# Patient Record
Sex: Female | Born: 1960 | Race: White | Hispanic: No | Marital: Married | Smoking: Former smoker
Health system: Southern US, Community
[De-identification: ages and names within clinical notes are randomized; demographics above are authoritative.]

## PROBLEM LIST (undated history)

## (undated) DIAGNOSIS — R911 Solitary pulmonary nodule: Secondary | ICD-10-CM

## (undated) DIAGNOSIS — H269 Unspecified cataract: Secondary | ICD-10-CM

## (undated) DIAGNOSIS — H04123 Dry eye syndrome of bilateral lacrimal glands: Secondary | ICD-10-CM

## (undated) DIAGNOSIS — Z9889 Other specified postprocedural states: Secondary | ICD-10-CM

## (undated) DIAGNOSIS — N3941 Urge incontinence: Secondary | ICD-10-CM

## (undated) DIAGNOSIS — E785 Hyperlipidemia, unspecified: Secondary | ICD-10-CM

## (undated) DIAGNOSIS — R350 Frequency of micturition: Secondary | ICD-10-CM

## (undated) DIAGNOSIS — M8580 Other specified disorders of bone density and structure, unspecified site: Secondary | ICD-10-CM

## (undated) DIAGNOSIS — Z8719 Personal history of other diseases of the digestive system: Secondary | ICD-10-CM

## (undated) DIAGNOSIS — R351 Nocturia: Secondary | ICD-10-CM

## (undated) DIAGNOSIS — R200 Anesthesia of skin: Secondary | ICD-10-CM

## (undated) DIAGNOSIS — K219 Gastro-esophageal reflux disease without esophagitis: Secondary | ICD-10-CM

## (undated) DIAGNOSIS — R0789 Other chest pain: Secondary | ICD-10-CM

## (undated) DIAGNOSIS — N3010 Interstitial cystitis (chronic) without hematuria: Secondary | ICD-10-CM

## (undated) DIAGNOSIS — G8929 Other chronic pain: Secondary | ICD-10-CM

## (undated) DIAGNOSIS — R519 Headache, unspecified: Secondary | ICD-10-CM

## (undated) DIAGNOSIS — R51 Headache: Secondary | ICD-10-CM

## (undated) DIAGNOSIS — M5030 Other cervical disc degeneration, unspecified cervical region: Secondary | ICD-10-CM

## (undated) DIAGNOSIS — A048 Other specified bacterial intestinal infections: Secondary | ICD-10-CM

## (undated) DIAGNOSIS — M1990 Unspecified osteoarthritis, unspecified site: Secondary | ICD-10-CM

## (undated) HISTORY — DX: Personal history of other diseases of the digestive system: Z87.19

## (undated) HISTORY — PX: OTHER SURGICAL HISTORY: SHX169

## (undated) HISTORY — DX: Other chest pain: R07.89

## (undated) HISTORY — DX: Unspecified cataract: H26.9

## (undated) HISTORY — DX: Other specified bacterial intestinal infections: A04.8

## (undated) HISTORY — PX: EXCISION NEUROMA: SHX6350

## (undated) HISTORY — PX: COLONOSCOPY: SHX174

## (undated) HISTORY — DX: Other specified disorders of bone density and structure, unspecified site: M85.80

## (undated) HISTORY — PX: TOE FUSION: SHX1070

---

## 1979-09-11 HISTORY — PX: TONSILLECTOMY: SUR1361

## 1990-09-10 HISTORY — PX: VAGINAL HYSTERECTOMY: SUR661

## 1997-12-31 ENCOUNTER — Encounter: Payer: Self-pay | Admitting: Gastroenterology

## 1998-12-11 ENCOUNTER — Emergency Department (HOSPITAL_COMMUNITY): Admission: EM | Admit: 1998-12-11 | Discharge: 1998-12-11 | Payer: Self-pay | Admitting: *Deleted

## 1998-12-11 ENCOUNTER — Encounter: Payer: Self-pay | Admitting: *Deleted

## 1998-12-22 ENCOUNTER — Ambulatory Visit (HOSPITAL_COMMUNITY): Admission: RE | Admit: 1998-12-22 | Discharge: 1998-12-22 | Payer: Self-pay | Admitting: Obstetrics and Gynecology

## 1999-09-11 HISTORY — PX: ROTATOR CUFF REPAIR: SHX139

## 2000-03-06 ENCOUNTER — Encounter: Payer: Self-pay | Admitting: Orthopedic Surgery

## 2000-03-06 ENCOUNTER — Ambulatory Visit (HOSPITAL_COMMUNITY): Admission: RE | Admit: 2000-03-06 | Discharge: 2000-03-06 | Payer: Self-pay | Admitting: Orthopedic Surgery

## 2002-05-14 ENCOUNTER — Encounter: Payer: Self-pay | Admitting: Neurosurgery

## 2002-05-14 ENCOUNTER — Ambulatory Visit (HOSPITAL_COMMUNITY): Admission: RE | Admit: 2002-05-14 | Discharge: 2002-05-15 | Payer: Self-pay | Admitting: Neurosurgery

## 2002-05-14 HISTORY — PX: OTHER SURGICAL HISTORY: SHX169

## 2002-06-16 ENCOUNTER — Encounter: Admission: RE | Admit: 2002-06-16 | Discharge: 2002-06-16 | Payer: Self-pay | Admitting: Neurosurgery

## 2002-06-16 ENCOUNTER — Encounter: Payer: Self-pay | Admitting: Neurosurgery

## 2002-09-21 ENCOUNTER — Encounter: Payer: Self-pay | Admitting: Neurosurgery

## 2002-09-21 ENCOUNTER — Encounter: Admission: RE | Admit: 2002-09-21 | Discharge: 2002-09-21 | Payer: Self-pay | Admitting: Neurosurgery

## 2003-03-22 ENCOUNTER — Encounter: Payer: Self-pay | Admitting: Neurosurgery

## 2003-03-22 ENCOUNTER — Encounter: Admission: RE | Admit: 2003-03-22 | Discharge: 2003-03-22 | Payer: Self-pay | Admitting: Neurosurgery

## 2004-04-19 ENCOUNTER — Encounter: Admission: RE | Admit: 2004-04-19 | Discharge: 2004-04-19 | Payer: Self-pay | Admitting: Orthopedic Surgery

## 2004-05-04 ENCOUNTER — Encounter: Admission: RE | Admit: 2004-05-04 | Discharge: 2004-05-04 | Payer: Self-pay | Admitting: Orthopedic Surgery

## 2008-02-24 ENCOUNTER — Encounter: Admission: RE | Admit: 2008-02-24 | Discharge: 2008-02-24 | Payer: Self-pay | Admitting: Neurosurgery

## 2008-03-29 ENCOUNTER — Ambulatory Visit (HOSPITAL_COMMUNITY): Admission: RE | Admit: 2008-03-29 | Discharge: 2008-03-30 | Payer: Self-pay | Admitting: Neurosurgery

## 2008-04-13 ENCOUNTER — Encounter: Admission: RE | Admit: 2008-04-13 | Discharge: 2008-04-13 | Payer: Self-pay | Admitting: Neurosurgery

## 2008-05-16 ENCOUNTER — Encounter: Admission: RE | Admit: 2008-05-16 | Discharge: 2008-05-16 | Payer: Self-pay | Admitting: Neurosurgery

## 2008-06-10 HISTORY — PX: OTHER SURGICAL HISTORY: SHX169

## 2009-02-22 ENCOUNTER — Ambulatory Visit: Payer: Self-pay | Admitting: Gastroenterology

## 2009-03-09 ENCOUNTER — Ambulatory Visit: Payer: Self-pay | Admitting: Gastroenterology

## 2009-03-10 ENCOUNTER — Ambulatory Visit (HOSPITAL_BASED_OUTPATIENT_CLINIC_OR_DEPARTMENT_OTHER): Admission: RE | Admit: 2009-03-10 | Discharge: 2009-03-10 | Payer: Self-pay | Admitting: Urology

## 2009-03-10 HISTORY — PX: OTHER SURGICAL HISTORY: SHX169

## 2009-03-22 ENCOUNTER — Encounter: Admission: RE | Admit: 2009-03-22 | Discharge: 2009-03-22 | Payer: Self-pay | Admitting: Neurosurgery

## 2009-06-16 ENCOUNTER — Encounter: Admission: RE | Admit: 2009-06-16 | Discharge: 2009-06-16 | Payer: Self-pay | Admitting: Neurosurgery

## 2009-06-29 ENCOUNTER — Ambulatory Visit (HOSPITAL_COMMUNITY): Admission: RE | Admit: 2009-06-29 | Discharge: 2009-06-30 | Payer: Self-pay | Admitting: Neurosurgery

## 2009-06-29 HISTORY — PX: POSTERIOR FUSION CERVICAL SPINE: SUR628

## 2009-08-25 ENCOUNTER — Encounter: Admission: RE | Admit: 2009-08-25 | Discharge: 2009-08-25 | Payer: Self-pay | Admitting: Neurosurgery

## 2009-08-27 ENCOUNTER — Encounter: Admission: RE | Admit: 2009-08-27 | Discharge: 2009-08-27 | Payer: Self-pay | Admitting: Neurosurgery

## 2009-09-10 DIAGNOSIS — Z8719 Personal history of other diseases of the digestive system: Secondary | ICD-10-CM

## 2009-09-10 HISTORY — DX: Personal history of other diseases of the digestive system: Z87.19

## 2009-09-22 ENCOUNTER — Encounter: Admission: RE | Admit: 2009-09-22 | Discharge: 2009-09-22 | Payer: Self-pay | Admitting: Neurosurgery

## 2009-12-20 ENCOUNTER — Encounter: Admission: RE | Admit: 2009-12-20 | Discharge: 2009-12-20 | Payer: Self-pay | Admitting: Neurosurgery

## 2009-12-23 ENCOUNTER — Encounter: Admission: RE | Admit: 2009-12-23 | Discharge: 2009-12-23 | Payer: Self-pay | Admitting: Neurosurgery

## 2010-01-06 ENCOUNTER — Encounter: Admission: RE | Admit: 2010-01-06 | Discharge: 2010-01-06 | Payer: Self-pay | Admitting: Neurosurgery

## 2010-01-18 ENCOUNTER — Encounter: Admission: RE | Admit: 2010-01-18 | Discharge: 2010-01-18 | Payer: Self-pay | Admitting: Orthopedic Surgery

## 2010-02-08 ENCOUNTER — Encounter: Admission: RE | Admit: 2010-02-08 | Discharge: 2010-02-08 | Payer: Self-pay | Admitting: Neurosurgery

## 2010-04-06 ENCOUNTER — Encounter: Admission: RE | Admit: 2010-04-06 | Discharge: 2010-04-06 | Payer: Self-pay | Admitting: Neurosurgery

## 2010-06-09 ENCOUNTER — Encounter (INDEPENDENT_AMBULATORY_CARE_PROVIDER_SITE_OTHER): Payer: Self-pay | Admitting: *Deleted

## 2010-07-31 ENCOUNTER — Ambulatory Visit: Payer: Self-pay | Admitting: Gastroenterology

## 2010-07-31 DIAGNOSIS — R1310 Dysphagia, unspecified: Secondary | ICD-10-CM | POA: Insufficient documentation

## 2010-07-31 DIAGNOSIS — R1013 Epigastric pain: Secondary | ICD-10-CM

## 2010-08-01 ENCOUNTER — Telehealth: Payer: Self-pay | Admitting: Gastroenterology

## 2010-08-01 ENCOUNTER — Ambulatory Visit: Payer: Self-pay | Admitting: Gastroenterology

## 2010-08-07 ENCOUNTER — Encounter: Payer: Self-pay | Admitting: Gastroenterology

## 2010-10-01 ENCOUNTER — Encounter: Payer: Self-pay | Admitting: Neurosurgery

## 2010-10-05 ENCOUNTER — Ambulatory Visit
Admission: RE | Admit: 2010-10-05 | Discharge: 2010-10-05 | Payer: Self-pay | Source: Home / Self Care | Attending: Gastroenterology | Admitting: Gastroenterology

## 2010-10-10 NOTE — Letter (Signed)
Summary: EGD Instructions  [facility]  [address] [place], [place] [address]   Phone: [phone]  Fax: [phone]       Brenda PRIOLEAU    1961/05/20    MRN: [id number]       Procedure Day /Date:TUESDAY 08/01/2010     Arrival Time: 10:30AM     Procedure Time:11:30AM     Location of Procedure:                    X  [facility] (4th Floor)   PREPARATION FOR ENDOSCOPY/DIL   On11/22/2011 THE DAY OF THE PROCEDURE:  1.   No solid foods, milk or milk products are allowed after midnight the night before your procedure.  2.   Do not drink anything colored red or purple.  Avoid juices with pulp.  No orange juice.  3.  You may drink clear liquids until 9:30AM  which is 2 hours before your procedure.                                                                                                CLEAR LIQUIDS INCLUDE: Water Jello Ice Popsicles Tea (sugar ok, no milk/cream) Powdered fruit flavored drinks Coffee (sugar ok, no milk/cream) Gatorade Juice: apple, white grape, white cranberry  Lemonade Clear bullion, consomm, broth Carbonated beverages (any kind) Strained chicken noodle soup Hard Candy   MEDICATION INSTRUCTIONS  Unless otherwise instructed, you should take regular prescription medications with a small sip of water as early as possible the morning of your procedure.           OTHER INSTRUCTIONS  You will need a responsible adult at least 52 years of age to accompany you and drive you home.   This person must remain in the waiting room during your procedure.  Wear loose fitting clothing that is easily removed.  Leave jewelry and other valuables at home.  However, you may wish to bring a book to read or an iPod/MP3 player to listen to music as you wait for your procedure to start.  Remove all body piercing jewelry and leave at home.  Total time from sign-in until discharge is approximately 2-3 hours.  You should go home  directly after your procedure and rest.  You can resume normal activities the day after your procedure.  The day of your procedure you should not:   Drive   Make legal decisions   Operate machinery   Drink alcohol   Return to work  You will receive specific instructions about eating, activities and medications before you leave.    The above instructions have been reviewed and explained to me by   _______________________    I fully understand and can verbalize these instructions _____________________________ Date _________

## 2010-10-10 NOTE — Progress Notes (Signed)
Summary: Prior Auth.  Phone Note Call from Patient Call back at Home Phone [phone]   Caller: Patient Call For: Dr. Arlyce Dice Reason for Call: Talk to Nurse Summary of Call: Ins. needs prior auth. for her ACIPHEX  Initial call taken by: Karna Christmas,  August 01, 2010 1:52 PM  Follow-up for Phone Call        Insurance will not pay for Aciphex, unless pt has been on Nexium or Omeprazole. Pt states she has tried Nexium in the past but never been on Omeprazole, Will send her in the rx she will try and call us back if it failes. Then we will do preauth for Aciphex Follow-up by: Merri Ray CMA Duncan Dull),  August 01, 2010 2:33 PM    New/Updated Medications: OMEPRAZOLE 40 MG CPDR (OMEPRAZOLE) 1 by mouth once daily Prescriptions: OMEPRAZOLE 40 MG CPDR (OMEPRAZOLE) 1 by mouth once daily  #30 x 2   Entered by:   Merri Ray CMA (AAMA)   Authorized by:   Louis Meckel MD   Signed by:   Merri Ray CMA (AAMA) on 08/01/2010   Method used:   Electronically to        [facility] [address] [phone]* (retail)       [address]       [address]       [place], [place]  [address]       Ph: [phone]       Fax: [phone]   RxID:   [id number]

## 2010-10-10 NOTE — Letter (Signed)
Summary: Results Letter  [facility]  [address] [place], [place] [address]   Phone: [phone]  Fax: [phone]        July 31, 2010 MRN: [id number]    Brenda Gray [address] [place], [place]  [address]    Dear Ms. Yost,  It is my pleasure to have treated you recently as a new patient in my office. I appreciate your confidence and the opportunity to participate in your care.  Since I do have a busy inpatient endoscopy schedule and office schedule, my office hours vary weekly. I am, however, available for emergency calls everyday through my office. If I am not available for an urgent office appointment, another one of our gastroenterologist will be able to assist you.  My well-trained staff are prepared to help you at all times. For emergencies after office hours, a physician from our Gastroenterology section is always available through my 24 hour answering service  Once again I welcome you as a new patient and I look forward to a happy and healthy relationship             Sincerely,  Louis Meckel MD  This letter has been electronically signed by your physician.  Appended Document: Results Letter letter mailed

## 2010-10-10 NOTE — Letter (Signed)
Summary: Results Letter  [facility]  [address] [place], [place] [address]   Phone: [phone]  Fax: [phone]        August 07, 2010 MRN: [id number]    Brenda Gray [address] [place], [place]  [address]    Dear Ms. 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  This is associated with recurrent inflammation of the stomach and duodenum, and recurrent ulcer disease.  My nurse will be calling in a prescription for treatment.            Sincerely,  Louis Meckel MD  This letter has been electronically signed by your physician.  Appended Document: Results Letter letter mailed  Appended Document: Results Letter Called pt to inform Med sent to her pharmacy, Mailed letter   Clinical Lists Changes  Medications: Added new medication of PREVPAC  MISC (AMOXICILL-CLARITHRO-LANSOPRAZ) as directed - Signed Rx of PREVPAC  MISC (AMOXICILL-CLARITHRO-LANSOPRAZ) as directed;  #1 x 0;  Signed;  Entered by: Merri Ray CMA (AAMA);  Authorized by: Louis Meckel MD;  Method used: Electronically to [facility] [address] [phone]*, [address], [place], [place]  [address], Ph: [phone], Fax: [phone]    Prescriptions: PREVPAC  MISC (AMOXICILL-CLARITHRO-LANSOPRAZ) as directed  #1 x 0   Entered by:   Merri Ray CMA (AAMA)   Authorized by:   Louis Meckel MD   Signed by:   Merri Ray CMA (AAMA) on 08/07/2010   Method used:   Electronically to        [facility] [address] [phone]* (retail)       [address]       [address]       [place], [place]  [address]       Ph: [phone]       Fax: [phone]   RxID:   [id number]

## 2010-10-10 NOTE — Letter (Signed)
Summary: New Patient letter  [facility]  [address] [place], [place] [address]   Phone: [phone]  Fax: [phone]       06/09/2010 MRN: [id number]  Brenda Gray [address] [place], [place]  [address]  Dear Brenda Gray,  Welcome to the Gastroenterology Division at [facility]    You are scheduled to see Dr.  Juanda Chance on 07-31-10 at 8:30a.m. on the 3rd floor at [facility], [address]  We ask that you try to arrive at our office 15 minutes prior to your appointment time to allow for check-in.  We would like you to complete the enclosed self-administered evaluation form prior to your visit and bring it with you on the day of your appointment.  We will review it with you.  Also, please bring a complete list of all your medications or, if you prefer, bring the medication bottles and we will list them.  Please bring your insurance card so that we may make a copy of it.  If your insurance requires a referral to see a specialist, please bring your referral form from your primary care physician.  Co-payments are due at the time of your visit and may be paid by cash, check or credit card.     Your office visit will consist of a consult with your physician (includes a physical exam), any laboratory testing he/she may order, scheduling of any necessary diagnostic testing (e.g. x-ray, ultrasound, CT-scan), and scheduling of a procedure (e.g. Endoscopy, Colonoscopy) if required.  Please allow enough time on your schedule to allow for any/all of these possibilities.    If you cannot keep your appointment, please call [phone] to cancel or reschedule prior to your appointment date.  This allows [place] the opportunity to schedule an appointment for another patient in need of care.  If you do not cancel or reschedule by 5 p.m. the business day prior to your appointment date, you will be charged a $50.00 late cancellation/no-show fee.    Thank you for  choosing  Gastroenterology for your medical needs.  We appreciate the opportunity to care for you.  Please visit [place] at our website  to learn more about our practice.                     Sincerely,                                                             The Gastroenterology Division

## 2010-10-10 NOTE — Progress Notes (Signed)
Summary: [facility]   Imported By: Sherian Rein 08/01/2010 07:17:02  _____________________________________________________________________  External Attachment:    Type:   Image     Comment:   External Document

## 2010-10-10 NOTE — Procedures (Signed)
Summary: Upper Endoscopy  Patient: [name] Note: All result statuses are Final unless otherwise noted.  Tests: (1) Upper Endoscopy (EGD)   EGD Upper Endoscopy       DONE     [facility]     [address]     [place], [place]  [address]           ENDOSCOPY PROCEDURE REPORT           PATIENT:  Brenda Gray, Brenda Gray  MR#:  [id number]     BIRTHDATE:  1961-01-08, 49 yrs. old  GENDER:  female           ENDOSCOPIST:  Barbette Hair. Arlyce Dice, MD     Referred by:           PROCEDURE DATE:  08/01/2010     PROCEDURE:  EGD with biopsy, [address], Maloney Dilation of Esophagus     ASA CLASS:  Class I     INDICATIONS:  abdominal pain, dysphagia           MEDICATIONS:   Fentanyl 75 mcg IV, Versed 8 mg IV, glycopyrrolate     (Robinal) 0.2 mg IV, 0.6cc simethancone 0.6 cc PO     TOPICAL ANESTHETIC:  Exactacain Spray           DESCRIPTION OF PROCEDURE:   After the risks benefits and     alternatives of the procedure were thoroughly explained, informed     consent was obtained.  The LB GIF-H180 [id number] endoscope was     introduced through the mouth and advanced to the third portion of     the duodenum, without limitations.  The instrument was slowly     withdrawn as the mucosa was fully examined.     <<PROCEDUREIMAGES>>           Multiple ulcers were found in the antrum. 3 partially healed     superficial prepyloric ulcers with moderate surrounding inflamed     mucosa; bxs taken (see image6 and image7).  Duodenitis was found     (see image4).  irregular Z-line at the gastroesophageal junction     (see image2 and image1). Bxs taken to r/o Barrett's Dilation with     maloney dilator 18mm Minimal resistance; no heme  Otherwise the     examination was normal.    Retroflexed views revealed no     abnormalities.    The scope was then withdrawn from the patient     and the procedure completed.           COMPLICATIONS:  None           ENDOSCOPIC IMPRESSION:     1) Ulcers, multiple in the  antrum     2) Duodenitis     3) Irregular Z-line at the gastroesophageal junction - r/o     Barrett's     4) s/p maloney dilitation for dysphagia     5) Otherwise normal examination     RECOMMENDATIONS:     1) Aciphex 20 mg qd     2) Await biopsy results     3) Call office next 2-3 days to schedule an office appointment     for 1 month           REPEAT EXAM:  No           ______________________________     Barbette Hair. Arlyce Dice, MD           CC:  Jarome Matin, MD  n.     eSIGNED:   Barbette Hair. Amarien Carne at 08/01/2010 12:42 PM           Weir, Rosey Bath, [id number]  Note: An exclamation mark (!) indicates a result that was not dispersed into the flowsheet. Document Creation Date: 08/01/2010 12:42 PM _______________________________________________________________________  (1) Order result status: Final Collection or observation date-time: 08/01/2010 12:35 Requested date-time:  Receipt date-time:  Reported date-time:  Referring Physician:   Ordering Physician: Melvia Heaps [phone]) Specimen Source:  Source: Launa Grill Order Number: [id number] Lab site:

## 2010-10-12 NOTE — Assessment & Plan Note (Signed)
Summary: nausea//gastric problems--ch.   History of Present Illness Visit Type: Initial Visit Primary GI MD: Melvia Heaps MD [facility] Primary Provider: Jarome Matin, MD Chief Complaint: Patient complains that she is having some epigastric pain and nausea. Which has been better lately but she still has it. She is taking Pepcid two a day. The epigastric pain wakes her at night. She complains of her throat closing up but she does have a nodule on her thyroid.  History of Present Illness:   Brenda Gray is a pleasant 50 yo WFreferred request of Dr. Eloise Harman for evaluation of abdominal pain.  For several weeks she has been complaining of upper midepigastric pain that  is slightly lessened with eating.  She has had mild nausea and complains of pyrosis.  She also has dysphagia to solids and liquids.  She is on no gastric irritants including nonsteroidals.  Symptoms have improved though remained despite taking Pepcid twice a day.  She is on no gastric irritants including nonsteroidals.  She status post 2 surgeries on her cervical spine.   GI Review of Systems    Reports abdominal pain, acid reflux, dysphagia with liquids, dysphagia with solids, and  loss of appetite.     Location of  Abdominal pain: epigastric area.    Denies belching, bloating, chest pain, heartburn, nausea, vomiting, vomiting blood, weight loss, and  weight gain.      Reports constipation.     Denies anal fissure, black tarry stools, change in bowel habit, diarrhea, diverticulosis, fecal incontinence, heme positive stool, hemorrhoids, irritable bowel syndrome, jaundice, light color stool, liver problems, rectal bleeding, and  rectal pain. Preventive Screening-Counseling & Management  Alcohol-Tobacco     Smoking Status: quit      Drug Use:  no.      Current Medications (verified): 1)  Pepcid Complete 10-800-165 Mg Chew (Famotidine-Ca Carb-Mag Hydrox) .... Take One By Mouth Two Times A Day 2)  Halcion 0.25 Mg Tabs (Triazolam)  .... Take One By Mouth At Bedtime 3)  Pravastatin .... Take One By Mouth Once Daily 4)  Spinal Injections .... As Needed  Allergies (verified): No Known Drug Allergies  Past History:  Past Medical History: GERD Anemia Arthritis Chronic Headaches Hyperlipidemia Interstitial Cystitis Kidney Stones Urinary Tract Infection Basel Cell  Past Surgical History: Tubal Ligation Oral Surgery anterior cervical diskectomy with fusion posterior cervical diskectomy cages and screws bilateral rotator cuff spinal injections lower  Family History: Family History of Heart Disease: mother No FH of Colon Cancer:  Social History: Occupation: typesetter- Magazine features editor Married one son Patient is a former smoker.  Alcohol Use - no Illicit Drug Use - no Smoking Status:  quit Drug Use:  no  Review of Systems       The patient complains of arthritis/joint pain, back pain, change in vision, headaches-new, sore throat, and thirst - excessive.  The patient denies allergy/sinus, anemia, anxiety-new, blood in urine, breast changes/lumps, confusion, cough, coughing up blood, depression-new, fainting, fatigue, fever, hearing problems, heart murmur, heart rhythm changes, itching, menstrual pain, muscle pains/cramps, night sweats, nosebleeds, pregnancy symptoms, shortness of breath, skin rash, sleeping problems, swelling of feet/legs, swollen lymph glands, thirst - excessive , urination - excessive , urination changes/pain, urine leakage, vision changes, and voice change.         All other systems were reviewed and were negative   Vital Signs:  Patient profile:   50 year old female Height:      65 inches Weight:  129.8 pounds BMI:     21.68 Pulse rate:   76 / minute Pulse rhythm:   regular BP sitting:   114 / 64  (left arm) Cuff size:   regular  Vitals Entered By: Harlow Mares CMA Duncan Dull) (July 31, 2010 8:45 AM)  Physical Exam  Additional Exam:  On physical exam she is a  well-developed well-nourished female  skin: anicteric HEENT: normocephalic; PEERLA; no nasal or pharyngeal abnormalities neck: supple nodes: no cervical lymphadenopathy chest: clear to ausculatation and percussion heart: no murmurs, gallops, or rubs abd: soft, nontender; BS normoactive; no abdominal masses, tenderness, organomegaly rectal: deferred ext: no cynanosis, clubbing, edema skeletal: no deformities neuro: oriented x 3; no focal abnormalities    Impression & Recommendations:  Problem # 1:  ABDOMINAL PAIN, EPIGASTRIC (ICD-789.06)  Symptoms could be due to ulcer or nonulcer dyspepsia.  Recommendations #1 switch from Pepcid to AcipHex 20 mg daily #2 upper endoscopy  Orders: EGD (EGD)  Problem # 2:  DYSPHAGIA UNSPECIFIED (ICD-787.20)  This could be due to a fixed esophageal stricture vs. extrinsic compression related to her prior cervical spine surgeries.  Recommendations #1 upper endoscopy with dilatation as indicated  Risks, alternatives, and complications of the procedure, including bleeding, perforation, and possible need for surgery, were explained to the patient.  Patient's questions were answered.  Orders: EGD (EGD)  Patient Instructions: 1)  Copy sent to : Jarome Matin, MD 2)  Discontinue Pepcid start Aciphex 3)  Conscious Sedation brochure given.  4)  Upper Endoscopy brochure given.  5)  Upper Endoscopy with Dilatation brochure given.  6)  Your EGD is scheduled on 08/01/2010 at 11:30am 7)  The medication list was reviewed and reconciled.  All changed / newly prescribed medications were explained.  A complete medication list was provided to the patient / caregiver. Prescriptions: ACIPHEX 20 MG TBEC (RABEPRAZOLE SODIUM) take one tab daily before breakfast  #30 x 1   Entered and Authorized by:   Louis Meckel MD   Signed by:   Louis Meckel MD on 07/31/2010   Method used:   Electronically to        [facility] [address] [phone]* (retail)        [address]       [address]       [place], [place]  [address]       Ph: [phone]       Fax: [phone]   RxID:   [id number]

## 2010-10-18 NOTE — Assessment & Plan Note (Signed)
Summary: f/u from procedure--ch.   History of Present Illness Visit Type: Follow-up Visit Primary GI MD: Melvia Heaps MD [facility] Primary Provider: Jarome Matin, MD Chief Complaint: Pt still having a few problems , was prescribed Neurontin and Lyrica by another doctor but she hasnt started taking them yet History of Present Illness:   Brenda Gray has returned following  upper endoscopy and dilatation of a distal esophageal stricture.She had multiple superficial ulcers in her gastric antrum and mild duodenitis.  She was treated with a Prevpac for H. pylori.  At this time she is taking only omeprazole.  Except for mild "hunger pains" which is described as a gnawing discomfort she is feeling well.  She has no other specific GI complaints.   GI Review of Systems      Denies abdominal pain, acid reflux, belching, bloating, chest pain, dysphagia with liquids, dysphagia with solids, heartburn, loss of appetite, nausea, vomiting, vomiting blood, weight loss, and  weight gain.        Denies anal fissure, black tarry stools, change in bowel habit, constipation, diarrhea, diverticulosis, fecal incontinence, heme positive stool, hemorrhoids, irritable bowel syndrome, jaundice, light color stool, liver problems, rectal bleeding, and  rectal pain.    Current Medications (verified): 1)  Halcion 0.25 Mg Tabs (Triazolam) .... Take One By Mouth At Bedtime 2)  Pravastatin .... Take One By Mouth Once Daily 3)  Spinal Injections .... As Needed 4)  Omeprazole 40 Mg Cpdr (Omeprazole) .Marland Kitchen.. 1 By Mouth Once Daily 5)  Prevpac  Misc (Amoxicill-Clarithro-Lansopraz) .... As Directed  Allergies (verified): No Known Drug Allergies  Past History:  Past Medical History: Last updated: 07/31/2010 GERD Anemia Arthritis Chronic Headaches Hyperlipidemia Interstitial Cystitis Kidney Stones Urinary Tract Infection Basel Cell  Past Surgical History: Last updated: 07/31/2010 Tubal Ligation Oral  Surgery anterior cervical diskectomy with fusion posterior cervical diskectomy cages and screws bilateral rotator cuff spinal injections lower  Family History: Last updated: 07/31/2010 Family History of Heart Disease: mother No FH of Colon Cancer:  Social History: Last updated: 07/31/2010 Occupation: typesetter- Editor, 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  Alcohol Use - no Illicit Drug Use - no  Review of Systems  The patient denies allergy/sinus, anemia, anxiety-new, arthritis/joint pain, back pain, blood in urine, breast changes/lumps, change in vision, confusion, cough, coughing up blood, depression-new, fainting, fatigue, fever, headaches-new, hearing problems, heart murmur, heart rhythm changes, itching, menstrual pain, muscle pains/cramps, night sweats, nosebleeds, pregnancy symptoms, shortness of breath, skin rash, sleeping problems, sore throat, swelling of feet/legs, swollen lymph glands, thirst - excessive , urination - excessive , urination changes/pain, urine leakage, vision changes, and voice change.    Vital Signs:  Patient profile:   50 year old female Height:      65 inches Weight:      131 pounds BMI:     21.88 BSA:     1.65 Pulse rate:   62 / minute Pulse rhythm:   regular BP sitting:   118 / 72  (left arm)  Vitals Entered By: Merri Ray CMA Duncan Dull) (October 05, 2010 1:54 PM)   Impression & Recommendations:  Problem # 1:  DYSPHAGIA UNSPECIFIED (ICD-787.20) Assessment Improved Repeat Dilatation P.R.N.  Problem # 2:  ABDOMINAL PAIN, EPIGASTRIC (ICD-789.06) Assessment: Improved Plan to discontinue omeprazole.  She will take hyomax estimated for abdominal discomfort.  Patient Instructions: 1)  Copy sent to : Brenda Matin, MD 2)  Your prescription is being sent to your pharmacy today 3)  Call back  as needed  4)  The medication list was reviewed and reconciled.  All changed / newly prescribed medications were explained.  A  complete medication list was provided to the patient / caregiver. Prescriptions: HYOMAX-FT 0.125 MG TBDP (HYOSCYAMINE SULFATE) take 1-2 tabs sublingual q.4 h. p.r.n.  #15 x 1   Entered and Authorized by:   Brenda Meckel MD   Signed by:   Brenda Meckel MD on 10/05/2010   Method used:   Electronically to        [facility] [address] [phone]* (retail)       [address]       [address]       [place], [place]  [address]       Ph: [phone]       Fax: [phone]   RxID:   [id number]

## 2010-12-14 LAB — CBC
HCT: 43.7 % (ref 36.0–46.0)
Hemoglobin: 14.6 g/dL (ref 12.0–15.0)
MCHC: 33.4 g/dL (ref 30.0–36.0)
RBC: 4.93 MIL/uL (ref 3.87–5.11)
RDW: 13.4 % (ref 11.5–15.5)

## 2010-12-14 LAB — BASIC METABOLIC PANEL
CO2: 30 mEq/L (ref 19–32)
Calcium: 10.1 mg/dL (ref 8.4–10.5)
GFR calc Af Amer: >60 mL/min (ref >60)
GFR calc non Af Amer: >60 mL/min (ref >60)
Glucose, Bld: 92 mg/dL (ref 70–99)
Potassium: 4.7 mEq/L (ref 3.5–5.1)
Sodium: 139 mEq/L (ref 135–145)

## 2011-01-23 NOTE — Op Note (Signed)
Brenda Gray, Brenda Gray           ACCOUNT NO.:  [id number]   MEDICAL RECORD NO.:  [id number]          PATIENT TYPE:  AMB   LOCATION:  [facility]                         FACILITY:  [facility]   PHYSICIAN:  Excell Seltzer. Annabell Howells, M.D.    DATE OF BIRTH:  07-20-1961   DATE OF PROCEDURE:  03/10/2009  DATE OF DISCHARGE:                               OPERATIVE REPORT   PROCEDURE:  Cystoscopy, hydrodistention of the bladder, urethral  dilation, installation of Pyridium and Marcaine.   PREOPERATIVE DIAGNOSIS:  Painful bladder with frequency and urgency.   POSTOPERATIVE DIAGNOSIS:  Interstitial cystitis.   SURGEON:  Dr. Bjorn Pippin   ANESTHESIA:  General.   SPECIMEN:  None.   COMPLICATIONS:  None.   INDICATIONS:  [name] is a 50 year old white female with a 2-year  history of urgency and frequency without pain.  She has noted dietary  triggers and it is felt she may have interstitial cystitis.   FINDINGS AT PROCEDURE:  The patient was given Cipro.  She was taken to  the operating room where general anesthetic was induced.  She was placed  in lithotomy position.  Her perineum and genitalia were prepped with  Betadine solution in the usual sterile fashion.  A time-out was  performed.  A cysto was performed with a 22-French scope and 70-degree  lens.  Examination revealed a normal urethra.  The bladder wall had mild  trabeculation and increased vascular density without lesions.  The  ureteral orifices were unremarkable.  After thorough cystoscopy, the  bladder was filled under 80 cm of water pressure to capacity.  She had  some muscular contractions early suggesting a sensitive bladder that  required deepening of the anesthesia.  Eventually, she was filled to a  capacity of 800 mL.  Upon draining the bladder, the terminal efflux was  bloody.  Re-cystoscopy after hydrodistention revealed diffuse severe  glomerulations but no evidence of ulcerations or bladder wall tearing.   At this point, the  urethra was dilated to 30-French with female sounds.  She was little tight at 26.  Once the urethral dilation had been  performed, the bladder was instilled with 30 cc of 0.25% Marcaine with  400 mg crushed Pyridium.  A B and O suppository was placed.  She was  taken down from lithotomy position.  Her anesthetic was reversed.  She was removed to the recovery room in stable condition.  There were no  complications.  She has followup with me on 7/16 and will be given  Cipro, Vicodin, and Pyridium for postoperative management and will be  started on Elavil 10 mg 1-2 tablets at bedtime and Elmiron 100 mg p.o.  t.i.d. for her probable interstitial cystitis.      Excell Seltzer. Annabell Howells, M.D.  Electronically Signed     JJW/MEDQ  D:  03/10/2009  T:  03/10/2009  Job:  [id number]   cc:   Barry Dienes. Eloise Harman, M.D.  Fax: [phone]

## 2011-01-23 NOTE — Op Note (Signed)
NAMELAURANN, MCMORRIS NO.:  [id number]   MEDICAL RECORD NO.:  [id number]          PATIENT TYPE:  OIB   LOCATION:  3599                         FACILITY:  [facility]   PHYSICIAN:  Donalee Citrin, M.D.        DATE OF BIRTH:  09/05/1961   DATE OF PROCEDURE:  03/29/2008  DATE OF DISCHARGE:                               OPERATIVE REPORT   PREOPERATIVE DIAGNOSIS:  Cervical spondylosis with spondylitic  radiculopathy with severe stenosis at C3-4 and C4-5, instability and  spinal deformity at these levels.   PROCEDURE:  1. Anterior cervical diskectomy and fusion at C3-4 and C4-5 using a 5-      mm allograft wedge at C3-4, a 6 mm at C4-5, and a 40-mm Venture      plate with six 09-WJ variable angle screws.  2. Removal of hardware and exploration of fusion at C5-C7 with removal      of Atlantis plate.   SURGEON:  Donalee Citrin, MD   ASSISTANT:  Kathaleen Maser. Pool, MD   ANESTHESIA:  General endotracheal.   HISTORY OF PRESENT ILLNESS:  The patient is a very pleasant 50 year old  female with a progressive worsening neck and predominantly left shoulder  and arm pain that has been getting worse over the last several weeks and  months.  She had undergone previous ACDF at C5-C7 back approximately 5  years ago.  Repeat imaging showed significant kyphosis with kyphotic  deformity overlying the plate at C5 with severe spondylitic compression  of the spinal cord and biforaminal stenosis at C3-4 and C4-5.  The  patient then failed all forms of conservative management but was  recommended re-exploration and fusion at C5-C7, removal of hardware and  ACDF at C3-4 and C4-5.  Risks and benefits of the operation were  explained to the patient.  The patient understood and agreed to proceed  forward.   PROCEDURE IN DETAIL:  The patient was brought to the OR and induced  under general anesthesia, placed in supine, and the neck was flexed in  extension and placed on 5 pounds of halter traction.  The  right side of  her neck was prepped and draped in the usual sterile fashion.  Her old  incision was examined under fluoroscopy and felt to be too low for the  new operation, so an incision was made superior to this.  A  curvilinear  incision was made just off the midline to the anterior border of the  sternocleidomastoid muscle.  The superficial layer of the platysma was  dissected down and divided longitudinally.  The avascular plane between  the sternocleidomastoid and strap muscles was developed down to the  prevertebral fascia.  Prevertebral fascia was then dissected with  Kitners.  The old plate was immediately visualized.  The scar tissue was  dissected off the old plate.  The locking mechanisms were engaged.  The  screws were removed and the plate was removed.  The fusion at C5 and C7  was noted to be solid.  The disk space at C4-5 was overlying the top of  the plate with a large  anterior osteophyte.  This was bitten off with a  Leksell rongeur and annulotomy was made into that disk space.  Then the  vertebral body was burned off to the superior margin of the C3-4 disk  space.  The longus colli was then reflected laterally and the disk space  was incised here.  Self-retaining retractor was placed and was cleaned  out further with 3-mm Kerrison punch removing anterior osteophytes.  A  high-speed drill was used to drill down the disk space to posterior  annulus and osteophyte complexes.  At this point, the operating  microscope was draped and brought into the field.  Under microscopic  illumination, the inner surface of the endplates of both C4 and C5 were  aggressively underbitten and removing the posterior margin of the  annulus.  There was remarked osteophytic compression of the spinal cord  and biforaminal stenosis predominantly at C4 vertebral body.  This was  aggressively underbitten decompressing the central canal.  Both C5  pedicles were palpated.  There was a very large bony  spur on the left  side of the spinal canal causing proximal compression of the left C5  nerve root.  This was all aggressively underbitten until the C5 nerve  was skeletonized and flushed with a pedicle and decompressed.  Then the  endplates were scraped.  Gelfoam was placed.  Attention was taken to C3-  4.  After the right C5 nerve root was also skeletonized and decompressed  at C3-4 in similar fashion, central decompression was begun with  aggressive underbiting of both C3 and C4.  The PLL was removed in a  piecemeal fashion.  Aggressive underbiting of both endplates  decompressed the central canal.  Both proximal C4 pedicles were  identified and both C4 nerve roots were skeletonized first with a  pedicle.  After adequate decompression had been achieved and explored  with an angled nerve hook and aggressive underbiting of both posterior  bone spurs were achieved, this endplate was scraped and a 5-mm graft was  sized and selected and inserted 1-mm deep to the anterior vertebral line  and 6-mm graft was then inserted at C4-5.  Then a Venture plate was  placed, sized up to a 40, old screw holes were used, and five with two  13-mm variable angle screws were placed.  All screws had excellent  purchase.  Locking mechanism was engaged.  Then new screw was replaced  in C3 and C4.  All screws were again engaged and had excellent purchase.  Meticulous hemostasis was maintained.  A drain was placed to the  extensive dissection approximately across the 4 cervical level and the  platysma was reapproximated with interrupted Vicryl and the skin was  closed with running 4-0 subcuticular.  Benzoin and Steri-Strips were  applied.  The patient went to the recovery room in stable condition.  At  the end of the case, needle, sponge, and instrument counts were correct.           ______________________________  Donalee Citrin, M.D.     GC/MEDQ  D:  03/29/2008  T:  03/30/2008  Job:  [id number]

## 2011-01-26 NOTE — Op Note (Signed)
NAME:  Brenda Gray, Brenda Gray                     ACCOUNT NO.:  [id number]   MEDICAL RECORD NO.:  [id number]                   PATIENT TYPE:  OIB   LOCATION:  3008                                 FACILITY:  [facility]   PHYSICIAN:  Donzetta Sprung. Wynetta Emery, M.D.                  DATE OF BIRTH:  05-14-1961   DATE OF PROCEDURE:  05/14/2002  DATE OF DISCHARGE:                                 OPERATIVE REPORT   PREOPERATIVE DIAGNOSIS:  Cervical spondylosis with C6-C7 radiculopathy, left  greater than right.   OPERATION:  Endocervical diskectomy with fusion at C5-6 and C6-7 with  patellar wedge 6 mm at C5-6 and 7 mm at C607, 40 mm Atlantis plate with six  13 mm screws.   SURGEON:  Donzetta Sprung. Wynetta Emery, M.D.   ASSISTANT:  Reinaldo Meeker, M.D.   ANESTHESIA:  General endotracheal.   HISTORY OF PRESENT ILLNESS:  The patient is a 50 year old female who had  long standing neck and left greater than right arm pain that has been  refractive to conservative treatment with physical therapy and anti-  inflammatories and had progressive worsening and weakness of her triceps  about 4/5.  CT scan showed severe cervical spondylosis with disc and spur  compression and bulge at nerve roots at C5-6 and C6-7 and bulge at C7 and C6-  7.  The patient was recommended endocervical diskectomy with fusion.   DESCRIPTION OF PROCEDURE:  The patient was brought to the operating room.  General anesthesia.  Supine with a shoulder roll and neck in flexed, and 5  pounds of Holter traction.  The right side of the neck was prepped and  draped in a sterile fashion.  A curvilinear incision was made just out the  midline to sternocleidomastoid after localization with an x-ray.  Superficially the latissimus fascia longitudinally and the  sternocleidomastoid and strap muscles.  The omohyoid was drilled to fascia  ties and Kitners.  Confirmed localization of the C6-7 disc space.  The  longus colli was then reflected laterally at C6-7 and C5-6  with annulotomy  made with 15 blade scalpel at C6-7 to mark the disc space.  Then the  remainder of the longus was reflected, isolated at C5-6 and C6-7 disc space.  The self retaining retractors were placed.  Annulotomy was made both at the  C5-6 and C6-7.  Pituitary rongeurs were used to remove the anterior  vertebral bodies and anterior osteophytes were bitten with Kerrison punch.  Using high speed drill, the end plates were drilled down.  Disc was drilled  out on the posterior osteophyte and posterior longitudinal ligament.  Operating microscope was draped and brought into the field.  Under  microscopic magnification the osteophtye was removed at C5-6 spur.  Large  osteophyte and spur removed from the left and right C6 nerve root,  decompressing both nerves on both sides with angle.  Again with  decompression both nerve  root and neuroforamen were widely patent.  Gelfoam  was laid in the disc space.  Then at C6-7 the procedure was repeated.  The  ligaments were removed in piece meal fashion.  There was very large  centralized as well as hypertrophy compressing the right C6-7 nerve root as  well as left.  Disc are removed and decompressed.  Both neuroforamen are  widely patent at the end of the diskectomy.  Then 6 and 7 mm patellar wedges  were sized to 2 mm, cut off at depth and inserted.  A 7 mm at C6-7 and 6 mm  at C5-6.  Then the anterior vertebral bodies were prepared to receive the  plate.  A 40 mm Atlantis plate was size selected and drilled, tapped and six  13 mm screws were inserted.  At the end of the case hemostasis was  maintained.  The wound was copiously irrigated with normal saline.  Postoperative x-ray confirmed  good placement of bone graft and screws.  Wound was reapproximated with 3-0  interrupted Vicryl.  The skin was closed with running 4-0 subcuticular.  Benzoin dressings were applied.  The patient went to the recovery room in  stable condition.  At the end of the  case needle, instrument and sponge  counts were correct.                                               Jillyn Hidden P. Wynetta Emery, M.D.    GPC/MEDQ  D:  05/14/2002  T:  05/14/2002  Job:  [id number]

## 2011-04-26 ENCOUNTER — Other Ambulatory Visit: Payer: Self-pay | Admitting: Dermatology

## 2011-06-08 LAB — BASIC METABOLIC PANEL
CO2: 27
Chloride: 103
GFR calc Af Amer: >60
Glucose, Bld: 86
Sodium: 139

## 2011-06-08 LAB — CBC
Hemoglobin: 13.8
MCHC: 34
MCV: 86.3
RBC: 4.71
RDW: 13.4

## 2011-07-17 ENCOUNTER — Encounter (HOSPITAL_BASED_OUTPATIENT_CLINIC_OR_DEPARTMENT_OTHER): Payer: Self-pay | Admitting: *Deleted

## 2011-07-17 NOTE — Progress Notes (Signed)
NPO after MN. Pt to arrive 0815. Needs hg and ekg. May take vicodin if needed am of surg. W/ sip of water.

## 2011-07-18 NOTE — H&P (Signed)
Active Problems Problems  1. Chronic Interstitial Cystitis 595.1 2. Urge Incontinence Of Urine 788.31  History of Present Illness   Brenda Gray returns today for cystoscopy with HOD.  She presented recently with recurrent symptoms over the last 6 months.  She has frequency and urgency.  She also has some UUI.  She has no SUI.  She doesn't require pads except in an exercise class.  She has no dysuria or hematuria.  She has nocturia x 1-2.  She has a good stream and is emptying ok.  She has a history of IC with an HOD in 2010.  She had been on Elmiron and Elavil but she is off of both now.   She is off of Neurontin that she took following a cervical disc surgery.  She has some hydrocodone that she takes rarely for pain related to her prior surgery and she has halcion for sleep.   Past Medical History Problems  1. History of  Bladder Irrigation 2. History of  Esophageal Reflux 530.81 3. History of  Feelings Of Urinary Urgency 788.63 4. History of  Gastric Ulcer 531.90 5. History of  Hypercholesterolemia 272.0 6. History of  Nephrolithiasis V13.01 7. History of  Stress Incontinence 788.39 8. History of  Urinary Frequency 788.41  Surgical History Problems  1. History of  Cervical Vertebral Fusion 2. History of  Cystoscopy With Dilation Of Bladder 3. History of  Hysterectomy V45.77 4. History of  Neck Surgery 5. History of  Rotator Cuff Repair 6. History of  Rotator Cuff Repair  Current Meds 1. Calcium TABS; Therapy: (Recorded:23Oct2012) to 2. Multi-Vitamin Oral Tablet; Therapy: (Recorded:23Oct2012) to 3. Pravastatin Sodium 40 MG Oral Tablet; Therapy: (Recorded:17Jun2010) to 4. Triazolam 0.25 MG Oral Tablet; Therapy: (Recorded:23Oct2012) to 5. Vicodin TABS; Therapy: (Recorded:17Jun2010) to  Allergies No Known Allergies  1. No Known Allergies  Family History Problems  1. Family history of  Family Health Status Number Of Children 1 son 2. Maternal history of  Hypertension V17.49 3.  Maternal history of  Nephrolithiasis 4. Fraternal history of  Nephrolithiasis  Social History Problems  1. Caffeine Use 2. Former Smoker smoked 1 pack per week for 20 yrs and quit 3 yrs ago 3. 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 History of  Alcohol Use  Review of Systems  Gastrointestinal: no abdominal pain, no diarrhea and no constipation.  Constitutional: no recent weight loss.  Cardiovascular: no chest pain.  Respiratory: no shortness of breath.  Musculoskeletal: upper back pain (in the neck and into the left arm).  Neurological: (She has left sciatica as well and has some numbness in the leg and foot that is managed by a steroid injection).    Vitals Vital Signs [Data Includes: Last 1 Day]  23Oct2012 12:38PM  BMI Calculated: 21.68 BSA Calculated: 1.62 Height: 5 ft 4 in Weight: 127 lb  Blood Pressure: 106 / 64 Temperature: 98.3 F Heart Rate: 67  Physical Exam Constitutional: Well nourished and well developed . No acute distress.  Pulmonary: No respiratory distress and normal respiratory rhythm and effort.  Cardiovascular: Heart rate and rhythm are normal . No peripheral edema.    Results/Data Urine [Data Includes: Last 1 Day]  23Oct2012  COLOR: YELLOW  Reference Range YELLOW APPEARANCE: CLEAR  Reference Range CLEAR SPECIFIC GRAVITY: 1.030  Reference Range 1.005-1.030 pH: 5.0  Reference Range 5.0-8.0 GLUCOSE: NEG mg/dL Reference Range NEG BILIRUBIN: NEG  Reference Range NEG KETONE: NEG mg/dL Reference Range NEG BLOOD: SMALL  Abnormal Reference Range NEG PROTEIN: NEG mg/dL Reference Range NEG UROBILINOGEN: 0.2 mg/dL Reference Range 1.6-1.0 NITRITE: NEG  Reference Range NEG LEUKOCYTE ESTERASE: NEG  Reference Range NEG SQUAMOUS EPITHELIAL/HPF: FEW  Reference Range RARE WBC: 0-3 WBC/hpf Reference Range <4 RBC: NONE SEEN RBC/hpf Reference Range <4 BACTERIA: NONE SEEN  Reference Range RARE CRYSTALS: NONE  SEEN  Reference Range NEG CASTS: NONE SEEN  Reference Range NEG  Assessment Assessed  1. Chronic Interstitial Cystitis 595.1 2. Urge Incontinence Of Urine 788.31   Nicloe has recurrent bladder symptoms consistent with recurrent IC.   She got a good response to HOD in 6/10.   Plan Chronic Interstitial Cystitis (595.1)  1. Uribel 118 MG Oral Capsule; take 1 po qid prn burning; Therapy: 23Oct2012 to (Last  Rx:23Oct2012) 2. Uribel 118 MG Oral Capsule; take 1 po qid prn burning; Therapy: 23Oct2012 to (Last  Rx:23Oct2012) 3. Follow-up Schedule Surgery Office  Follow-up  Requested for: 23Oct2012 Health Maintenance (V70.0)  4. UA With REFLEX  Done: 23Oct2012 12:26PM   I discussed medical therapy vs repeat HOD and because of her response to the HOD, I will get her set up to have that again. I reviewed the risks including but not limited to bleeding, infection, bladder injury, thrombotic events and anesthetic risks. She was given Uribel for symptomatic relief in the meantime. I also reinforced the advantages of avoiding dietary irritants but she is already doing that.   Discussion/Summary    CC: Dr. Dossie Arbour.

## 2011-07-19 ENCOUNTER — Other Ambulatory Visit: Payer: Self-pay

## 2011-07-19 ENCOUNTER — Ambulatory Visit (HOSPITAL_BASED_OUTPATIENT_CLINIC_OR_DEPARTMENT_OTHER)
Admission: RE | Admit: 2011-07-19 | Discharge: 2011-07-19 | Disposition: A | Discharge status: Home / Self Care | Payer: BC Managed Care – PPO | Source: Ambulatory Visit | Attending: Urology | Admitting: Urology

## 2011-07-19 ENCOUNTER — Encounter (HOSPITAL_BASED_OUTPATIENT_CLINIC_OR_DEPARTMENT_OTHER): Payer: Self-pay | Admitting: Anesthesiology

## 2011-07-19 ENCOUNTER — Encounter (HOSPITAL_BASED_OUTPATIENT_CLINIC_OR_DEPARTMENT_OTHER)
Admission: RE | Disposition: A | Discharge status: Home / Self Care | Payer: Self-pay | Source: Ambulatory Visit | Attending: Urology

## 2011-07-19 ENCOUNTER — Ambulatory Visit (HOSPITAL_BASED_OUTPATIENT_CLINIC_OR_DEPARTMENT_OTHER): Payer: BC Managed Care – PPO | Admitting: Anesthesiology

## 2011-07-19 DIAGNOSIS — E7800 Pure hypercholesterolemia, unspecified: Secondary | ICD-10-CM | POA: Insufficient documentation

## 2011-07-19 DIAGNOSIS — N3941 Urge incontinence: Secondary | ICD-10-CM | POA: Insufficient documentation

## 2011-07-19 DIAGNOSIS — N3010 Interstitial cystitis (chronic) without hematuria: Secondary | ICD-10-CM | POA: Insufficient documentation

## 2011-07-19 DIAGNOSIS — K219 Gastro-esophageal reflux disease without esophagitis: Secondary | ICD-10-CM | POA: Insufficient documentation

## 2011-07-19 DIAGNOSIS — Z79899 Other long term (current) drug therapy: Secondary | ICD-10-CM | POA: Insufficient documentation

## 2011-07-19 HISTORY — PX: CYSTO WITH HYDRODISTENSION: SHX5453

## 2011-07-19 HISTORY — DX: Unspecified osteoarthritis, unspecified site: M19.90

## 2011-07-19 HISTORY — DX: Solitary pulmonary nodule: R91.1

## 2011-07-19 HISTORY — DX: Interstitial cystitis (chronic) without hematuria: N30.10

## 2011-07-19 HISTORY — DX: Hyperlipidemia, unspecified: E78.5

## 2011-07-19 HISTORY — DX: Anesthesia of skin: R20.0

## 2011-07-19 SURGERY — CYSTOSCOPY, WITH BLADDER HYDRODISTENSION
Anesthesia: General | Site: Bladder | Wound class: Clean Contaminated

## 2011-07-19 MED ORDER — FENTANYL CITRATE 0.05 MG/ML IJ SOLN
INTRAMUSCULAR | Status: DC | PRN
  Administered 2011-07-19 (×2): 25 ug via INTRAVENOUS
  Administered 2011-07-19: 50 ug via INTRAVENOUS

## 2011-07-19 MED ORDER — PHENAZOPYRIDINE HCL 200 MG PO TABS: ORAL | Status: DC | PRN

## 2011-07-19 MED ORDER — FENTANYL CITRATE 0.05 MG/ML IJ SOLN
25.0000 ug | INTRAMUSCULAR | Status: DC | PRN
  Administered 2011-07-19 (×2): 25 ug via INTRAVENOUS

## 2011-07-19 MED ORDER — BUPIVACAINE HCL (PF) 0.25 % IJ SOLN
INTRAMUSCULAR | Status: DC | PRN
  Administered 2011-07-19: 30 mL

## 2011-07-19 MED ORDER — CIPROFLOXACIN HCL 500 MG PO TABS: 500.0000 mg | ORAL_TABLET | Freq: Two times a day (BID) | ORAL | Status: AC

## 2011-07-19 MED ORDER — LIDOCAINE HCL (CARDIAC) 20 MG/ML IV SOLN
INTRAVENOUS | Status: DC | PRN
  Administered 2011-07-19: 80 mg via INTRAVENOUS

## 2011-07-19 MED ORDER — PHENAZOPYRIDINE HCL 200 MG PO TABS: 200.0000 mg | ORAL_TABLET | Freq: Three times a day (TID) | ORAL | Status: DC

## 2011-07-19 MED ORDER — ONDANSETRON HCL 4 MG/2ML IJ SOLN
INTRAMUSCULAR | Status: DC | PRN
  Administered 2011-07-19: 4 mg via INTRAVENOUS

## 2011-07-19 MED ORDER — CIPROFLOXACIN IN D5W 400 MG/200ML IV SOLN
400.0000 mg | INTRAVENOUS | Status: AC
  Administered 2011-07-19: 400 mg via INTRAVENOUS

## 2011-07-19 MED ORDER — MIDAZOLAM HCL 5 MG/5ML IJ SOLN
INTRAMUSCULAR | Status: DC | PRN
  Administered 2011-07-19: 2 mg via INTRAVENOUS

## 2011-07-19 MED ORDER — PROPOFOL 10 MG/ML IV EMUL
INTRAVENOUS | Status: DC | PRN
  Administered 2011-07-19: 200 mg via INTRAVENOUS

## 2011-07-19 MED ORDER — PHENAZOPYRIDINE HCL 200 MG PO TABS
200.0000 mg | ORAL_TABLET | ORAL | Status: DC
  Administered 2011-07-19: 200 mg via ORAL

## 2011-07-19 MED ORDER — PROMETHAZINE HCL 25 MG/ML IJ SOLN: 6.2500 mg | INTRAMUSCULAR | Status: DC | PRN

## 2011-07-19 MED ORDER — LACTATED RINGERS IV SOLN
INTRAVENOUS | Status: DC
  Administered 2011-07-19: 09:00:00 via INTRAVENOUS

## 2011-07-19 MED ORDER — PHENAZOPYRIDINE HCL 200 MG PO TABS: 200.0000 mg | ORAL_TABLET | Freq: Three times a day (TID) | ORAL | Status: AC | PRN

## 2011-07-19 MED ORDER — STERILE WATER FOR IRRIGATION IR SOLN
Status: DC | PRN
  Administered 2011-07-19: 3000 mL via INTRAVESICAL

## 2011-07-19 MED ORDER — DEXAMETHASONE SODIUM PHOSPHATE 4 MG/ML IJ SOLN
INTRAMUSCULAR | Status: DC | PRN
  Administered 2011-07-19: 4 mg via INTRAVENOUS

## 2011-07-19 SURGICAL SUPPLY — 18 items
BAG DRAIN URO-CYSTO SKYTR STRL (DRAIN) ×2 IMPLANT
BAG DRN UROCATH (DRAIN) ×1
CANISTER SUCT LVC 12 LTR MEDI- (MISCELLANEOUS) ×1 IMPLANT
CATH FOLEY 2WAY SLVR  5CC 16FR (CATHETERS)
CATH FOLEY 2WAY SLVR 5CC 16FR (CATHETERS) ×1 IMPLANT
CLOTH BEACON ORANGE TIMEOUT ST (SAFETY) ×2 IMPLANT
DRAPE CAMERA CLOSED 9X96 (DRAPES) ×2 IMPLANT
ELECT REM PT RETURN 9FT ADLT (ELECTROSURGICAL)
ELECTRODE REM PT RTRN 9FT ADLT (ELECTROSURGICAL) ×1 IMPLANT
GLOVE INDICATOR 6.5 STRL GRN (GLOVE) ×1 IMPLANT
GLOVE SURG SS PI 8.0 STRL IVOR (GLOVE) ×2 IMPLANT
GOWN BRE IMP SLV AUR LG STRL (GOWN DISPOSABLE) ×1 IMPLANT
GOWN STRL REIN XL XLG (GOWN DISPOSABLE) ×2 IMPLANT
NDL SAFETY ECLIPSE 18X1.5 (NEEDLE) ×1 IMPLANT
NEEDLE HYPO 18GX1.5 SHARP (NEEDLE) ×2
NS IRRIG 500ML POUR BTL (IV SOLUTION) IMPLANT
PACK CYSTOSCOPY (CUSTOM PROCEDURE TRAY) ×2 IMPLANT
SYR 30ML LL (SYRINGE) ×2 IMPLANT

## 2011-07-19 NOTE — Anesthesia Postprocedure Evaluation (Signed)
  Anesthesia Post-op Note  Patient: Brenda Gray  Procedure(s) Performed:  CYSTOSCOPY/HYDRODISTENSION - CYSTOSCOPY WITH HYDORDISTENTION OF BLADDER AND INSTILLATION OF MARCAINE AND PYRIDIUM  Patient Location: PACU  Anesthesia Type: General  Level of Consciousness: awake and alert   Airway and Oxygen Therapy: Patient Spontanous Breathing  Post-op Pain: mild  Post-op Assessment: Post-op Vital signs reviewed, Patient's Cardiovascular Status Stable, Respiratory Function Stable, Patent Airway and No signs of Nausea or vomiting  Post-op Vital Signs: stable  Complications: No apparent anesthesia complications

## 2011-07-19 NOTE — Anesthesia Postprocedure Evaluation (Signed)
  Anesthesia Post-op Note  Patient: Brenda Gray  Procedure(s) Performed:  CYSTOSCOPY/HYDRODISTENSION - CYSTOSCOPY WITH HYDORDISTENTION OF BLADDER AND INSTILLATION OF MARCAINE AND PYRIDIUM  Patient Location: PACU  Anesthesia Type: General  Level of Consciousness: awake and alert   Airway and Oxygen Therapy: Patient Spontanous Breathing  Post-op Pain: mild  Post-op Assessment: Post-op Vital signs reviewed, Patient's Cardiovascular Status Stable, Respiratory Function Stable, Patent Airway and No signs of Nausea or vomiting  Post-op Vital Signs: stable  Complications: No apparent anesthesia complications  

## 2011-07-19 NOTE — Op Note (Signed)
Brenda Gray, Brenda Gray NO.:  [id number]  MEDICAL RECORD NO.:  [id number]  LOCATION:                               FACILITY:  [facility]  PHYSICIAN:  Excell Seltzer. Annabell Howells, M.D.    DATE OF BIRTH:  01-27-1961  DATE OF PROCEDURE:  07/19/2011 DATE OF DISCHARGE:                              OPERATIVE REPORT   PROCEDURE:  Cystoscopy, hydrodistention of bladder, instillation of Pyridium and Marcaine.  PREOPERATIVE DIAGNOSIS:  Interstitial cystitis.  POSTOPERATIVE DIAGNOSIS:  Interstitial cystitis.  SURGEON:  Excell Seltzer. Annabell Howells, M.D.  ANESTHESIA:  General.  DRAINS:  None.  COMPLICATIONS:  None.  INDICATIONS:  Brenda Gray is a 50 year old white female with a history of interstitial cystitis.  She has had recurrent symptoms and has elected to undergo a repeat hydrodistention of bladder.  FINDINGS AND PROCEDURE:  She was taken to the operating room where general anesthetic was induced.  She was placed in lithotomy position. She was given 400 mg of Cipro IV.  Cystoscopy was performed using a 22-French scope and 12-degree lens. Examination revealed a normal urethra.  The bladder wall had mild trabeculation without mucosal lesions.  Ureteral orifices were unremarkable.  No tumors or stones were seen.  Her bladder was then dilated under 80 cm of water pressure capacity. This was held for 2 minutes and the bladder was drained.  Her capacity under anesthesia was 800 mL and the terminal efflux was bloody.  Repeat cystoscopy following hydrodistention demonstrated diffuse glomerulations, particularly in the trigone base of the bladder and posterior wall.  At this point, the bladder was drained.  The cystoscope was removed.  A 14-French red rubber catheter was inserted and the bladder was instilled with 30 mL of 0.25% Marcaine with 400 mg of crushed Pyridium.  She was taken down from lithotomy position.  Moved to recovery room in stable condition.  There were no  complications.     Excell Seltzer. Annabell Howells, M.D.     JJW/MEDQ  D:  07/19/2011  T:  07/19/2011  Job:  [id number]  cc:   Barry Dienes. Eloise Harman, M.D. Fax: [phone]

## 2011-07-19 NOTE — Interval H&P Note (Signed)
History and Physical Interval Note:   07/19/2011   8:46 AM   Brenda Gray  has presented today for surgery, with the diagnosis of IC  The various methods of treatment have been discussed with the patient and family. After consideration of risks, benefits and other options for treatment, the patient has consented to  Procedure(s): CYSTOSCOPY/HYDRODISTENSION as a surgical intervention .  The patients' history has been reviewed, patient examined, no change in status, stable for surgery.  I have reviewed the patients' chart and labs.  Questions were answered to the patient's satisfaction.     Anner Crete  MD

## 2011-07-19 NOTE — Anesthesia Preprocedure Evaluation (Addendum)
Anesthesia Evaluation  Patient identified by MRN, date of birth, ID band Patient awake    Reviewed: Allergy & Precautions, H&P , NPO status , Patient's Chart, lab work & pertinent test results  Airway Mallampati: II TM Distance: >3 FB Neck ROM: Limited    Dental No notable dental hx. (+) Teeth Intact   Pulmonary neg pulmonary ROS,  clear to auscultation  Pulmonary exam normal       Cardiovascular neg cardio ROS Regular Normal    Neuro/Psych Negative Neurological ROS  Negative Psych ROS   GI/Hepatic negative GI ROS, Neg liver ROS,   Endo/Other  Negative Endocrine ROS  Renal/GU negative Renal ROS  Genitourinary negative   Musculoskeletal negative musculoskeletal ROS (+)   Abdominal   Peds negative pediatric ROS (+)  Hematology negative hematology ROS (+)   Anesthesia Other Findings   Reproductive/Obstetrics negative OB ROS                          Anesthesia Physical Anesthesia Plan  ASA: II  Anesthesia Plan: General   Post-op Pain Management:    Induction: Intravenous  Airway Management Planned: LMA  Additional Equipment:   Intra-op Plan:   Post-operative Plan: Extubation in OR  Informed Consent: I have reviewed the patients History and Physical, chart, labs and discussed the procedure including the risks, benefits and alternatives for the proposed anesthesia with the patient or authorized representative who has indicated his/her understanding and acceptance.   Dental advisory given  Plan Discussed with: CRNA  Anesthesia Plan Comments:         Anesthesia Quick Evaluation

## 2011-07-19 NOTE — Anesthesia Procedure Notes (Addendum)
Procedure Name: LMA Insertion Date/Time: 07/19/2011 9:23 AM Performed by: Renella Cunas D Pre-anesthesia Checklist: Patient identified, Emergency Drugs available, Suction available and Patient being monitored Patient Re-evaluated:Patient Re-evaluated prior to inductionOxygen Delivery Method: Circle System Utilized Preoxygenation: Pre-oxygenation with 100% oxygen Intubation Type: IV induction Ventilation: Mask ventilation without difficulty LMA: LMA inserted LMA Size: 4.0 Number of attempts: 1 Airway Equipment and Method: bite block Placement Confirmation: positive ETCO2 and breath sounds checked- equal and bilateral Dental Injury: Teeth and Oropharynx as per pre-operative assessment

## 2011-07-19 NOTE — Transfer of Care (Signed)
Immediate Anesthesia Transfer of Care Note  Patient: Brenda Gray  Procedure(s) Performed:  CYSTOSCOPY/HYDRODISTENSION - CYSTOSCOPY WITH HYDORDISTENTION OF BLADDER AND INSTILLATION OF MARCAINE AND PYRIDIUM  Patient Location: PACU  Anesthesia Type: General  Level of Consciousness: awake, oriented, sedated and patient cooperative  Airway & Oxygen Therapy: Patient Spontanous Breathing and Patient connected to face mask oxygen  Post-op Assessment: Report given to PACU RN and Post -op Vital signs reviewed and stable  Post vital signs: Reviewed and stable  Complications: No apparent anesthesia complications

## 2011-07-19 NOTE — Brief Op Note (Signed)
07/19/2011  9:42 AM  PATIENT:  Brenda Gray  50 y.o. female  PRE-OPERATIVE DIAGNOSIS:  IC  POST-OPERATIVE DIAGNOSIS:  Interstitial cystitis  PROCEDURE:  Procedure(s): CYSTOSCOPY/HYDRODISTENSION  SURGEON:  Surgeon(s): Anner Crete  PHYSICIAN ASSISTANT:   ASSISTANTS: none   ANESTHESIA:   general  EBL:  Total I/O In: 100 [I.V.:100] Out: -   BLOOD ADMINISTERED:none  DRAINS: none   LOCAL MEDICATIONS USED:  MARCAINE 30CC 0.25% with 400mg  pyridium  SPECIMEN:  No Specimen  DISPOSITION OF SPECIMEN:  N/A  COUNTS:  YES  TOURNIQUET:  * No tourniquets in log *  DICTATION: .Other Dictation: Dictation Number [id number]  PLAN OF CARE: Discharge to home after PACU  PATIENT DISPOSITION:  PACU - hemodynamically stable.   Delay start of Pharmacological VTE agent (>24hrs) due to surgical blood loss or risk of bleeding:  {YES/NO/NOT APPLICABLE:20182

## 2011-07-24 ENCOUNTER — Encounter (HOSPITAL_BASED_OUTPATIENT_CLINIC_OR_DEPARTMENT_OTHER): Payer: Self-pay | Admitting: Urology

## 2012-02-15 DIAGNOSIS — R350 Frequency of micturition: Secondary | ICD-10-CM | POA: Diagnosis not present

## 2012-02-15 DIAGNOSIS — G4700 Insomnia, unspecified: Secondary | ICD-10-CM | POA: Diagnosis not present

## 2012-02-15 DIAGNOSIS — E785 Hyperlipidemia, unspecified: Secondary | ICD-10-CM | POA: Diagnosis not present

## 2012-02-15 DIAGNOSIS — Z0000 Encounter for general adult medical examination without abnormal findings: Secondary | ICD-10-CM | POA: Diagnosis not present

## 2012-03-10 DIAGNOSIS — N3941 Urge incontinence: Secondary | ICD-10-CM | POA: Diagnosis not present

## 2012-03-10 DIAGNOSIS — N3946 Mixed incontinence: Secondary | ICD-10-CM | POA: Diagnosis not present

## 2012-03-26 DIAGNOSIS — N3010 Interstitial cystitis (chronic) without hematuria: Secondary | ICD-10-CM | POA: Diagnosis not present

## 2012-03-26 DIAGNOSIS — N3946 Mixed incontinence: Secondary | ICD-10-CM | POA: Diagnosis not present

## 2012-03-26 DIAGNOSIS — N318 Other neuromuscular dysfunction of bladder: Secondary | ICD-10-CM | POA: Diagnosis not present

## 2012-04-22 DIAGNOSIS — G894 Chronic pain syndrome: Secondary | ICD-10-CM | POA: Diagnosis not present

## 2012-04-22 DIAGNOSIS — M47817 Spondylosis without myelopathy or radiculopathy, lumbosacral region: Secondary | ICD-10-CM | POA: Diagnosis not present

## 2012-04-22 DIAGNOSIS — IMO0002 Reserved for concepts with insufficient information to code with codable children: Secondary | ICD-10-CM | POA: Diagnosis not present

## 2012-04-28 DIAGNOSIS — N3010 Interstitial cystitis (chronic) without hematuria: Secondary | ICD-10-CM | POA: Diagnosis not present

## 2012-04-28 DIAGNOSIS — N318 Other neuromuscular dysfunction of bladder: Secondary | ICD-10-CM | POA: Diagnosis not present

## 2012-04-28 DIAGNOSIS — N3946 Mixed incontinence: Secondary | ICD-10-CM | POA: Diagnosis not present

## 2012-06-02 DIAGNOSIS — N3946 Mixed incontinence: Secondary | ICD-10-CM | POA: Diagnosis not present

## 2012-06-05 ENCOUNTER — Other Ambulatory Visit: Payer: Self-pay | Admitting: Urology

## 2012-06-12 ENCOUNTER — Encounter (HOSPITAL_BASED_OUTPATIENT_CLINIC_OR_DEPARTMENT_OTHER): Payer: Self-pay | Admitting: *Deleted

## 2012-06-12 NOTE — Progress Notes (Signed)
NPO AFTER MN. ARRIVES AT 0845. NEEDS HG. 

## 2012-06-16 NOTE — H&P (Signed)
History of Present Illness   I was consulted by Dr. Annabell Howells regarding Brenda Gray's urinary frequency and incontinence. She leaked with coughing, sneezing, bending and lifting. During one hour of exercise she may urinate small amounts 4 or 5 times. She actually denied urge incontinence and enuresis. She wears 2-3 pads a day which are damp to moderately wet.  She said she voids 4-5 times in an hour. She said she does not think she can sit through a 2-hour movie. She says she thinks she "leaks all the time". She gets up 0-1 time at night. She reports a good flow and she does feel empty but moments later she feels like she needs to go again. Importantly, she voids frequently because of urgency and not due to pressure or pain.   She passed a stone 20 years ago. She has had a hydrodistension that was positive for interstitial cystitis. She does not get urinary tract infections.  She has had 3 neck operations, the last being 3 years ago. She has some left arm numbness. She gets injections for her lower back. She has had a hysterectomy. Her bowel function is normal. She denies vaginal dryness and dyspareunia.   By history she failed Myrbetriq, the Gel, VESIcare and I believe Toviaz and other medications.   She does not report pelvic pain.   One rescue treatment did not help her symptoms. She has not seen our physical therapy team.    Past Medical History Problems  1. History of  Bladder Irrigation 2. History of  Esophageal Reflux 530.81 3. History of  Feelings Of Urinary Urgency 788.63 4. History of  Gastric Ulcer 531.90 5. History of  Hypercholesterolemia 272.0 6. History of  Nephrolithiasis V13.01 7. History of  Stress Incontinence 788.39 8. History of  Urinary Frequency 788.41  Surgical History Problems  1. History of  Bladder Irrigation 2. History of  Cervical Vertebral Fusion 3. History of  Cystoscopy With Dilation Of Bladder 4. History of  Cystoscopy With Dilation Of Bladder 5.  History of  Hysterectomy V45.77 6. History of  Neck Surgery 7. History of  Rotator Cuff Repair 8. History of  Rotator Cuff Repair  Current Meds 1. Calcium TABS; Therapy: (Recorded:23Oct2012) to 2. Hydrocodone-Acetaminophen 5-325 MG Oral Tablet; Therapy: 27Jun2012 to 3. Multi-Vitamin Oral Tablet; Therapy: (Recorded:23Oct2012) to 4. Myrbetriq 25 MG Oral Tablet Extended Release 24 Hour; Take 1 tablet daily; Therapy: 17Jul2013  to (Evaluate:14Aug2013); Last Rx:17Jul2013 5. Restasis 0.05 % Ophthalmic Emulsion; Therapy: 13May2013 to 6. Triazolam 0.25 MG Oral Tablet; Therapy: (Recorded:23Oct2012) to  Allergies Medication  1. No Known Drug Allergies  Family History Problems  1. Family history of  Family Health Status Number Of Children 1 son 2. Maternal history of  Hypertension V17.49 3. Fraternal history of  Nephrolithiasis 4. 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  Genitourinary: incontinence.    Vitals Vital Signs [Data Includes: Last 1 Day]  23Sep2013 08:27AM  Blood Pressure: 146 / 79 Temperature: 98.3 F Heart Rate: 68  Physical Exam Constitutional: Well nourished and well developed . No acute distress.  ENT:. The ears and nose are normal in appearance.  Neck: The appearance of the neck is normal and no neck mass is present.  Pulmonary: No respiratory distress and normal respiratory rhythm and effort.  Cardiovascular: Heart rate and rhythm are normal . No peripheral edema.  Abdomen: The  abdomen is soft and nontender. No masses are palpated. No CVA tenderness. No hernias are palpable. No hepatosplenomegaly noted.  Lymphatics: The femoral and inguinal nodes are not enlarged or tender.  Skin: Normal skin turgor, no visible rash and no visible skin lesions.  Neuro/Psych:. Mood and affect are appropriate.   . Genitourinary:  On pelvic examination Ms. Laham's bladder was somewhat tender.  She may have had a little bit of tenderness in her levator muscles and there may have been a little bit of tenseness within them.     Results/Data    Today she underwent a number of tests which I personally reviewed. Urinalysis: Negative.  Bladder scan: Bladder scan residual was 7 mL.  She had urodynamics and had a maximum capacity of 47 mL reaching pressures of 48 cm of water associated with severe leakage. Her Valsalva leak-point pressure was 102 cm of water at 25 mL with mild leakage. During voluntary voiding she voided 32 mL with a maximum flow of 6 mL per second. Her maximum voiding pressure was 39 cm of water and she emptied efficiently. EMG increased during the voiding phase. Bladder was hypersensitive limited by fullness, pain and leakage. Her instability certainly was very impressive. When she was hydrodistended in November 2012, her capacity was 800 mL and she had diffuse glomerulations.   Bladder scan: Bladder scan residual was 7 mL. Urine [Data Includes: Last 1 Day]   23Sep2013  COLOR YELLOW   APPEARANCE CLEAR   SPECIFIC GRAVITY 1.025   pH 5.5   GLUCOSE NEG mg/dL  BILIRUBIN NEG   KETONE NEG mg/dL  BLOOD NEG   PROTEIN NEG mg/dL  UROBILINOGEN 0.2 mg/dL  NITRITE NEG   LEUKOCYTE ESTERASE NEG    Assessment Assessed  1. Urge And Stress Incontinence 788.33  Plan  Urge And Stress Incontinence (788.33)  1. Complex Uroflowmetry  Done: 23Sep2013 2. PVR U/S  Done: 23Sep2013  Discussion/Summary   Ms. Callow had significant urinary frequency affecting her quality of  life. She also is troubled by her stress incontinence and she may have a little bit of urge incontinence. Certainly I do not recommend a sling at this point. I agree that her urgency and frequency is somewhat out of the ordinary and her urodynamics are possibly in keeping with a neurogenic bladder. She had a positive hydrodistention and is supporting a diagnosis of painless interstitial cystitis which is perhaps the underlying cause. She has minimal nocturia and has very good capacity under anesthesia. I agree with Dr. Annabell Howells that treatment should first be directed towards her overactive bladder.   Ms. Flemings has refractory urinary frequency. I discussed her PTNS.  Pros, cons, success and failure rates of PTNS were discussed. Treatment protocol was reviewed. Risks were described but not limited to the risk of persistent, de novo, or worsening incontinence. Risks of pain, bruising, bleeding, infection, and neuropathy were discussed.   I discussed Botox.   Pros, cons, success and failure rates of BOTOX were discussed. We talked about off-label usage, durability, and retreatment rates. Risks were described but not limited to the risk of persistent, de novo, or worsening incontinence. We talked about the risk of retention requiring catheterization. We talked about the  risk of flow symptoms and high residual urine volumes. Risks of pain, bleeding, infection, and neuropathy were discussed. Rare risks of nerve paralysis and death were discussed. The patient understands that she might not reach her treatment goal and that she might be worse following surgery.  I discussed Interstim.  Pros, cons, success and failure rates of Interstim were discussed. We talked about the test stimulation (office/operating room) and the second stage procedure. Risks were described but not limited to the risk of persistent, de novo, or worsening incontinence. Risks of pain, bleeding, infection, and neuropathy were discussed.  Risk of malfunction, migration, and breakage were discussed. Trouble-shooting, battery life, and the need for explanation and reoperation were discussed. MRI issues were discussed. The patient understands that she might not reach her treatment goal and that she might be worse following surgery.   ____ minimize my discussion regarding PTNS because Ms. Martus is covered by [employer]. I talked about doing more instillations but she found it personally very irritating.  She may need future MRI's and even a neurosurgical implant for her back which makes her hesitate on Interstim. She would like to proceed with Botox and was clear on potential for slow flow, high residuals and retention requiring clean-intermittent-catheterization.  I am going to send a copy of my note to Dr. Bjorn Pippin to keep him updated on her treatment course.  After a thorough review of the management options for the patient's condition the patient  elected to proceed with surgical therapy as noted above. We have discussed the potential benefits and risks of the procedure, side effects of the proposed treatment, the likelihood of the patient achieving the goals of the procedure, and any potential problems that might occur during the procedure or recuperation. Informed consent has been obtained.

## 2012-06-17 ENCOUNTER — Ambulatory Visit (HOSPITAL_BASED_OUTPATIENT_CLINIC_OR_DEPARTMENT_OTHER)
Admission: RE | Admit: 2012-06-17 | Discharge: 2012-06-17 | Disposition: A | Discharge status: Home / Self Care | Payer: BC Managed Care – PPO | Source: Ambulatory Visit | Attending: Urology | Admitting: Urology

## 2012-06-17 ENCOUNTER — Ambulatory Visit (HOSPITAL_BASED_OUTPATIENT_CLINIC_OR_DEPARTMENT_OTHER): Payer: BC Managed Care – PPO | Admitting: Anesthesiology

## 2012-06-17 ENCOUNTER — Encounter (HOSPITAL_BASED_OUTPATIENT_CLINIC_OR_DEPARTMENT_OTHER): Payer: Self-pay | Admitting: Anesthesiology

## 2012-06-17 ENCOUNTER — Encounter (HOSPITAL_BASED_OUTPATIENT_CLINIC_OR_DEPARTMENT_OTHER)
Admission: RE | Disposition: A | Discharge status: Home / Self Care | Payer: Self-pay | Source: Ambulatory Visit | Attending: Urology

## 2012-06-17 ENCOUNTER — Encounter (HOSPITAL_BASED_OUTPATIENT_CLINIC_OR_DEPARTMENT_OTHER): Payer: Self-pay

## 2012-06-17 DIAGNOSIS — E7800 Pure hypercholesterolemia, unspecified: Secondary | ICD-10-CM | POA: Diagnosis not present

## 2012-06-17 DIAGNOSIS — N39498 Other specified urinary incontinence: Secondary | ICD-10-CM | POA: Diagnosis not present

## 2012-06-17 DIAGNOSIS — R3915 Urgency of urination: Secondary | ICD-10-CM | POA: Insufficient documentation

## 2012-06-17 DIAGNOSIS — N3941 Urge incontinence: Secondary | ICD-10-CM | POA: Diagnosis not present

## 2012-06-17 DIAGNOSIS — Z79899 Other long term (current) drug therapy: Secondary | ICD-10-CM | POA: Diagnosis not present

## 2012-06-17 DIAGNOSIS — K219 Gastro-esophageal reflux disease without esophagitis: Secondary | ICD-10-CM | POA: Diagnosis not present

## 2012-06-17 DIAGNOSIS — R350 Frequency of micturition: Secondary | ICD-10-CM | POA: Diagnosis not present

## 2012-06-17 HISTORY — DX: Headache, unspecified: R51.9

## 2012-06-17 HISTORY — DX: Personal history of other diseases of the digestive system: Z87.19

## 2012-06-17 HISTORY — DX: Other chronic pain: G89.29

## 2012-06-17 HISTORY — DX: Urge incontinence: N39.41

## 2012-06-17 HISTORY — DX: Other specified postprocedural states: Z98.890

## 2012-06-17 HISTORY — DX: Nocturia: R35.1

## 2012-06-17 HISTORY — DX: Gastro-esophageal reflux disease without esophagitis: K21.9

## 2012-06-17 HISTORY — DX: Headache: R51

## 2012-06-17 HISTORY — PX: CYSTOSCOPY WITH INJECTION: SHX1424

## 2012-06-17 HISTORY — DX: Other cervical disc degeneration, unspecified cervical region: M50.30

## 2012-06-17 HISTORY — DX: Dry eye syndrome of bilateral lacrimal glands: H04.123

## 2012-06-17 HISTORY — DX: Frequency of micturition: R35.0

## 2012-06-17 SURGERY — CYSTOSCOPY, WITH INJECTION OF BLADDER NECK OR BLADDER WALL
Anesthesia: General | Site: Bladder | Wound class: Clean Contaminated

## 2012-06-17 MED ORDER — CIPROFLOXACIN IN D5W 400 MG/200ML IV SOLN: 400.0000 mg | Freq: Two times a day (BID) | INTRAVENOUS | Status: DC

## 2012-06-17 MED ORDER — FENTANYL CITRATE 0.05 MG/ML IJ SOLN
INTRAMUSCULAR | Status: DC | PRN
  Administered 2012-06-17 (×2): 50 ug via INTRAVENOUS

## 2012-06-17 MED ORDER — PROPOFOL 10 MG/ML IV BOLUS
INTRAVENOUS | Status: DC | PRN
  Administered 2012-06-17: 160 mg via INTRAVENOUS

## 2012-06-17 MED ORDER — LACTATED RINGERS IV SOLN: INTRAVENOUS | Status: DC

## 2012-06-17 MED ORDER — ONDANSETRON HCL 4 MG/2ML IJ SOLN
INTRAMUSCULAR | Status: DC | PRN
  Administered 2012-06-17: 4 mg via INTRAVENOUS

## 2012-06-17 MED ORDER — DEXAMETHASONE SODIUM PHOSPHATE 4 MG/ML IJ SOLN
INTRAMUSCULAR | Status: DC | PRN
  Administered 2012-06-17: 10 mg via INTRAVENOUS

## 2012-06-17 MED ORDER — LIDOCAINE HCL (CARDIAC) 20 MG/ML IV SOLN
INTRAVENOUS | Status: DC | PRN
  Administered 2012-06-17: 50 mg via INTRAVENOUS

## 2012-06-17 MED ORDER — SODIUM CHLORIDE 0.9 % IJ SOLN
INTRAMUSCULAR | Status: DC | PRN
  Administered 2012-06-17: 30 mL via INTRAVENOUS

## 2012-06-17 MED ORDER — MIDAZOLAM HCL 5 MG/5ML IJ SOLN
INTRAMUSCULAR | Status: DC | PRN
  Administered 2012-06-17: 2 mg via INTRAVENOUS

## 2012-06-17 MED ORDER — PROMETHAZINE HCL 25 MG/ML IJ SOLN: 6.2500 mg | INTRAMUSCULAR | Status: DC | PRN

## 2012-06-17 MED ORDER — STERILE WATER FOR IRRIGATION IR SOLN
Status: DC | PRN
  Administered 2012-06-17: 3000 mL via INTRAVESICAL

## 2012-06-17 MED ORDER — ONABOTULINUMTOXINA 100 UNITS IJ SOLR
INTRAMUSCULAR | Status: DC | PRN
  Administered 2012-06-17: 100 [IU] via INTRAMUSCULAR

## 2012-06-17 MED ORDER — FENTANYL CITRATE 0.05 MG/ML IJ SOLN: 25.0000 ug | INTRAMUSCULAR | Status: DC | PRN

## 2012-06-17 MED ORDER — HYDROCODONE-ACETAMINOPHEN 5-325 MG PO TABS
1.0000 | ORAL_TABLET | Freq: Four times a day (QID) | ORAL | Status: AC | PRN
  Administered 2012-06-17: 1 via ORAL

## 2012-06-17 MED ORDER — HYDROCODONE-ACETAMINOPHEN 5-500 MG PO TABS: 1.0000 | ORAL_TABLET | Freq: Four times a day (QID) | ORAL | Status: DC | PRN

## 2012-06-17 MED ORDER — KETOROLAC TROMETHAMINE 30 MG/ML IJ SOLN
INTRAMUSCULAR | Status: DC | PRN
  Administered 2012-06-17: 30 mg via INTRAVENOUS

## 2012-06-17 MED ORDER — LACTATED RINGERS IV SOLN
INTRAVENOUS | Status: DC
  Administered 2012-06-17: 10:00:00 via INTRAVENOUS
  Administered 2012-06-17: 100 mL/h via INTRAVENOUS

## 2012-06-17 MED ORDER — CIPROFLOXACIN HCL 250 MG PO TABS: 250.0000 mg | ORAL_TABLET | Freq: Two times a day (BID) | ORAL | Status: DC

## 2012-06-17 MED ORDER — CIPROFLOXACIN IN D5W 400 MG/200ML IV SOLN
400.0000 mg | INTRAVENOUS | Status: AC
  Administered 2012-06-17: 400 mg via INTRAVENOUS

## 2012-06-17 MED ORDER — MEPERIDINE HCL 25 MG/ML IJ SOLN: 6.2500 mg | INTRAMUSCULAR | Status: DC | PRN

## 2012-06-17 SURGICAL SUPPLY — 19 items
BAG DRAIN URO-CYSTO SKYTR STRL (DRAIN) ×2 IMPLANT
BAG DRN UROCATH (DRAIN) ×1
CANISTER SUCT LVC 12 LTR MEDI- (MISCELLANEOUS) ×1 IMPLANT
CATH ROBINSON RED A/P 12FR (CATHETERS) IMPLANT
CLOTH BEACON ORANGE TIMEOUT ST (SAFETY) ×2 IMPLANT
DRAPE CAMERA CLOSED 9X96 (DRAPES) ×2 IMPLANT
ELECT REM PT RETURN 9FT ADLT (ELECTROSURGICAL)
ELECTRODE REM PT RTRN 9FT ADLT (ELECTROSURGICAL) IMPLANT
GLOVE BIO SURGEON STRL SZ7.5 (GLOVE) ×2 IMPLANT
GLOVE INDICATOR 6.5 STRL GRN (GLOVE) ×1 IMPLANT
GOWN PREVENTION PLUS LG XLONG (DISPOSABLE) ×2 IMPLANT
GOWN STRL REIN XL XLG (GOWN DISPOSABLE) ×2 IMPLANT
NDL SAFETY ECLIPSE 18X1.5 (NEEDLE) ×1 IMPLANT
NEEDLE HYPO 18GX1.5 SHARP (NEEDLE) ×2
PACK CYSTOSCOPY (CUSTOM PROCEDURE TRAY) ×2 IMPLANT
SYR 20CC LL (SYRINGE) IMPLANT
SYR 30ML LL (SYRINGE) ×4 IMPLANT
SYR BULB IRRIGATION 50ML (SYRINGE) IMPLANT
WATER STERILE IRR 3000ML UROMA (IV SOLUTION) ×2 IMPLANT

## 2012-06-17 NOTE — Progress Notes (Signed)
Dr Sherron Monday into see pt after voiding as ordered. No new orders noted.

## 2012-06-17 NOTE — Op Note (Signed)
Preoperative diagnosis: Refractory urinary frequency and urgency Postoperative diagnosis: Refractory urinary frequency and urgency Surgery: Cystoscopy injection of botulinum toxin Surgeon: Dr. Lorin Picket Khristina Janota  The patient has the above diagnoses and consented the above procedure. She was given preoperative antibiotics. Leg position was good  ACMI scope was utilized. Bladder mucosa and trigone were normal. There was little to no trabeculation and there is no foreign body or evidence of cystitis  I injected 100 units of Botox instilled in 10 cc and normal saline with 20 injections 0.5 cc per injection. I used my usual template at 5 and 1:61 and cephalad to the inter-ureteric ridge. I spared the trigone. I was very pleased the injections. Bladder was emptied at the end and there was no bleeding

## 2012-06-17 NOTE — Anesthesia Preprocedure Evaluation (Signed)
Anesthesia Evaluation  Patient identified by MRN, date of birth, ID band Patient awake    Reviewed: Allergy & Precautions, H&P , NPO status , Patient's Chart, lab work & pertinent test results  Airway Mallampati: II TM Distance: >3 FB Neck ROM: Limited    Dental No notable dental hx. (+) Teeth Intact   Pulmonary neg pulmonary ROS,  breath sounds clear to auscultation  Pulmonary exam normal       Cardiovascular negative cardio ROS  Rhythm:Regular Rate:Normal     Neuro/Psych negative neurological ROS  negative psych ROS   GI/Hepatic negative GI ROS, Neg liver ROS,   Endo/Other  negative endocrine ROS  Renal/GU negative Renal ROS  negative genitourinary   Musculoskeletal negative musculoskeletal ROS (+)   Abdominal   Peds negative pediatric ROS (+)  Hematology negative hematology ROS (+)   Anesthesia Other Findings   Reproductive/Obstetrics negative OB ROS                           Anesthesia Physical  Anesthesia Plan  ASA: II  Anesthesia Plan: General   Post-op Pain Management:    Induction: Intravenous  Airway Management Planned: LMA  Additional Equipment:   Intra-op Plan:   Post-operative Plan: Extubation in OR  Informed Consent: I have reviewed the patients History and Physical, chart, labs and discussed the procedure including the risks, benefits and alternatives for the proposed anesthesia with the patient or authorized representative who has indicated his/her understanding and acceptance.   Dental advisory given  Plan Discussed with: CRNA  Anesthesia Plan Comments:         Anesthesia Quick Evaluation

## 2012-06-17 NOTE — Anesthesia Postprocedure Evaluation (Signed)
  Anesthesia Post-op Note  Patient: Brenda Gray  Procedure(s) Performed: Procedure(s) (LRB): CYSTOSCOPY WITH INJECTION (N/A)  Patient Location: PACU  Anesthesia Type: General  Level of Consciousness: awake and alert   Airway and Oxygen Therapy: Patient Spontanous Breathing  Post-op Pain: mild  Post-op Assessment: Post-op Vital signs reviewed, Patient's Cardiovascular Status Stable, Respiratory Function Stable, Patent Airway and No signs of Nausea or vomiting  Post-op Vital Signs: stable  Complications: No apparent anesthesia complications

## 2012-06-17 NOTE — Anesthesia Procedure Notes (Signed)
Procedure Name: LMA Insertion Date/Time: 06/17/2012 10:30 AM Performed by: Maris Berger T Pre-anesthesia Checklist: Patient identified, Emergency Drugs available, Suction available and Patient being monitored Patient Re-evaluated:Patient Re-evaluated prior to inductionOxygen Delivery Method: Circle System Utilized Preoxygenation: Pre-oxygenation with 100% oxygen Intubation Type: IV induction Ventilation: Mask ventilation without difficulty LMA: LMA inserted LMA Size: 4.0 Number of attempts: 1 Placement Confirmation: positive ETCO2 Dental Injury: Teeth and Oropharynx as per pre-operative assessment  Comments: Gauze roll between teeth

## 2012-06-17 NOTE — H&P (View-Only) (Signed)
NPO AFTER MN. ARRIVES AT 0845. NEEDS HG. 

## 2012-06-17 NOTE — Transfer of Care (Signed)
Immediate Anesthesia Transfer of Care Note  Patient: Brenda Gray  Procedure(s) Performed: Procedure(s) (LRB) with comments: CYSTOSCOPY WITH INJECTION (N/A) - BOTOX  Patient Location: PACU  Anesthesia Type: General  Level of Consciousness: awake, alert  and oriented  Airway & Oxygen Therapy: Patient Spontanous Breathing and Patient connected to nasal cannula oxygen  Post-op Assessment: Post -op Vital signs reviewed and stable  Post vital signs: Reviewed and stable  Complications: No apparent anesthesia complications

## 2012-06-17 NOTE — Interval H&P Note (Signed)
History and Physical Interval Note:  06/17/2012 10:22 AM  Brenda Gray  has presented today for surgery, with the diagnosis of URGE INCONTINENCE  The various methods of treatment have been discussed with the patient and family. After consideration of risks, benefits and other options for treatment, the patient has consented to  Procedure(s) (LRB) with comments: CYSTOSCOPY WITH INJECTION (N/A) - BOTOX as a surgical intervention .  The patient's history has been reviewed, patient examined, no change in status, stable for surgery.  I have reviewed the patient's chart and labs.  Questions were answered to the patient's satisfaction.     Keldric Poyer A

## 2012-06-18 ENCOUNTER — Encounter (HOSPITAL_BASED_OUTPATIENT_CLINIC_OR_DEPARTMENT_OTHER): Payer: Self-pay | Admitting: Urology

## 2012-06-25 DIAGNOSIS — N3946 Mixed incontinence: Secondary | ICD-10-CM | POA: Diagnosis not present

## 2012-06-25 DIAGNOSIS — N3010 Interstitial cystitis (chronic) without hematuria: Secondary | ICD-10-CM | POA: Diagnosis not present

## 2012-06-25 DIAGNOSIS — R350 Frequency of micturition: Secondary | ICD-10-CM | POA: Diagnosis not present

## 2012-07-03 DIAGNOSIS — M545 Low back pain: Secondary | ICD-10-CM | POA: Diagnosis not present

## 2012-07-04 DIAGNOSIS — M5137 Other intervertebral disc degeneration, lumbosacral region: Secondary | ICD-10-CM | POA: Diagnosis not present

## 2012-07-10 DIAGNOSIS — M545 Low back pain: Secondary | ICD-10-CM | POA: Diagnosis not present

## 2012-07-15 DIAGNOSIS — M47817 Spondylosis without myelopathy or radiculopathy, lumbosacral region: Secondary | ICD-10-CM | POA: Diagnosis not present

## 2012-07-15 DIAGNOSIS — IMO0002 Reserved for concepts with insufficient information to code with codable children: Secondary | ICD-10-CM | POA: Diagnosis not present

## 2012-07-15 DIAGNOSIS — G894 Chronic pain syndrome: Secondary | ICD-10-CM | POA: Diagnosis not present

## 2012-08-19 DIAGNOSIS — Z411 Encounter for cosmetic surgery: Secondary | ICD-10-CM | POA: Diagnosis not present

## 2012-08-19 DIAGNOSIS — L719 Rosacea, unspecified: Secondary | ICD-10-CM | POA: Diagnosis not present

## 2012-08-19 DIAGNOSIS — D485 Neoplasm of uncertain behavior of skin: Secondary | ICD-10-CM | POA: Diagnosis not present

## 2012-09-17 ENCOUNTER — Other Ambulatory Visit: Payer: Self-pay | Admitting: Dermatology

## 2012-09-17 DIAGNOSIS — D485 Neoplasm of uncertain behavior of skin: Secondary | ICD-10-CM | POA: Diagnosis not present

## 2012-11-03 DIAGNOSIS — R350 Frequency of micturition: Secondary | ICD-10-CM | POA: Diagnosis not present

## 2012-11-03 DIAGNOSIS — N3946 Mixed incontinence: Secondary | ICD-10-CM | POA: Diagnosis not present

## 2012-11-20 DIAGNOSIS — N3010 Interstitial cystitis (chronic) without hematuria: Secondary | ICD-10-CM | POA: Diagnosis not present

## 2012-11-20 DIAGNOSIS — N3946 Mixed incontinence: Secondary | ICD-10-CM | POA: Diagnosis not present

## 2012-11-20 DIAGNOSIS — R350 Frequency of micturition: Secondary | ICD-10-CM | POA: Diagnosis not present

## 2012-12-03 ENCOUNTER — Encounter (HOSPITAL_BASED_OUTPATIENT_CLINIC_OR_DEPARTMENT_OTHER): Payer: Self-pay | Admitting: *Deleted

## 2012-12-03 NOTE — Progress Notes (Signed)
To [facility] at 0615- Hg on arrival,Npo after MN-

## 2012-12-05 NOTE — H&P (Signed)
History of Present Illness   [name] has complicated voiding dysfunction. She was diagnosed with interstitial cystitis with a positive hydrodistension and a capacity of 800 mL a long while ago. She failed medications. When I first saw her, she had voided 4-5 times in an hour and she also had stress incontinence. She has potential MRI issues and [employer] and chose Botox. She was using 1 or 2 liners a day status post Botox and was delighted. It was performed in October but within approximately a month her symptoms were back.   She limiting her exercise because when she is standing or walking she leaks. She is really leaking a lot without awareness. She also leaks when she coughs or sneezes but she does not if she is sitting. She is not feeling a lot of urgency.   There is no other modifying factors or associated signs or symptoms. There is no other aggravating or relieving factors. The symptoms are moderate to severe in severity and persisting. This has been going on for many months.   Urinalysis: I reviewed, negative.   Review of Systems: No change in bowel or neurologic systems.   [name] had mild grade 2 hypermobility but no stress incontinence. Her left levator was a bit tender. She had marked tenderness of her bladder on light touch anteriorly.   There is no question [name]'s problem is getting worse. It is really affecting her quality of life. It is moderate to severe in severity. There is no other modifying factors or associated signs or symptoms. There is no other aggravating or relieving factors.    Past Medical History Problems  1. History of  Bladder Irrigation 2. History of  Esophageal Reflux 530.81 3. History of  Feelings Of Urinary Urgency 788.63 4. History of  Gastric Ulcer 531.90 5. History of  Hypercholesterolemia 272.0 6. History of  Nephrolithiasis V13.01 7. History of  Stress Incontinence 788.39 8. History of  Urinary Frequency 788.41  Surgical  History Problems  1. History of  Bladder Irrigation 2. History of  Cervical Vertebral Fusion 3. History of  Cystoscopy With Dilation Of Bladder 4. History of  Cystoscopy With Dilation Of Bladder 5. History of  Hysterectomy V45.77 6. History of  Neck Surgery 7. History of  Rotator Cuff Repair 8. History of  Rotator Cuff Repair 9. History of  Urologic Surgery  Current Meds 1. Calcium TABS; Therapy: (Recorded:23Oct2012) to 2. Diazepam 5 MG Oral Tablet; Therapy: 27Jan2014 to 3. Hydrocodone-Acetaminophen 5-325 MG Oral Tablet; Therapy: 27Jun2012 to 4. Multi-Vitamin Oral Tablet; Therapy: (Recorded:23Oct2012) to 5. Restasis 0.05 % Ophthalmic Emulsion; Therapy: 13May2013 to 6. Triazolam 0.25 MG Oral Tablet; Therapy: (Recorded:23Oct2012) to 7. Uribel 118 MG Oral Capsule; Therapy: (Recorded:13Mar2014) to  Allergies Medication  1. No Known Drug Allergies  Family History Problems  1. Family history of  Family Health Status Number Of Children 1 son 2. Maternal history of  Hypertension V17.49 3. Maternal history of  Nephrolithiasis 4. Fraternal history of  Nephrolithiasis  Social History Problems  1. Caffeine Use 2. Former Smoker smoked 1 pack per week for 20 yrs and quit 3 yrs ago 3. 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 History of  Alcohol Use  Vitals Vital Signs [Data Includes: Last 1 Day]  13Mar2014 02:23PM  Blood Pressure: 106 / 69 Temperature: 98.4 F Heart Rate: 65  Results/Data  Urine [Data Includes: Last 1 Day]   13Mar2014  COLOR YELLOW   APPEARANCE CLEAR  SPECIFIC GRAVITY 1.030   pH 6.0   GLUCOSE NEG mg/dL  BILIRUBIN NEG   KETONE NEG mg/dL  BLOOD NEG   PROTEIN NEG mg/dL  UROBILINOGEN 0.2 mg/dL  NITRITE NEG   LEUKOCYTE ESTERASE NEG    Assessment Assessed  1. Chronic Interstitial Cystitis 595.1 2. Urge And Stress Incontinence 788.33 3. Urinary Frequency 788.41  Plan   Discussion/Summary   Ms.  Brenda Gray has a complicated voiding problem. I am very reluctant to recommend a sling in a patient who has such a small functional bladder capacity and who has interstitial cystitis. She certainly may not do well postoperatively. Her "total incontinence" can be related to her small spastic bladder and not her outlet. Her last culture was normal. Her urinalysis today looked normal.   [name] is going to have another Botox treatment with 200 units this time. Hopefully it lasts a lot longer. Once again, if she becomes nearly continent, it supports why I should not be doing a sling in her. I stressed that I was very concerned about her having a sling with her small sensitive bladder. I will measure her capacity under anesthesia again. Her back issues have been deferred and Interstim is another option moving forward. She is going away in April and ___   [name] understands that she does have theoretically induced history of ___ with sequelae with her voiding dysfunction issues and higher ____  After a thorough review of the management options for the patient's condition the patient  elected to proceed with surgical therapy as noted above. We have discussed the potential benefits and risks of the procedure, side effects of the proposed treatment, the likelihood of the patient achieving the goals of the procedure, and any potential problems that might occur during the procedure or recuperation. Informed consent has been obtained.

## 2012-12-08 ENCOUNTER — Encounter (HOSPITAL_BASED_OUTPATIENT_CLINIC_OR_DEPARTMENT_OTHER): Payer: Self-pay | Admitting: Anesthesiology

## 2012-12-08 ENCOUNTER — Encounter (HOSPITAL_BASED_OUTPATIENT_CLINIC_OR_DEPARTMENT_OTHER): Payer: Self-pay | Admitting: *Deleted

## 2012-12-08 ENCOUNTER — Encounter (HOSPITAL_BASED_OUTPATIENT_CLINIC_OR_DEPARTMENT_OTHER)
Admission: RE | Disposition: A | Discharge status: Home / Self Care | Payer: Self-pay | Source: Ambulatory Visit | Attending: Urology

## 2012-12-08 ENCOUNTER — Ambulatory Visit (HOSPITAL_BASED_OUTPATIENT_CLINIC_OR_DEPARTMENT_OTHER): Payer: BC Managed Care – PPO | Admitting: Anesthesiology

## 2012-12-08 ENCOUNTER — Ambulatory Visit (HOSPITAL_BASED_OUTPATIENT_CLINIC_OR_DEPARTMENT_OTHER)
Admission: RE | Admit: 2012-12-08 | Discharge: 2012-12-08 | Disposition: A | Discharge status: Home / Self Care | Payer: BC Managed Care – PPO | Source: Ambulatory Visit | Attending: Urology | Admitting: Urology

## 2012-12-08 DIAGNOSIS — N3010 Interstitial cystitis (chronic) without hematuria: Secondary | ICD-10-CM | POA: Diagnosis not present

## 2012-12-08 DIAGNOSIS — R350 Frequency of micturition: Secondary | ICD-10-CM | POA: Diagnosis not present

## 2012-12-08 DIAGNOSIS — K219 Gastro-esophageal reflux disease without esophagitis: Secondary | ICD-10-CM | POA: Insufficient documentation

## 2012-12-08 DIAGNOSIS — N3946 Mixed incontinence: Secondary | ICD-10-CM | POA: Diagnosis not present

## 2012-12-08 DIAGNOSIS — R51 Headache: Secondary | ICD-10-CM | POA: Insufficient documentation

## 2012-12-08 DIAGNOSIS — E7800 Pure hypercholesterolemia, unspecified: Secondary | ICD-10-CM | POA: Insufficient documentation

## 2012-12-08 DIAGNOSIS — Z79899 Other long term (current) drug therapy: Secondary | ICD-10-CM | POA: Insufficient documentation

## 2012-12-08 DIAGNOSIS — N3941 Urge incontinence: Secondary | ICD-10-CM | POA: Diagnosis not present

## 2012-12-08 DIAGNOSIS — Z8711 Personal history of peptic ulcer disease: Secondary | ICD-10-CM | POA: Insufficient documentation

## 2012-12-08 DIAGNOSIS — Z981 Arthrodesis status: Secondary | ICD-10-CM | POA: Insufficient documentation

## 2012-12-08 HISTORY — PX: CYSTOSCOPY WITH INJECTION: SHX1424

## 2012-12-08 LAB — POCT HEMOGLOBIN-HEMACUE: Hemoglobin: 14.2 g/dL (ref 12.0–15.0)

## 2012-12-08 SURGERY — CYSTOSCOPY, WITH INJECTION OF BLADDER NECK OR BLADDER WALL
Anesthesia: General | Site: Bladder | Wound class: Clean Contaminated

## 2012-12-08 MED ORDER — PROMETHAZINE HCL 25 MG/ML IJ SOLN
6.2500 mg | INTRAMUSCULAR | Status: DC | PRN
  Filled 2012-12-08: qty 1

## 2012-12-08 MED ORDER — STERILE WATER FOR IRRIGATION IR SOLN
Status: DC | PRN
  Administered 2012-12-08: 3000 mL

## 2012-12-08 MED ORDER — HYDROCODONE-ACETAMINOPHEN 5-325 MG PO TABS: 1.0000 | ORAL_TABLET | Freq: Four times a day (QID) | ORAL | Status: DC | PRN

## 2012-12-08 MED ORDER — MIDAZOLAM HCL 5 MG/5ML IJ SOLN
INTRAMUSCULAR | Status: DC | PRN
  Administered 2012-12-08: 2 mg via INTRAVENOUS

## 2012-12-08 MED ORDER — LACTATED RINGERS IV SOLN
INTRAVENOUS | Status: DC
  Administered 2012-12-08 (×2): via INTRAVENOUS
  Filled 2012-12-08: qty 1000

## 2012-12-08 MED ORDER — HYDROMORPHONE HCL PF 1 MG/ML IJ SOLN
0.2500 mg | INTRAMUSCULAR | Status: DC | PRN
  Filled 2012-12-08: qty 1

## 2012-12-08 MED ORDER — OXYCODONE HCL 5 MG/5ML PO SOLN
5.0000 mg | Freq: Once | ORAL | Status: DC | PRN
  Filled 2012-12-08: qty 5

## 2012-12-08 MED ORDER — FENTANYL CITRATE 0.05 MG/ML IJ SOLN
INTRAMUSCULAR | Status: DC | PRN
  Administered 2012-12-08: 50 ug via INTRAVENOUS

## 2012-12-08 MED ORDER — CIPROFLOXACIN IN D5W 400 MG/200ML IV SOLN
INTRAVENOUS | Status: DC | PRN
  Administered 2012-12-08: 400 mg via INTRAVENOUS

## 2012-12-08 MED ORDER — DEXAMETHASONE SODIUM PHOSPHATE 4 MG/ML IJ SOLN
INTRAMUSCULAR | Status: DC | PRN
  Administered 2012-12-08: 8 mg via INTRAVENOUS

## 2012-12-08 MED ORDER — PROPOFOL 10 MG/ML IV BOLUS
INTRAVENOUS | Status: DC | PRN
  Administered 2012-12-08: 170 mg via INTRAVENOUS

## 2012-12-08 MED ORDER — OXYCODONE HCL 5 MG PO TABS
5.0000 mg | ORAL_TABLET | Freq: Once | ORAL | Status: DC | PRN
  Filled 2012-12-08: qty 1

## 2012-12-08 MED ORDER — LIDOCAINE HCL (CARDIAC) 20 MG/ML IV SOLN
INTRAVENOUS | Status: DC | PRN
  Administered 2012-12-08: 50 mg via INTRAVENOUS

## 2012-12-08 MED ORDER — CIPROFLOXACIN IN D5W 400 MG/200ML IV SOLN
400.0000 mg | Freq: Two times a day (BID) | INTRAVENOUS | Status: DC
  Filled 2012-12-08: qty 200

## 2012-12-08 MED ORDER — ONDANSETRON HCL 4 MG/2ML IJ SOLN
INTRAMUSCULAR | Status: DC | PRN
  Administered 2012-12-08: 4 mg via INTRAVENOUS

## 2012-12-08 MED ORDER — CIPROFLOXACIN HCL 250 MG PO TABS: 250.0000 mg | ORAL_TABLET | Freq: Two times a day (BID) | ORAL | Status: DC

## 2012-12-08 MED ORDER — EPHEDRINE SULFATE 50 MG/ML IJ SOLN
INTRAMUSCULAR | Status: DC | PRN
  Administered 2012-12-08: 5 mg via INTRAVENOUS
  Administered 2012-12-08: 10 mg via INTRAVENOUS

## 2012-12-08 MED ORDER — MEPERIDINE HCL 25 MG/ML IJ SOLN
6.2500 mg | INTRAMUSCULAR | Status: DC | PRN
  Filled 2012-12-08: qty 1

## 2012-12-08 MED ORDER — ONABOTULINUMTOXINA 100 UNITS IJ SOLR
INTRAMUSCULAR | Status: DC | PRN
  Administered 2012-12-08: 200 [IU] via INTRAMUSCULAR

## 2012-12-08 MED ORDER — ACETAMINOPHEN 10 MG/ML IV SOLN
1000.0000 mg | Freq: Once | INTRAVENOUS | Status: DC | PRN
  Filled 2012-12-08: qty 100

## 2012-12-08 SURGICAL SUPPLY — 20 items
BAG DRAIN URO-CYSTO SKYTR STRL (DRAIN) ×2 IMPLANT
BAG DRN UROCATH (DRAIN) ×1
CANISTER SUCT LVC 12 LTR MEDI- (MISCELLANEOUS) IMPLANT
CATH ROBINSON RED A/P 12FR (CATHETERS) IMPLANT
CLOTH BEACON ORANGE TIMEOUT ST (SAFETY) ×2 IMPLANT
DRAPE CAMERA CLOSED 9X96 (DRAPES) ×2 IMPLANT
ELECT REM PT RETURN 9FT ADLT (ELECTROSURGICAL)
ELECTRODE REM PT RTRN 9FT ADLT (ELECTROSURGICAL) IMPLANT
GLOVE BIO SURGEON STRL SZ 6.5 (GLOVE) ×1 IMPLANT
GLOVE BIO SURGEON STRL SZ7 (GLOVE) ×1 IMPLANT
GLOVE BIO SURGEON STRL SZ7.5 (GLOVE) ×2 IMPLANT
GOWN PREVENTION PLUS LG XLONG (DISPOSABLE) ×2 IMPLANT
GOWN STRL REIN XL XLG (GOWN DISPOSABLE) ×2 IMPLANT
NDL SAFETY ECLIPSE 18X1.5 (NEEDLE) ×1 IMPLANT
NEEDLE HYPO 18GX1.5 SHARP (NEEDLE) ×2
PACK CYSTOSCOPY (CUSTOM PROCEDURE TRAY) ×2 IMPLANT
SYR 20CC LL (SYRINGE) IMPLANT
SYR 30ML LL (SYRINGE) ×5 IMPLANT
SYR BULB IRRIGATION 50ML (SYRINGE) ×1 IMPLANT
WATER STERILE IRR 3000ML UROMA (IV SOLUTION) ×2 IMPLANT

## 2012-12-08 NOTE — Progress Notes (Signed)
Dr Sherron Monday    In to visit with the patient and her mother

## 2012-12-08 NOTE — Interval H&P Note (Signed)
History and Physical Interval Note:  12/08/2012 7:28 AM  Brenda Gray  has presented today for surgery, with the diagnosis of URGE INCONTINENCE  The various methods of treatment have been discussed with the patient and family. After consideration of risks, benefits and other options for treatment, the patient has consented to  Procedure(s): CYSTOSCOPY WITH BOTOX INJECTION (N/A) as a surgical intervention .  The patient's history has been reviewed, patient examined, no change in status, stable for surgery.  I have reviewed the patient's chart and labs.  Questions were answered to the patient's satisfaction.     [name]

## 2012-12-08 NOTE — Anesthesia Preprocedure Evaluation (Signed)
Anesthesia Evaluation  Patient identified by MRN, date of birth, ID band Patient awake    Reviewed: Allergy & Precautions, H&P , NPO status , Patient's Chart, lab work & pertinent test results  Airway Mallampati: II TM Distance: >3 FB Neck ROM: Limited    Dental no notable dental hx. (+) Teeth Intact and Dental Advisory Given   Pulmonary neg pulmonary ROS,  breath sounds clear to auscultation  Pulmonary exam normal       Cardiovascular negative cardio ROS  Rhythm:Regular Rate:Normal     Neuro/Psych  Headaches (s/p cervical fusion x3. persistent H/A and left arm numbness and pain.), negative psych ROS   GI/Hepatic negative GI ROS, Neg liver ROS,   Endo/Other  negative endocrine ROS  Renal/GU negative Renal ROS     Musculoskeletal negative musculoskeletal ROS (+)   Abdominal   Peds  Hematology negative hematology ROS (+)   Anesthesia Other Findings   Reproductive/Obstetrics negative OB ROS                           Anesthesia Physical  Anesthesia Plan  ASA: II  Anesthesia Plan: General   Post-op Pain Management:    Induction: Intravenous  Airway Management Planned: LMA  Additional Equipment:   Intra-op Plan:   Post-operative Plan: Extubation in OR  Informed Consent: I have reviewed the patients History and Physical, chart, labs and discussed the procedure including the risks, benefits and alternatives for the proposed anesthesia with the patient or authorized representative who has indicated his/her understanding and acceptance.   Dental advisory given  Plan Discussed with: CRNA  Anesthesia Plan Comments:         Anesthesia Quick Evaluation

## 2012-12-08 NOTE — Op Note (Signed)
Preoperative diagnosis: Refractory urgency incontinence and urinary frequency Postoperative diagnosis: Refractory urgency incontinence urinary frequency Surgery: Cystoscopy and hydrodistention and injection of botulinum toxin Surgeon: Dr. Lorin Picket Elky Funches  The patient has the above diagnoses and consented above procedure. Ciprofloxacin was given. 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 My goal was also measure her capacity under anesthesia  She was hydrodistended with the ACMI scope under cystoscopic guidance to 600 mL. She start leaking around the scope. Her compassionate passes been 800 mL measure by Dr. Annabell Howells  With the bladder partially full I injected 200 units of Botox Nickson 20 cc and normal saline with my usual template at 5 and 4:09 and cephalad to the interureteric ridge in the midline. Appropriate depth injections were performed. There is minimal bleeding. Is no bladder injury. Trigone was spared  Bladder was emptied and patient taken to recover room.  On inspection the bladder there was no stitch or foreign body or carcinoma trigone ureteral orifices were normal

## 2012-12-08 NOTE — Anesthesia Postprocedure Evaluation (Signed)
Anesthesia Post Note  Patient: Brenda Gray  Procedure(s) Performed: Procedure(s) (LRB): CYSTOSCOPY WITH BOTOX INJECTION (N/A)  Anesthesia type: General  Patient location: PACU  Post pain: Pain level controlled  Post assessment: Post-op Vital signs reviewed  Last Vitals: BP 108/64  Pulse 73  Temp(Src) 36.4 C (Oral)  Resp 16  Ht 5\' 5"  (1.651 m)  Wt 138 lb (62.596 kg)  BMI 22.96 kg/m2  SpO2 100%  Post vital signs: Reviewed  Level of consciousness: sedated  Complications: No apparent anesthesia complications

## 2012-12-08 NOTE — Anesthesia Procedure Notes (Signed)
Procedure Name: LMA Insertion Date/Time: 12/08/2012 7:39 AM Performed by: Renella Cunas D Pre-anesthesia Checklist: Patient identified, Emergency Drugs available, Suction available and Patient being monitored Patient Re-evaluated:Patient Re-evaluated prior to inductionOxygen Delivery Method: Circle System Utilized Preoxygenation: Pre-oxygenation with 100% oxygen Intubation Type: IV induction Ventilation: Mask ventilation without difficulty LMA: LMA inserted LMA Size: 4.0 Number of attempts: 1 Airway Equipment and Method: bite block Placement Confirmation: positive ETCO2 Tube secured with: Tape Dental Injury: Teeth and Oropharynx as per pre-operative assessment

## 2012-12-08 NOTE — Transfer of Care (Signed)
Immediate Anesthesia Transfer of Care Note  Patient: Brenda Gray  Procedure(s) Performed: Procedure(s) (LRB): CYSTOSCOPY WITH BOTOX INJECTION (N/A)  Patient Location: PACU  Anesthesia Type: General  Level of Consciousness: awake, oriented, sedated and patient cooperative  Airway & Oxygen Therapy: Patient Spontanous Breathing and Patient connected to face mask oxygen  Post-op Assessment: Report given to PACU RN and Post -op Vital signs reviewed and stable  Post vital signs: Reviewed and stable  Complications: No apparent anesthesia complications

## 2012-12-08 NOTE — Progress Notes (Signed)
Assessment column on OR Incision/wound noted , urethral meatus clean & dry without dsg @ 0810 & 0830.

## 2012-12-09 ENCOUNTER — Encounter (HOSPITAL_BASED_OUTPATIENT_CLINIC_OR_DEPARTMENT_OTHER): Payer: Self-pay | Admitting: Urology

## 2012-12-17 DIAGNOSIS — IMO0002 Reserved for concepts with insufficient information to code with codable children: Secondary | ICD-10-CM | POA: Diagnosis not present

## 2012-12-17 DIAGNOSIS — M47817 Spondylosis without myelopathy or radiculopathy, lumbosacral region: Secondary | ICD-10-CM | POA: Diagnosis not present

## 2012-12-17 DIAGNOSIS — G894 Chronic pain syndrome: Secondary | ICD-10-CM | POA: Diagnosis not present

## 2012-12-25 ENCOUNTER — Ambulatory Visit (INDEPENDENT_AMBULATORY_CARE_PROVIDER_SITE_OTHER): Payer: BC Managed Care – PPO | Admitting: General Surgery

## 2012-12-25 VITALS — BP 124/70 | HR 108 | Temp 99.0°F | Resp 18 | Ht 65.0 in | Wt 135.8 lb

## 2012-12-25 DIAGNOSIS — K612 Anorectal abscess: Secondary | ICD-10-CM | POA: Diagnosis not present

## 2012-12-25 DIAGNOSIS — L0291 Cutaneous abscess, unspecified: Secondary | ICD-10-CM | POA: Diagnosis not present

## 2012-12-25 DIAGNOSIS — L0390 Cellulitis, unspecified: Secondary | ICD-10-CM | POA: Diagnosis not present

## 2012-12-25 MED ORDER — AMOXICILLIN-POT CLAVULANATE 875-125 MG PO TABS: 1.0000 | ORAL_TABLET | Freq: Two times a day (BID) | ORAL | Status: AC

## 2012-12-25 NOTE — Patient Instructions (Signed)
Remove bandage and packing in 24 hours.  Rinse wound with warm water twice a day and apply a dry pad to the area.  Call for heavy bleeding.

## 2012-12-25 NOTE — Progress Notes (Signed)
Patient ID: Brenda Gray, female   DOB: 1961-03-24, 52 y.o.   MRN: [id number]  No chief complaint on file.   HPI Brenda Gray is a 52 y.o. female.   HPI  She is referred by Dr. Eloise Harman for evaluation of a buttock abscess.  She first noticed some discomfort in the area about 2 weeks ago. Over the past 3 days the swelling has increased as well as the pain. She's been treated with warm compresses but this has not helped her symptoms.  Past Medical History  Diagnosis Date  . Cystitis, interstitial     frequency/urgency/nocturia  . Hyperlipidemia   . Numbness s/p cerival fusion    right side pain and numbness   . Pulmonary nodule noted in 2010    right upper lobe -- stable (monitored by pcp)  . Chronic headache S/P CERVICAL FUSION'S  . Mild acid reflux OCCASIONAL--  WATCHES DIET  . Status post dilation of esophageal narrowing 2011  . Urge incontinence of urine   . DDD (degenerative disc disease), cervical   . Dry eyes   . Frequency of urination   . Nocturia     Past Surgical History  Procedure Laterality Date  . Laparoscopy ovarian cystectomy  2000 (APPROX)  . Left rotator cuff repair  OCT 2009  . Cysto/ hod  03-10-2009    [facility]  . Cysto with hydrodistension  07/19/2011    Procedure: CYSTOSCOPY/HYDRODISTENSION;  Surgeon: Anner Crete;  Location: [facility];  Service: Urology;  Laterality: N/A;  CYSTOSCOPY WITH HYDORDISTENTION OF BLADDER AND INSTILLATION OF MARCAINE AND PYRIDIUM  . Cervical diskectomy and fusion  05-14-2002    C5 - 7  . Posterior fusion cervical spine  06-29-2009    C4 - 5  . Rotator cuff repair  2001    RIGHT SHOULDER  . Vaginal hysterectomy  1992  . Tonsillectomy  1981  . Cystoscopy with injection  06/17/2012    Procedure: CYSTOSCOPY WITH INJECTION;  Surgeon: Martina Sinner, MD;  Location: [facility];  Service: Urology;  Laterality: N/A;  BOTOX  . Cystoscopy with injection N/A 12/08/2012    Procedure: CYSTOSCOPY  WITH BOTOX INJECTION;  Surgeon: Martina Sinner, MD;  Location: [facility];  Service: Urology;  Laterality: N/A;    No family history on file.  Social History History  Substance Use Topics  . Smoking status: Former Smoker    Types: Cigarettes    Quit date: 07/16/2006  . Smokeless tobacco: Never Used  . Alcohol Use: Yes     Comment: rare    No Known Allergies  Current Outpatient Prescriptions  Medication Sig Dispense Refill  . calcium citrate-vitamin D 200-200 MG-UNIT TABS Take 2 tablets by mouth daily.        . ciprofloxacin (CIPRO) 250 MG tablet Take 1 tablet (250 mg total) by mouth 2 (two) times daily.  6 tablet  0  . ciprofloxacin (CIPRO) 250 MG tablet Take 1 tablet (250 mg total) by mouth 2 (two) times daily.  6 tablet  0  . cycloSPORINE (RESTASIS) 0.05 % ophthalmic emulsion Place 1 drop into both eyes 2 (two) times daily.      Marland Kitchen HYDROcodone-acetaminophen (NORCO) 5-325 MG per tablet Take 1 tablet by mouth every 6 (six) hours as needed.        Marland Kitchen HYDROcodone-acetaminophen (NORCO) 5-325 MG per tablet Take 1 tablet by mouth every 6 (six) hours as needed for pain.  30 tablet  0  .  HYDROcodone-acetaminophen (VICODIN) 5-500 MG per tablet Take 1-2 tablets by mouth every 6 (six) hours as needed for pain.  40 tablet  0  . Multiple Vitamin (MULTIVITAMIN) tablet Take 1 tablet by mouth daily.        . pravastatin (PRAVACHOL) 20 MG tablet Take 20 mg by mouth every evening.        . triazolam (HALCION) 0.25 MG tablet Take 0.25 mg by mouth every evening.        Marland Kitchen amoxicillin-clavulanate (AUGMENTIN) 875-125 MG per tablet Take 1 tablet by mouth every 12 (twelve) hours.  14 tablet  0   No current facility-administered medications for this visit.    Review of Systems Review of Systems  Constitutional: Negative for fever and chills.  Gastrointestinal: Negative for diarrhea and constipation.    Blood pressure 124/70, pulse 108, temperature 99 F (37.2 C), temperature source  Temporal, resp. rate 18, height 5\' 5"  (1.651 m), weight 135 lb 12.8 oz (61.598 kg).  Physical Exam Physical Exam  Constitutional: No distress.  She appears uncomfortable.  Genitourinary:  There is right perianal swelling with fluctuance, induration, and tenderness.    Data Reviewed Notes in EPIC.  Assessment    Right anorectal abscess.    Plan    Incision and drainage in the office. Augmentin for 7 days. Wound care instructions given.  Procedure: The right perianal area was sterilely prepped. It was anesthetized with Xylocaine with epinephrine. A full thickness elliptical incision was made in the fluctuant area. A moderate amount of purulent material was evacuated. Loculations of the abscess cavity were broken up bluntly. The abscess cavity was then packed tightly with quarter-inch iodoform gauze. A bulky dressing was applied. She tolerated the procedure well.        Eytan Carrigan J 12/25/2012, 2:34 PM

## 2012-12-29 ENCOUNTER — Telehealth (INDEPENDENT_AMBULATORY_CARE_PROVIDER_SITE_OTHER): Payer: Self-pay | Admitting: *Deleted

## 2012-12-29 DIAGNOSIS — N3946 Mixed incontinence: Secondary | ICD-10-CM | POA: Diagnosis not present

## 2012-12-29 DIAGNOSIS — N3010 Interstitial cystitis (chronic) without hematuria: Secondary | ICD-10-CM | POA: Diagnosis not present

## 2012-12-29 NOTE — Telephone Encounter (Signed)
Spoke to patient to let your her a new prescription for the Augmentin 875-125mg  BID #8 was called into [facility].  Patient states understanding at this time.  Patient had stated that she saw the dosage difference on her paper with the Augmentin ordered by Rosenbower MD for 875-125mg  and Ezzard Standing MD called in Augmentin 500mg  so she had been taking 1.5 pills BID.  Instructed patient to no longer continue that regimen and state on the new prescription for the remainder of the time.

## 2013-01-12 ENCOUNTER — Ambulatory Visit (INDEPENDENT_AMBULATORY_CARE_PROVIDER_SITE_OTHER): Payer: BC Managed Care – PPO | Admitting: General Surgery

## 2013-01-12 ENCOUNTER — Encounter (INDEPENDENT_AMBULATORY_CARE_PROVIDER_SITE_OTHER): Payer: Self-pay | Admitting: General Surgery

## 2013-01-12 VITALS — BP 102/78 | HR 70 | Temp 97.5°F | Resp 16 | Ht 65.0 in | Wt 135.0 lb

## 2013-01-12 DIAGNOSIS — Z9889 Other specified postprocedural states: Secondary | ICD-10-CM

## 2013-01-12 NOTE — Progress Notes (Signed)
She is here for her first visit following drainage of a right-sided anorectal abscess.  She is feeling much better. She has not had any drainage from the area and a couple days.  On exam, the right perianal wound is completely healed. No induration or erythema.  Assessment: Anorectal abscess is resolved following incision and drainage.  Plan: Return when necessary.

## 2013-01-12 NOTE — Patient Instructions (Signed)
Call if you have any problems with the area.

## 2013-01-15 ENCOUNTER — Telehealth (INDEPENDENT_AMBULATORY_CARE_PROVIDER_SITE_OTHER): Payer: Self-pay

## 2013-01-15 ENCOUNTER — Encounter (INDEPENDENT_AMBULATORY_CARE_PROVIDER_SITE_OTHER): Payer: Self-pay | Admitting: Surgery

## 2013-01-15 ENCOUNTER — Ambulatory Visit (INDEPENDENT_AMBULATORY_CARE_PROVIDER_SITE_OTHER): Payer: BC Managed Care – PPO | Admitting: Surgery

## 2013-01-15 VITALS — BP 126/76 | HR 84 | Temp 97.7°F | Resp 16 | Ht 65.0 in | Wt 135.4 lb

## 2013-01-15 DIAGNOSIS — K6289 Other specified diseases of anus and rectum: Secondary | ICD-10-CM

## 2013-01-15 NOTE — Progress Notes (Signed)
Subjective:     Patient ID: Brenda Gray, female   DOB: Sep 25, 1960, 52 y.o.   MRN: [id number]  HPI She is here for another visit. She actually had incision and drainage of a perirectal abscess by Dr. Abbey Chatters He just Saw her 3 days ago. She thinks she is developing a small cyst or abscess of the left perianal area. This area may have been present prior to her infection. She has only mild discomfort at the area  Review of Systems     Objective:   Physical Exam On exam, the previous I&D site is well-healed. There is a very small glandular cyst possibly 5 mm in size just to the left of the anterior midline. I used 18-gauge needle and unroofed the skin and no purulence    Assessment:     Perianal cyst     Plan:     Given her previous infection, I will place her on Augmentin. She will come back and see [place] in this area worsens

## 2013-01-15 NOTE — Telephone Encounter (Signed)
Pt returning call but unsure who tried to reach her. I advised her there is not msg in system but she has an appt today with Dr Magnus Ivan. Pt states she will keep appt.

## 2013-01-28 DIAGNOSIS — M5137 Other intervertebral disc degeneration, lumbosacral region: Secondary | ICD-10-CM | POA: Diagnosis not present

## 2013-01-28 DIAGNOSIS — M5030 Other cervical disc degeneration, unspecified cervical region: Secondary | ICD-10-CM | POA: Diagnosis not present

## 2013-01-28 DIAGNOSIS — M25519 Pain in unspecified shoulder: Secondary | ICD-10-CM | POA: Diagnosis not present

## 2013-01-29 DIAGNOSIS — N3010 Interstitial cystitis (chronic) without hematuria: Secondary | ICD-10-CM | POA: Diagnosis not present

## 2013-01-29 DIAGNOSIS — N3946 Mixed incontinence: Secondary | ICD-10-CM | POA: Diagnosis not present

## 2013-02-10 DIAGNOSIS — Z01419 Encounter for gynecological examination (general) (routine) without abnormal findings: Secondary | ICD-10-CM | POA: Diagnosis not present

## 2013-02-10 DIAGNOSIS — Z0000 Encounter for general adult medical examination without abnormal findings: Secondary | ICD-10-CM | POA: Diagnosis not present

## 2013-02-16 DIAGNOSIS — Z0000 Encounter for general adult medical examination without abnormal findings: Secondary | ICD-10-CM | POA: Diagnosis not present

## 2013-02-16 DIAGNOSIS — G4700 Insomnia, unspecified: Secondary | ICD-10-CM | POA: Diagnosis not present

## 2013-02-16 DIAGNOSIS — E785 Hyperlipidemia, unspecified: Secondary | ICD-10-CM | POA: Diagnosis not present

## 2013-02-16 DIAGNOSIS — M5137 Other intervertebral disc degeneration, lumbosacral region: Secondary | ICD-10-CM | POA: Diagnosis not present

## 2013-02-16 DIAGNOSIS — M25519 Pain in unspecified shoulder: Secondary | ICD-10-CM | POA: Diagnosis not present

## 2013-02-25 DIAGNOSIS — M5137 Other intervertebral disc degeneration, lumbosacral region: Secondary | ICD-10-CM | POA: Diagnosis not present

## 2013-02-25 DIAGNOSIS — M1710 Unilateral primary osteoarthritis, unspecified knee: Secondary | ICD-10-CM | POA: Diagnosis not present

## 2013-02-25 DIAGNOSIS — L0889 Other specified local infections of the skin and subcutaneous tissue: Secondary | ICD-10-CM | POA: Diagnosis not present

## 2013-02-25 DIAGNOSIS — L988 Other specified disorders of the skin and subcutaneous tissue: Secondary | ICD-10-CM | POA: Diagnosis not present

## 2013-02-25 DIAGNOSIS — M19049 Primary osteoarthritis, unspecified hand: Secondary | ICD-10-CM | POA: Diagnosis not present

## 2013-02-25 DIAGNOSIS — Z411 Encounter for cosmetic surgery: Secondary | ICD-10-CM | POA: Diagnosis not present

## 2013-02-25 DIAGNOSIS — M5030 Other cervical disc degeneration, unspecified cervical region: Secondary | ICD-10-CM | POA: Diagnosis not present

## 2013-04-06 DIAGNOSIS — R350 Frequency of micturition: Secondary | ICD-10-CM | POA: Diagnosis not present

## 2013-04-06 DIAGNOSIS — N3941 Urge incontinence: Secondary | ICD-10-CM | POA: Diagnosis not present

## 2013-04-14 ENCOUNTER — Other Ambulatory Visit: Payer: Self-pay | Admitting: Urology

## 2013-04-24 ENCOUNTER — Encounter (HOSPITAL_BASED_OUTPATIENT_CLINIC_OR_DEPARTMENT_OTHER): Payer: Self-pay | Admitting: *Deleted

## 2013-04-24 NOTE — Progress Notes (Signed)
NPO AFTER MN. ARRIVES AT 0600. PT COMING TO GET LAB WORK DONE (CBC, PT/INR, BMET) PRIOR TO DOS.  NEEDS TO HAVE T & S DOS ON ARRIVAL.

## 2013-04-27 DIAGNOSIS — Z79899 Other long term (current) drug therapy: Secondary | ICD-10-CM | POA: Diagnosis not present

## 2013-04-27 DIAGNOSIS — N3010 Interstitial cystitis (chronic) without hematuria: Secondary | ICD-10-CM | POA: Diagnosis not present

## 2013-04-27 DIAGNOSIS — E7800 Pure hypercholesterolemia, unspecified: Secondary | ICD-10-CM | POA: Diagnosis not present

## 2013-04-27 DIAGNOSIS — N3946 Mixed incontinence: Secondary | ICD-10-CM | POA: Diagnosis not present

## 2013-04-27 DIAGNOSIS — K219 Gastro-esophageal reflux disease without esophagitis: Secondary | ICD-10-CM | POA: Diagnosis not present

## 2013-04-27 LAB — APTT: aPTT: 34 seconds (ref 24–37)

## 2013-04-27 LAB — PROTIME-INR
INR: 0.87 (ref 0.00–1.49)
Prothrombin Time: 11.7 seconds (ref 11.6–15.2)

## 2013-04-27 LAB — BASIC METABOLIC PANEL
CO2: 30 mEq/L (ref 19–32)
Calcium: 9.3 mg/dL (ref 8.4–10.5)
Creatinine, Ser: 0.81 mg/dL (ref 0.50–1.10)
GFR calc Af Amer: >90 mL/min (ref >90)

## 2013-04-27 LAB — CBC
MCHC: 33.2 g/dL (ref 30.0–36.0)
RDW: 13.2 % (ref 11.5–15.5)

## 2013-04-30 ENCOUNTER — Ambulatory Visit: Admit: 2013-04-30 | Payer: Self-pay | Admitting: Urology

## 2013-04-30 SURGERY — CREATION, PUBOVAGINAL SLING
Anesthesia: General

## 2013-04-30 NOTE — H&P (Signed)
History of Present Illness   Unfortunately [name] has not responded to bilateral PNE. She can feel appropriate sensation in the vagina and rectal area and if she turns it up a lot she would feel it down her leg. I was very happy with the placement.   Her maximum capacity is only 47 mL. She has high pressure instability. She failed Botox. She had a positive hydrodistension for interstitial cystitis. We have always been concerned about doing a sling and this has been documented in great detail. She has [employer] and did not have PTNS.   She emptied efficiently when she thought the second Botox had made her stress incontinence worse and she may have been unmasking. Urodynamics have been performed in July 2013.   Today I was able to remove her dressing and both leads in their entirety. Skin looked excellent.   Her frequency is stable.    Past Medical History Problems  1. History of  Bladder Irrigation 2. History of  Esophageal Reflux 530.81 3. History of  Feelings Of Urinary Urgency 788.63 4. History of  Gastric Ulcer 531.90 5. History of  Hypercholesterolemia 272.0 6. History of  Nephrolithiasis V13.01 7. History of  Stress Incontinence 788.39 8. History of  Urinary Frequency 788.41  Surgical History Problems  1. History of  Bladder Irrigation 2. History of  Bladder Irrigation 3. History of  Cervical Vertebral Fusion 4. History of  Cystoscopy With Dilation Of Bladder 5. History of  Cystoscopy With Dilation Of Bladder 6. History of  Hysterectomy V45.77 7. History of  Neck Surgery 8. History of  Rotator Cuff Repair 9. History of  Rotator Cuff Repair 10. History of  Urologic Surgery  Current Meds 1. Calcium TABS; Therapy: (Recorded:23Oct2012) to 2. Cephalexin 250 MG Oral Capsule; TAKE 1 CAPSULE 3 times daily; Therapy: 22May2014 to  (Evaluate:25May2014)  Requested for: 22May2014; Last Rx:22May2014 3. Diazepam 5 MG Oral Tablet; Therapy: 27Jan2014 to 4.  Hydrocodone-Acetaminophen 5-325 MG Oral Tablet; Therapy: 27Jun2012 to 5. Multi-Vitamin Oral Tablet; Therapy: (Recorded:23Oct2012) to 6. Restasis 0.05 % Ophthalmic Emulsion; Therapy: 13May2013 to 7. Triazolam 0.25 MG Oral Tablet; Therapy: (Recorded:23Oct2012) to 8. Uribel 118 MG Oral Capsule; Therapy: (Recorded:13Mar2014) to  Allergies Medication  1. No Known Drug Allergies  Family History Problems  1. Family history of  Family Health Status Number Of Children 1 son 2. Maternal history of  Hypertension V17.49 3. Maternal history of  Nephrolithiasis 4. Fraternal history of  Nephrolithiasis  Social History Problems  1. Caffeine Use 2. Former Smoker smoked 1 pack per week for 20 yrs and quit 3 yrs ago 3. 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 History of  Alcohol Use  Results/Data  Urine [Data Includes: Last 1 Day]   04Aug2014  COLOR YELLOW   APPEARANCE CLEAR   SPECIFIC GRAVITY 1.020   pH 6.5   GLUCOSE NEG mg/dL  BILIRUBIN NEG   KETONE NEG mg/dL  BLOOD NEG   PROTEIN NEG mg/dL  UROBILINOGEN 0.2 mg/dL  NITRITE NEG   LEUKOCYTE ESTERASE NEG    Assessment Assessed  1. Urge And Stress Incontinence 788.33 2. Urinary Frequency 788.41  Plan   Discussion/Summary   I drew [name] a picture. We talked about, again, watchful waiting and no treatment versus sling. She understands her pathophysiology quite well.  We talked about a sling in detail. Pros, cons, general surgical and anesthetic risks, and other options including behavioral therapy and watchful waiting were discussed. She understands  that slings are generally successful in 90% of cases for stress incontinence, 50% for urge incontinence, and that in a small percentage of cases the incontinence can worsen. The risk of persistent, de novo, or worsening incontinence/dysfunction was discussed. Risks were described but not limited to the discussion of injury to  neighboring structures including the bowel (with possible life-threatening sepsis and colostomy), bladder, urethra, vagina (all resulting in further surgery), and ureter (resulting in re-implantation). We also talked about the risk of retention requiring urethrolysis, extrusion requiring revision, and erosion resulting in further surgery. Bleeding risks and transfusion rates and the risk of infection were discussed. The risk of pelvic and abdominal pain syndromes, dyspareunia, and neuropathies were discussed. The need for CIC was described as well as the usual postoperative course. The patient understands that she might not reach her treatment goal and that she might be worse following surgery. Mesh TV issues were discussed.  Worsening mixed stress and/or urge incontinence was also highlighted. Sling issue on TV.   I am not surprised that [name] would like to proceed with a sling.   There is no question she is still having stress incontinence with exercise, etc, and has limited her quality of life. IC issues have been discussed in the past.  After a thorough review of the management options for the patient's condition the patient  elected to proceed with surgical therapy as noted above. We have discussed the potential benefits and risks of the procedure, side effects of the proposed treatment, the likelihood of the patient achieving the goals of the procedure, and any potential problems that might occur during the procedure or recuperation. Informed consent has been obtained.

## 2013-05-01 ENCOUNTER — Encounter (HOSPITAL_BASED_OUTPATIENT_CLINIC_OR_DEPARTMENT_OTHER): Payer: Self-pay | Admitting: Anesthesiology

## 2013-05-01 ENCOUNTER — Ambulatory Visit (HOSPITAL_BASED_OUTPATIENT_CLINIC_OR_DEPARTMENT_OTHER): Payer: BC Managed Care – PPO | Admitting: Anesthesiology

## 2013-05-01 ENCOUNTER — Encounter (HOSPITAL_BASED_OUTPATIENT_CLINIC_OR_DEPARTMENT_OTHER): Payer: Self-pay | Admitting: *Deleted

## 2013-05-01 ENCOUNTER — Encounter (HOSPITAL_BASED_OUTPATIENT_CLINIC_OR_DEPARTMENT_OTHER)
Admission: RE | Disposition: A | Discharge status: Home / Self Care | Payer: Self-pay | Source: Ambulatory Visit | Attending: Urology

## 2013-05-01 ENCOUNTER — Ambulatory Visit (HOSPITAL_BASED_OUTPATIENT_CLINIC_OR_DEPARTMENT_OTHER)
Admission: RE | Admit: 2013-05-01 | Discharge: 2013-05-01 | Disposition: A | Discharge status: Home / Self Care | Payer: BC Managed Care – PPO | Source: Ambulatory Visit | Attending: Urology | Admitting: Urology

## 2013-05-01 DIAGNOSIS — K219 Gastro-esophageal reflux disease without esophagitis: Secondary | ICD-10-CM | POA: Insufficient documentation

## 2013-05-01 DIAGNOSIS — Z79899 Other long term (current) drug therapy: Secondary | ICD-10-CM | POA: Insufficient documentation

## 2013-05-01 DIAGNOSIS — N3010 Interstitial cystitis (chronic) without hematuria: Secondary | ICD-10-CM | POA: Insufficient documentation

## 2013-05-01 DIAGNOSIS — N3946 Mixed incontinence: Secondary | ICD-10-CM | POA: Insufficient documentation

## 2013-05-01 DIAGNOSIS — E7800 Pure hypercholesterolemia, unspecified: Secondary | ICD-10-CM | POA: Diagnosis not present

## 2013-05-01 DIAGNOSIS — N393 Stress incontinence (female) (male): Secondary | ICD-10-CM | POA: Diagnosis not present

## 2013-05-01 HISTORY — PX: PUBOVAGINAL SLING: SHX1035

## 2013-05-01 HISTORY — PX: CYSTOSCOPY: SHX5120

## 2013-05-01 LAB — ABO/RH: ABO/RH(D): O POS

## 2013-05-01 LAB — TYPE AND SCREEN: ABO/RH(D): O POS

## 2013-05-01 SURGERY — CREATION, PUBOVAGINAL SLING
Anesthesia: General | Site: Vagina | Wound class: Clean Contaminated

## 2013-05-01 MED ORDER — SODIUM CHLORIDE 0.9 % IR SOLN
Status: DC | PRN
  Administered 2013-05-01: 08:00:00

## 2013-05-01 MED ORDER — LIDOCAINE-EPINEPHRINE (PF) 1 %-1:200000 IJ SOLN
INTRAMUSCULAR | Status: DC | PRN
  Administered 2013-05-01: 10 mL

## 2013-05-01 MED ORDER — PROPOFOL 10 MG/ML IV BOLUS
INTRAVENOUS | Status: DC | PRN
  Administered 2013-05-01: 200 mg via INTRAVENOUS

## 2013-05-01 MED ORDER — FENTANYL CITRATE 0.05 MG/ML IJ SOLN
INTRAMUSCULAR | Status: DC | PRN
  Administered 2013-05-01: 50 ug via INTRAVENOUS

## 2013-05-01 MED ORDER — SUCCINYLCHOLINE CHLORIDE 20 MG/ML IJ SOLN
INTRAMUSCULAR | Status: DC | PRN
  Administered 2013-05-01: 100 mg via INTRAVENOUS

## 2013-05-01 MED ORDER — FENTANYL CITRATE 0.05 MG/ML IJ SOLN
25.0000 ug | INTRAMUSCULAR | Status: DC | PRN
  Administered 2013-05-01: 25 ug via INTRAVENOUS
  Administered 2013-05-01: 50 ug via INTRAVENOUS
  Administered 2013-05-01 (×2): 25 ug via INTRAVENOUS
  Filled 2013-05-01: qty 1

## 2013-05-01 MED ORDER — DEXAMETHASONE SODIUM PHOSPHATE 4 MG/ML IJ SOLN
INTRAMUSCULAR | Status: DC | PRN
  Administered 2013-05-01: 8 mg via INTRAVENOUS

## 2013-05-01 MED ORDER — CEFAZOLIN SODIUM 1-5 GM-% IV SOLN
1.0000 g | INTRAVENOUS | Status: AC
  Administered 2013-05-01: 2 g via INTRAVENOUS
  Filled 2013-05-01: qty 50

## 2013-05-01 MED ORDER — MIDAZOLAM HCL 5 MG/5ML IJ SOLN
INTRAMUSCULAR | Status: DC | PRN
  Administered 2013-05-01: 2 mg via INTRAVENOUS

## 2013-05-01 MED ORDER — HYDROCODONE-ACETAMINOPHEN 5-325 MG PO TABS: 1.0000 | ORAL_TABLET | Freq: Four times a day (QID) | ORAL | Status: DC | PRN

## 2013-05-01 MED ORDER — HYDROCODONE-ACETAMINOPHEN 5-325 MG PO TABS
1.0000 | ORAL_TABLET | Freq: Four times a day (QID) | ORAL | Status: DC | PRN
  Administered 2013-05-01: 1 via ORAL
  Filled 2013-05-01: qty 1

## 2013-05-01 MED ORDER — ONDANSETRON HCL 4 MG/2ML IJ SOLN
INTRAMUSCULAR | Status: DC | PRN
  Administered 2013-05-01: 4 mg via INTRAVENOUS

## 2013-05-01 MED ORDER — LACTATED RINGERS IV SOLN
INTRAVENOUS | Status: DC
  Administered 2013-05-01 (×2): via INTRAVENOUS
  Filled 2013-05-01: qty 1000

## 2013-05-01 MED ORDER — CIPROFLOXACIN HCL 250 MG PO TABS: 250.0000 mg | ORAL_TABLET | Freq: Two times a day (BID) | ORAL | Status: DC

## 2013-05-01 MED ORDER — LIDOCAINE HCL (CARDIAC) 20 MG/ML IV SOLN
INTRAVENOUS | Status: DC | PRN
  Administered 2013-05-01: 50 mg via INTRAVENOUS

## 2013-05-01 MED ORDER — INDIGOTINDISULFONATE SODIUM 8 MG/ML IJ SOLN
INTRAMUSCULAR | Status: DC | PRN
  Administered 2013-05-01: 5 mL via INTRAVENOUS

## 2013-05-01 MED ORDER — PROMETHAZINE HCL 25 MG/ML IJ SOLN
6.2500 mg | INTRAMUSCULAR | Status: DC | PRN
  Filled 2013-05-01: qty 1

## 2013-05-01 MED ORDER — GENTAMICIN SULFATE 40 MG/ML IJ SOLN
300.0000 mg | INTRAMUSCULAR | Status: AC
  Administered 2013-05-01: 300 mg via INTRAVENOUS
  Filled 2013-05-01: qty 7.5

## 2013-05-01 MED ORDER — STERILE WATER FOR IRRIGATION IR SOLN
Status: DC | PRN
  Administered 2013-05-01: 3000 mL

## 2013-05-01 MED ORDER — KETOROLAC TROMETHAMINE 30 MG/ML IJ SOLN
15.0000 mg | Freq: Once | INTRAMUSCULAR | Status: DC | PRN
  Filled 2013-05-01: qty 1

## 2013-05-01 MED ORDER — NEOSTIGMINE METHYLSULFATE 1 MG/ML IJ SOLN
INTRAMUSCULAR | Status: DC | PRN
  Administered 2013-05-01: 3 mg via INTRAVENOUS

## 2013-05-01 MED ORDER — GLYCOPYRROLATE 0.2 MG/ML IJ SOLN
INTRAMUSCULAR | Status: DC | PRN
  Administered 2013-05-01: 0.4 mg via INTRAVENOUS

## 2013-05-01 MED ORDER — GENTAMICIN IN SALINE 1.6-0.9 MG/ML-% IV SOLN
80.0000 mg | INTRAVENOUS | Status: DC
  Filled 2013-05-01: qty 50

## 2013-05-01 MED ORDER — ROCURONIUM BROMIDE 100 MG/10ML IV SOLN
INTRAVENOUS | Status: DC | PRN
  Administered 2013-05-01: 2.5 mg via INTRAVENOUS

## 2013-05-01 SURGICAL SUPPLY — 72 items
ADAPTER CATH URET PLST 4-6FR (CATHETERS) IMPLANT
ADH SKN CLS APL DERMABOND .7 (GAUZE/BANDAGES/DRESSINGS) ×2
ADPR CATH URET STRL DISP 4-6FR (CATHETERS)
BAG DRAIN URO-CYSTO SKYTR STRL (DRAIN) ×3 IMPLANT
BAG DRN UROCATH (DRAIN) ×2
BAG URINE DRAINAGE (UROLOGICAL SUPPLIES) IMPLANT
BALLN NEPHROSTOMY (BALLOONS)
BALLOON NEPHROSTOMY (BALLOONS) IMPLANT
BLADE HEX COATED 2.75 (ELECTRODE) ×3 IMPLANT
BLADE SURG 10 STRL SS (BLADE) ×3 IMPLANT
BLADE SURG 15 STRL LF DISP TIS (BLADE) ×2 IMPLANT
BLADE SURG 15 STRL SS (BLADE) ×3
BLADE SURG ROTATE 9660 (MISCELLANEOUS) ×3 IMPLANT
CANISTER SUCT LVC 12 LTR MEDI- (MISCELLANEOUS) IMPLANT
CANISTER SUCTION 1200CC (MISCELLANEOUS) ×3 IMPLANT
CATH FOLEY 2WAY SLVR  5CC 16FR (CATHETERS) ×1
CATH FOLEY 2WAY SLVR 5CC 16FR (CATHETERS) ×2 IMPLANT
CATH ROBINSON RED A/P 14FR (CATHETERS) ×3 IMPLANT
CATH URET 5FR 28IN CONE TIP (BALLOONS)
CATH URET 5FR 70CM CONE TIP (BALLOONS) IMPLANT
CLOTH BEACON ORANGE TIMEOUT ST (SAFETY) ×3 IMPLANT
COVER MAYO STAND STRL (DRAPES) ×6 IMPLANT
COVER TABLE BACK 60X90 (DRAPES) ×3 IMPLANT
DERMABOND ADVANCED (GAUZE/BANDAGES/DRESSINGS) ×1
DERMABOND ADVANCED .7 DNX12 (GAUZE/BANDAGES/DRESSINGS) ×2 IMPLANT
DRAIN PENROSE 18X1/4 LTX STRL (WOUND CARE) IMPLANT
DRAPE CAMERA CLOSED 9X96 (DRAPES) ×3 IMPLANT
DRAPE UNDERBUTTOCKS STRL (DRAPE) ×3 IMPLANT
ELECT REM PT RETURN 9FT ADLT (ELECTROSURGICAL) ×3
ELECTRODE REM PT RTRN 9FT ADLT (ELECTROSURGICAL) ×2 IMPLANT
GAUZE SPONGE 4X4 16PLY XRAY LF (GAUZE/BANDAGES/DRESSINGS) IMPLANT
GLOVE BIO SURGEON STRL SZ 6 (GLOVE) ×1 IMPLANT
GLOVE BIO SURGEON STRL SZ 6.5 (GLOVE) ×1 IMPLANT
GLOVE BIO SURGEON STRL SZ7.5 (GLOVE) ×6 IMPLANT
GLOVE BIOGEL PI IND STRL 7.5 (GLOVE) IMPLANT
GLOVE BIOGEL PI INDICATOR 7.5 (GLOVE) ×1
GOWN PREVENTION PLUS LG XLONG (DISPOSABLE) ×3 IMPLANT
GOWN STRL NON-REIN LRG LVL3 (GOWN DISPOSABLE) ×6 IMPLANT
GOWN STRL REIN XL XLG (GOWN DISPOSABLE) ×3 IMPLANT
GUIDEWIRE STR DUAL SENSOR (WIRE) ×3 IMPLANT
HOLDER FOLEY CATH W/STRAP (MISCELLANEOUS) ×3 IMPLANT
HOOK RETRACTION 12 ELAST STAY (MISCELLANEOUS) IMPLANT
NDL SAFETY ECLIPSE 18X1.5 (NEEDLE) IMPLANT
NEEDLE HYPO 18GX1.5 SHARP (NEEDLE)
NEEDLE HYPO 22GX1.5 SAFETY (NEEDLE) ×3 IMPLANT
NS IRRIG 500ML POUR BTL (IV SOLUTION) IMPLANT
PACK BASIN DAY SURGERY FS (CUSTOM PROCEDURE TRAY) ×3 IMPLANT
PACK CYSTOSCOPY (CUSTOM PROCEDURE TRAY) ×3 IMPLANT
PACKING VAGINAL (PACKING) ×3 IMPLANT
PENCIL BUTTON HOLSTER BLD 10FT (ELECTRODE) ×3 IMPLANT
PLUG CATH AND CAP STER (CATHETERS) ×3 IMPLANT
RETRACTOR STERILE 25.8CMX11.3 (INSTRUMENTS) IMPLANT
SET IRRIG Y TYPE TUR BLADDER L (SET/KITS/TRAYS/PACK) ×3 IMPLANT
SHEET LAVH (DRAPES) ×3 IMPLANT
SLING SYSTEM SPARC (Sling) ×1 IMPLANT
SURGILUBE 2OZ TUBE FLIPTOP (MISCELLANEOUS) ×3 IMPLANT
SUT SILK 2 0 30  PSL (SUTURE)
SUT SILK 2 0 30 PSL (SUTURE) IMPLANT
SUT VIC AB 2-0 CT1 27 (SUTURE) ×6
SUT VIC AB 2-0 CT1 TAPERPNT 27 (SUTURE) ×4 IMPLANT
SUT VICRYL 4-0 PS2 18IN ABS (SUTURE) ×3 IMPLANT
SYR 20CC LL (SYRINGE) IMPLANT
SYR BULB IRRIGATION 50ML (SYRINGE) ×3 IMPLANT
SYR CONTROL 10ML LL (SYRINGE) ×3 IMPLANT
SYRINGE 10CC LL (SYRINGE) ×3 IMPLANT
SYRINGE IRR TOOMEY STRL 70CC (SYRINGE) IMPLANT
TOWEL OR 17X24 6PK STRL BLUE (TOWEL DISPOSABLE) ×3 IMPLANT
TRAY DSU PREP LF (CUSTOM PROCEDURE TRAY) ×3 IMPLANT
TUBE CONNECTING 12X1/4 (SUCTIONS) ×6 IMPLANT
WATER STERILE IRR 3000ML UROMA (IV SOLUTION) ×3 IMPLANT
WATER STERILE IRR 500ML POUR (IV SOLUTION) ×1 IMPLANT
YANKAUER SUCT BULB TIP NO VENT (SUCTIONS) ×3 IMPLANT

## 2013-05-01 NOTE — Progress Notes (Signed)
Pt unable to void.  C/o pain 10/10.   Pt states she was unable to cath self d/t discomfort.  #16 fr. Foley inserted w/o difficulty.  550 cc blue urine obtained.  Pt expressed immediate relief.  Dr. Sherron Monday paged and informed.  Cath in until told to d/c.  Dr. Sherron Monday aware.

## 2013-05-01 NOTE — Transfer of Care (Signed)
Immediate Anesthesia Transfer of Care Note  Patient: Brenda Gray  Procedure(s) Performed: Procedure(s) (LRB):  [place] SLING (N/A) CYSTOSCOPY (N/A)  Patient Location: PACU  Anesthesia Type: General  Level of Consciousness: awake, oriented, sedated and patient cooperative  Airway & Oxygen Therapy: Patient Spontanous Breathing and Patient connected to face mask oxygen  Post-op Assessment: Report given to PACU RN and Post -op Vital signs reviewed and stable  Post vital signs: Reviewed and stable  Complications: No apparent anesthesia complications

## 2013-05-01 NOTE — Interval H&P Note (Signed)
History and Physical Interval Note:  05/01/2013 7:08 AM  Brenda Gray  has presented today for surgery, with the diagnosis of STRESS INCONTINENCE   The various methods of treatment have been discussed with the patient and family. After consideration of risks, benefits and other options for treatment, the patient has consented to  Procedure(s): SLING (N/A) CYSTOSCOPY (N/A) as a surgical intervention .  The patient's history has been reviewed, patient examined, no change in status, stable for surgery.  I have reviewed the patient's chart and labs.  Questions were answered to the patient's satisfaction.     Leoncio Hansen A

## 2013-05-01 NOTE — Anesthesia Preprocedure Evaluation (Signed)

## 2013-05-01 NOTE — Anesthesia Procedure Notes (Signed)
Procedure Name: Intubation Date/Time: 05/01/2013 7:29 AM Performed by: Renella Cunas D Pre-anesthesia Checklist: Patient identified, Emergency Drugs available, Suction available and Patient being monitored Patient Re-evaluated:Patient Re-evaluated prior to inductionOxygen Delivery Method: Circle System Utilized Preoxygenation: Pre-oxygenation with 100% oxygen Intubation Type: IV induction Ventilation: Mask ventilation without difficulty Laryngoscope Size: Mac and 3 Grade View: Grade II Tube type: Oral Tube size: 7.0 mm Number of attempts: 1 Airway Equipment and Method: stylet and oral airway Placement Confirmation: ETT inserted through vocal cords under direct vision,  positive ETCO2 and breath sounds checked- equal and bilateral Secured at: 22 cm Tube secured with: Tape Dental Injury: Teeth and Oropharynx as per pre-operative assessment  Comments: Small mouth

## 2013-05-01 NOTE — Anesthesia Postprocedure Evaluation (Signed)
  Anesthesia Post-op Note  Patient: Brenda Gray  Procedure(s) Performed: Procedure(s) (LRB):  [place] SLING (N/A) CYSTOSCOPY (N/A)  Patient Location: PACU  Anesthesia Type: General  Level of Consciousness: awake and alert   Airway and Oxygen Therapy: Patient Spontanous Breathing  Post-op Pain: mild  Post-op Assessment: Post-op Vital signs reviewed, Patient's Cardiovascular Status Stable, Respiratory Function Stable, Patent Airway and No signs of Nausea or vomiting  Last Vitals:  Filed Vitals:   05/01/13 0900  BP: 94/53  Pulse: 63  Temp:   Resp: 12    Post-op Vital Signs: stable   Complications: No apparent anesthesia complications

## 2013-05-01 NOTE — Op Note (Signed)
Preoperative diagnosis: Stress urinary incontinence and interstitial cystitis Postoperative diagnosis: Stress urinary incontinence and interstitial cystitis Surgery: Sling cystourethropexy [facility]) and cystoscopy Surgeon: Dr. Lorin Picket Anjani Feuerborn  The patient has the above diagnoses and consented above procedure. Preoperative laboratory tests were normal. Extra care was taken with leg positioning to minimize the risk of compartment syndrome and neuropathy and deep vein thrombosis. Preoperative antibiotics were given  Patient was prepped and draped in usual fashion. 21 [place] scope was utilized. I made to 1 cm incisions 1 fingerbreadth above the scissors pubis 1.5 cm lateral to the midline. I made a 2 cm suburethral sling in mid urethra. She had a narrow vagina but exposure was good. Incisions appropriate depth after instilling 3 cc a lidocaine epinephrine mixture. I bluntly and sharply dissected to the urethrovesical angle bilaterally  With the bladder emptied I passed the needle down on top of along the back of the scissors pubis of the pulpal my index finger bilaterally. Box technique was utilized.  I cystoscoped the patient. There is no injury to bladder or urethra. There was excellent blue jets bilaterally. There is no movement of bladder with movement of trocar. She start to have some glomerulations because of her interstitial cystitis. Very diffuse and impressive. They were oozing but did not need to urinate the bladder. There were no ulcers. I did not fully hydrodistended bladder but I didn't measure a gentle bladder capacity and was 500 mL or greater  With the bladder emptied I attached the sling and brought it up to the retropubic space. I tensioned it over the fat part of a moderate size Kelly clamp. I cut below the blue dots irrigated the sheaths and remove the sheaths. I was very happy with the position and tension of the sling with appropriate hypermobility with no springback. All incisions were  irrigated  Vaginal incision was closed with running 2-0 Vicryl followed by 2 interrupted sutures. Sling was cut below skin. Dermabond and one 4-0 Vicryl was utilized. She started to have modest oozing as I was closing likely from endopelvic fascia. For this reason I used the double pack. Hemodynamically she was fine. Urine was blue with good output. Patient was taken to recovery  Hopefully the patient will reacher treatment goal with an Ellik procedure recognizing she is a low functional capacity and interstitial cystitis

## 2013-05-04 ENCOUNTER — Encounter (HOSPITAL_BASED_OUTPATIENT_CLINIC_OR_DEPARTMENT_OTHER): Payer: Self-pay | Admitting: Urology

## 2013-06-09 ENCOUNTER — Encounter (INDEPENDENT_AMBULATORY_CARE_PROVIDER_SITE_OTHER): Payer: Self-pay

## 2013-08-18 ENCOUNTER — Other Ambulatory Visit: Payer: Self-pay | Admitting: Dermatology

## 2013-08-18 DIAGNOSIS — L570 Actinic keratosis: Secondary | ICD-10-CM | POA: Diagnosis not present

## 2013-08-18 DIAGNOSIS — C4441 Basal cell carcinoma of skin of scalp and neck: Secondary | ICD-10-CM | POA: Diagnosis not present

## 2013-09-30 ENCOUNTER — Other Ambulatory Visit: Payer: Self-pay | Admitting: Dermatology

## 2013-09-30 DIAGNOSIS — C44319 Basal cell carcinoma of skin of other parts of face: Secondary | ICD-10-CM | POA: Diagnosis not present

## 2013-09-30 DIAGNOSIS — L570 Actinic keratosis: Secondary | ICD-10-CM | POA: Diagnosis not present

## 2013-12-24 DIAGNOSIS — M19049 Primary osteoarthritis, unspecified hand: Secondary | ICD-10-CM | POA: Diagnosis not present

## 2013-12-24 DIAGNOSIS — M1710 Unilateral primary osteoarthritis, unspecified knee: Secondary | ICD-10-CM | POA: Diagnosis not present

## 2013-12-24 DIAGNOSIS — M5030 Other cervical disc degeneration, unspecified cervical region: Secondary | ICD-10-CM | POA: Diagnosis not present

## 2013-12-24 DIAGNOSIS — M5137 Other intervertebral disc degeneration, lumbosacral region: Secondary | ICD-10-CM | POA: Diagnosis not present

## 2014-02-08 DIAGNOSIS — IMO0002 Reserved for concepts with insufficient information to code with codable children: Secondary | ICD-10-CM | POA: Diagnosis not present

## 2014-02-16 DIAGNOSIS — M47812 Spondylosis without myelopathy or radiculopathy, cervical region: Secondary | ICD-10-CM | POA: Diagnosis not present

## 2014-02-16 DIAGNOSIS — M5412 Radiculopathy, cervical region: Secondary | ICD-10-CM | POA: Diagnosis not present

## 2014-02-17 DIAGNOSIS — M1710 Unilateral primary osteoarthritis, unspecified knee: Secondary | ICD-10-CM | POA: Diagnosis not present

## 2014-02-24 DIAGNOSIS — M1710 Unilateral primary osteoarthritis, unspecified knee: Secondary | ICD-10-CM | POA: Diagnosis not present

## 2014-03-03 DIAGNOSIS — M1710 Unilateral primary osteoarthritis, unspecified knee: Secondary | ICD-10-CM | POA: Diagnosis not present

## 2014-03-04 DIAGNOSIS — F329 Major depressive disorder, single episode, unspecified: Secondary | ICD-10-CM | POA: Diagnosis not present

## 2014-03-04 DIAGNOSIS — M5030 Other cervical disc degeneration, unspecified cervical region: Secondary | ICD-10-CM | POA: Diagnosis not present

## 2014-03-04 DIAGNOSIS — R059 Cough, unspecified: Secondary | ICD-10-CM | POA: Diagnosis not present

## 2014-03-04 DIAGNOSIS — R32 Unspecified urinary incontinence: Secondary | ICD-10-CM | POA: Diagnosis not present

## 2014-03-04 DIAGNOSIS — Z0000 Encounter for general adult medical examination without abnormal findings: Secondary | ICD-10-CM | POA: Diagnosis not present

## 2014-03-04 DIAGNOSIS — R05 Cough: Secondary | ICD-10-CM | POA: Diagnosis not present

## 2014-03-04 DIAGNOSIS — E785 Hyperlipidemia, unspecified: Secondary | ICD-10-CM | POA: Diagnosis not present

## 2014-03-04 DIAGNOSIS — M5137 Other intervertebral disc degeneration, lumbosacral region: Secondary | ICD-10-CM | POA: Diagnosis not present

## 2014-03-04 DIAGNOSIS — F3289 Other specified depressive episodes: Secondary | ICD-10-CM | POA: Diagnosis not present

## 2014-03-09 DIAGNOSIS — IMO0001 Reserved for inherently not codable concepts without codable children: Secondary | ICD-10-CM | POA: Diagnosis not present

## 2014-03-09 DIAGNOSIS — M542 Cervicalgia: Secondary | ICD-10-CM | POA: Diagnosis not present

## 2014-03-31 ENCOUNTER — Other Ambulatory Visit: Payer: Self-pay | Admitting: Physical Medicine and Rehabilitation

## 2014-03-31 DIAGNOSIS — M542 Cervicalgia: Secondary | ICD-10-CM

## 2014-04-08 ENCOUNTER — Ambulatory Visit
Admission: RE | Admit: 2014-04-08 | Discharge: 2014-04-08 | Disposition: A | Discharge status: Home / Self Care | Payer: BC Managed Care – PPO | Source: Ambulatory Visit | Attending: Physical Medicine and Rehabilitation | Admitting: Physical Medicine and Rehabilitation

## 2014-04-08 DIAGNOSIS — M47812 Spondylosis without myelopathy or radiculopathy, cervical region: Secondary | ICD-10-CM | POA: Diagnosis not present

## 2014-04-08 DIAGNOSIS — M4802 Spinal stenosis, cervical region: Secondary | ICD-10-CM | POA: Diagnosis not present

## 2014-04-08 DIAGNOSIS — M542 Cervicalgia: Secondary | ICD-10-CM

## 2014-04-08 MED ORDER — GADOBENATE DIMEGLUMINE 529 MG/ML IV SOLN
12.0000 mL | Freq: Once | INTRAVENOUS | Status: AC | PRN
  Administered 2014-04-08: 12 mL via INTRAVENOUS

## 2014-04-13 DIAGNOSIS — M961 Postlaminectomy syndrome, not elsewhere classified: Secondary | ICD-10-CM | POA: Diagnosis not present

## 2014-04-13 DIAGNOSIS — M542 Cervicalgia: Secondary | ICD-10-CM | POA: Diagnosis not present

## 2014-04-13 DIAGNOSIS — M47812 Spondylosis without myelopathy or radiculopathy, cervical region: Secondary | ICD-10-CM | POA: Diagnosis not present

## 2014-04-13 DIAGNOSIS — M5412 Radiculopathy, cervical region: Secondary | ICD-10-CM | POA: Diagnosis not present

## 2014-04-19 DIAGNOSIS — M47812 Spondylosis without myelopathy or radiculopathy, cervical region: Secondary | ICD-10-CM | POA: Diagnosis not present

## 2014-04-19 DIAGNOSIS — M542 Cervicalgia: Secondary | ICD-10-CM | POA: Diagnosis not present

## 2014-04-19 DIAGNOSIS — M961 Postlaminectomy syndrome, not elsewhere classified: Secondary | ICD-10-CM | POA: Diagnosis not present

## 2014-05-26 DIAGNOSIS — M1710 Unilateral primary osteoarthritis, unspecified knee: Secondary | ICD-10-CM | POA: Diagnosis not present

## 2014-05-26 DIAGNOSIS — M546 Pain in thoracic spine: Secondary | ICD-10-CM | POA: Diagnosis not present

## 2014-05-26 DIAGNOSIS — M19049 Primary osteoarthritis, unspecified hand: Secondary | ICD-10-CM | POA: Diagnosis not present

## 2014-05-26 DIAGNOSIS — M5137 Other intervertebral disc degeneration, lumbosacral region: Secondary | ICD-10-CM | POA: Diagnosis not present

## 2014-06-22 DIAGNOSIS — Z23 Encounter for immunization: Secondary | ICD-10-CM | POA: Diagnosis not present

## 2014-06-23 ENCOUNTER — Encounter: Payer: Self-pay | Admitting: Gastroenterology

## 2014-06-24 ENCOUNTER — Other Ambulatory Visit: Payer: Self-pay | Admitting: Dermatology

## 2014-06-24 DIAGNOSIS — Z85828 Personal history of other malignant neoplasm of skin: Secondary | ICD-10-CM | POA: Diagnosis not present

## 2014-06-24 DIAGNOSIS — L720 Epidermal cyst: Secondary | ICD-10-CM | POA: Diagnosis not present

## 2014-06-24 DIAGNOSIS — H02826 Cysts of left eye, unspecified eyelid: Secondary | ICD-10-CM | POA: Diagnosis not present

## 2014-06-24 DIAGNOSIS — D485 Neoplasm of uncertain behavior of skin: Secondary | ICD-10-CM | POA: Diagnosis not present

## 2014-08-03 DIAGNOSIS — C44319 Basal cell carcinoma of skin of other parts of face: Secondary | ICD-10-CM | POA: Diagnosis not present

## 2014-08-23 DIAGNOSIS — Z1231 Encounter for screening mammogram for malignant neoplasm of breast: Secondary | ICD-10-CM | POA: Diagnosis not present

## 2014-08-26 DIAGNOSIS — L918 Other hypertrophic disorders of the skin: Secondary | ICD-10-CM | POA: Diagnosis not present

## 2014-08-26 DIAGNOSIS — Z85828 Personal history of other malignant neoplasm of skin: Secondary | ICD-10-CM | POA: Diagnosis not present

## 2014-08-26 DIAGNOSIS — D239 Other benign neoplasm of skin, unspecified: Secondary | ICD-10-CM | POA: Diagnosis not present

## 2014-08-26 DIAGNOSIS — L720 Epidermal cyst: Secondary | ICD-10-CM | POA: Diagnosis not present

## 2014-10-04 DIAGNOSIS — M47812 Spondylosis without myelopathy or radiculopathy, cervical region: Secondary | ICD-10-CM | POA: Diagnosis not present

## 2014-10-04 DIAGNOSIS — M5416 Radiculopathy, lumbar region: Secondary | ICD-10-CM | POA: Diagnosis not present

## 2014-10-04 DIAGNOSIS — M542 Cervicalgia: Secondary | ICD-10-CM | POA: Diagnosis not present

## 2014-10-19 DIAGNOSIS — M542 Cervicalgia: Secondary | ICD-10-CM | POA: Diagnosis not present

## 2014-10-19 DIAGNOSIS — M47812 Spondylosis without myelopathy or radiculopathy, cervical region: Secondary | ICD-10-CM | POA: Diagnosis not present

## 2014-10-27 ENCOUNTER — Other Ambulatory Visit: Payer: Self-pay | Admitting: Dermatology

## 2014-10-27 DIAGNOSIS — D485 Neoplasm of uncertain behavior of skin: Secondary | ICD-10-CM | POA: Diagnosis not present

## 2015-01-26 DIAGNOSIS — M25512 Pain in left shoulder: Secondary | ICD-10-CM | POA: Diagnosis not present

## 2015-01-31 DIAGNOSIS — S39012D Strain of muscle, fascia and tendon of lower back, subsequent encounter: Secondary | ICD-10-CM | POA: Diagnosis not present

## 2015-01-31 DIAGNOSIS — M545 Low back pain: Secondary | ICD-10-CM | POA: Diagnosis not present

## 2015-03-10 DIAGNOSIS — Z1389 Encounter for screening for other disorder: Secondary | ICD-10-CM | POA: Diagnosis not present

## 2015-03-10 DIAGNOSIS — E785 Hyperlipidemia, unspecified: Secondary | ICD-10-CM | POA: Diagnosis not present

## 2015-03-10 DIAGNOSIS — K279 Peptic ulcer, site unspecified, unspecified as acute or chronic, without hemorrhage or perforation: Secondary | ICD-10-CM | POA: Diagnosis not present

## 2015-03-10 DIAGNOSIS — R635 Abnormal weight gain: Secondary | ICD-10-CM | POA: Diagnosis not present

## 2015-03-10 DIAGNOSIS — M5030 Other cervical disc degeneration, unspecified cervical region: Secondary | ICD-10-CM | POA: Diagnosis not present

## 2015-03-10 DIAGNOSIS — Z6822 Body mass index (BMI) 22.0-22.9, adult: Secondary | ICD-10-CM | POA: Diagnosis not present

## 2015-03-10 DIAGNOSIS — G4700 Insomnia, unspecified: Secondary | ICD-10-CM | POA: Diagnosis not present

## 2015-03-10 DIAGNOSIS — Z0000 Encounter for general adult medical examination without abnormal findings: Secondary | ICD-10-CM | POA: Diagnosis not present

## 2015-04-27 DIAGNOSIS — Z6823 Body mass index (BMI) 23.0-23.9, adult: Secondary | ICD-10-CM | POA: Diagnosis not present

## 2015-04-27 DIAGNOSIS — Z01419 Encounter for gynecological examination (general) (routine) without abnormal findings: Secondary | ICD-10-CM | POA: Diagnosis not present

## 2015-04-27 DIAGNOSIS — Z1389 Encounter for screening for other disorder: Secondary | ICD-10-CM | POA: Diagnosis not present

## 2015-07-05 DIAGNOSIS — M5416 Radiculopathy, lumbar region: Secondary | ICD-10-CM | POA: Diagnosis not present

## 2015-08-25 DIAGNOSIS — Z1231 Encounter for screening mammogram for malignant neoplasm of breast: Secondary | ICD-10-CM | POA: Diagnosis not present

## 2015-08-31 DIAGNOSIS — D2271 Melanocytic nevi of right lower limb, including hip: Secondary | ICD-10-CM | POA: Diagnosis not present

## 2015-08-31 DIAGNOSIS — L821 Other seborrheic keratosis: Secondary | ICD-10-CM | POA: Diagnosis not present

## 2015-08-31 DIAGNOSIS — D225 Melanocytic nevi of trunk: Secondary | ICD-10-CM | POA: Diagnosis not present

## 2015-08-31 DIAGNOSIS — L570 Actinic keratosis: Secondary | ICD-10-CM | POA: Diagnosis not present

## 2015-08-31 DIAGNOSIS — D1801 Hemangioma of skin and subcutaneous tissue: Secondary | ICD-10-CM | POA: Diagnosis not present

## 2015-08-31 DIAGNOSIS — D2272 Melanocytic nevi of left lower limb, including hip: Secondary | ICD-10-CM | POA: Diagnosis not present

## 2015-08-31 DIAGNOSIS — D2230 Melanocytic nevi of unspecified part of face: Secondary | ICD-10-CM | POA: Diagnosis not present

## 2015-08-31 DIAGNOSIS — L814 Other melanin hyperpigmentation: Secondary | ICD-10-CM | POA: Diagnosis not present

## 2015-08-31 DIAGNOSIS — D485 Neoplasm of uncertain behavior of skin: Secondary | ICD-10-CM | POA: Diagnosis not present

## 2015-08-31 DIAGNOSIS — L918 Other hypertrophic disorders of the skin: Secondary | ICD-10-CM | POA: Diagnosis not present

## 2015-08-31 DIAGNOSIS — L578 Other skin changes due to chronic exposure to nonionizing radiation: Secondary | ICD-10-CM | POA: Diagnosis not present

## 2015-08-31 DIAGNOSIS — Z85828 Personal history of other malignant neoplasm of skin: Secondary | ICD-10-CM | POA: Diagnosis not present

## 2015-12-12 DIAGNOSIS — M1712 Unilateral primary osteoarthritis, left knee: Secondary | ICD-10-CM | POA: Diagnosis not present

## 2015-12-12 DIAGNOSIS — M19241 Secondary osteoarthritis, right hand: Secondary | ICD-10-CM | POA: Diagnosis not present

## 2015-12-12 DIAGNOSIS — M25561 Pain in right knee: Secondary | ICD-10-CM | POA: Diagnosis not present

## 2015-12-12 DIAGNOSIS — M5137 Other intervertebral disc degeneration, lumbosacral region: Secondary | ICD-10-CM | POA: Diagnosis not present

## 2016-02-10 DIAGNOSIS — M1712 Unilateral primary osteoarthritis, left knee: Secondary | ICD-10-CM | POA: Diagnosis not present

## 2016-02-17 DIAGNOSIS — M1711 Unilateral primary osteoarthritis, right knee: Secondary | ICD-10-CM | POA: Diagnosis not present

## 2016-02-24 DIAGNOSIS — M1711 Unilateral primary osteoarthritis, right knee: Secondary | ICD-10-CM | POA: Diagnosis not present

## 2016-03-20 DIAGNOSIS — Z87891 Personal history of nicotine dependence: Secondary | ICD-10-CM | POA: Diagnosis not present

## 2016-03-20 DIAGNOSIS — E784 Other hyperlipidemia: Secondary | ICD-10-CM | POA: Diagnosis not present

## 2016-03-20 DIAGNOSIS — C44310 Basal cell carcinoma of skin of unspecified parts of face: Secondary | ICD-10-CM | POA: Diagnosis not present

## 2016-03-20 DIAGNOSIS — N200 Calculus of kidney: Secondary | ICD-10-CM | POA: Diagnosis not present

## 2016-03-20 DIAGNOSIS — Z6822 Body mass index (BMI) 22.0-22.9, adult: Secondary | ICD-10-CM | POA: Diagnosis not present

## 2016-03-20 DIAGNOSIS — Z1389 Encounter for screening for other disorder: Secondary | ICD-10-CM | POA: Diagnosis not present

## 2016-03-20 DIAGNOSIS — M5030 Other cervical disc degeneration, unspecified cervical region: Secondary | ICD-10-CM | POA: Diagnosis not present

## 2016-03-20 DIAGNOSIS — Z0000 Encounter for general adult medical examination without abnormal findings: Secondary | ICD-10-CM | POA: Diagnosis not present

## 2016-04-10 DIAGNOSIS — M5416 Radiculopathy, lumbar region: Secondary | ICD-10-CM | POA: Diagnosis not present

## 2016-04-18 DIAGNOSIS — M791 Myalgia: Secondary | ICD-10-CM | POA: Diagnosis not present

## 2016-04-18 DIAGNOSIS — M542 Cervicalgia: Secondary | ICD-10-CM | POA: Diagnosis not present

## 2016-06-07 DIAGNOSIS — Z1389 Encounter for screening for other disorder: Secondary | ICD-10-CM | POA: Diagnosis not present

## 2016-06-07 DIAGNOSIS — Z130 Encounter for screening for diseases of the blood and blood-forming organs and certain disorders involving the immune mechanism: Secondary | ICD-10-CM | POA: Diagnosis not present

## 2016-06-07 DIAGNOSIS — Z01419 Encounter for gynecological examination (general) (routine) without abnormal findings: Secondary | ICD-10-CM | POA: Diagnosis not present

## 2016-06-07 DIAGNOSIS — Z6821 Body mass index (BMI) 21.0-21.9, adult: Secondary | ICD-10-CM | POA: Diagnosis not present

## 2016-06-11 DIAGNOSIS — Z23 Encounter for immunization: Secondary | ICD-10-CM | POA: Diagnosis not present

## 2016-06-11 DIAGNOSIS — L820 Inflamed seborrheic keratosis: Secondary | ICD-10-CM | POA: Diagnosis not present

## 2016-06-11 DIAGNOSIS — L309 Dermatitis, unspecified: Secondary | ICD-10-CM | POA: Diagnosis not present

## 2016-06-11 DIAGNOSIS — D485 Neoplasm of uncertain behavior of skin: Secondary | ICD-10-CM | POA: Diagnosis not present

## 2016-08-24 DIAGNOSIS — Z23 Encounter for immunization: Secondary | ICD-10-CM | POA: Diagnosis not present

## 2016-08-24 DIAGNOSIS — D2230 Melanocytic nevi of unspecified part of face: Secondary | ICD-10-CM | POA: Diagnosis not present

## 2016-08-24 DIAGNOSIS — D225 Melanocytic nevi of trunk: Secondary | ICD-10-CM | POA: Diagnosis not present

## 2016-08-24 DIAGNOSIS — Z85828 Personal history of other malignant neoplasm of skin: Secondary | ICD-10-CM | POA: Diagnosis not present

## 2016-08-24 DIAGNOSIS — L723 Sebaceous cyst: Secondary | ICD-10-CM | POA: Diagnosis not present

## 2016-08-24 DIAGNOSIS — D2271 Melanocytic nevi of right lower limb, including hip: Secondary | ICD-10-CM | POA: Diagnosis not present

## 2016-08-24 DIAGNOSIS — D2272 Melanocytic nevi of left lower limb, including hip: Secondary | ICD-10-CM | POA: Diagnosis not present

## 2016-08-24 DIAGNOSIS — L570 Actinic keratosis: Secondary | ICD-10-CM | POA: Diagnosis not present

## 2016-08-24 DIAGNOSIS — L700 Acne vulgaris: Secondary | ICD-10-CM | POA: Diagnosis not present

## 2016-10-03 ENCOUNTER — Telehealth: Payer: Self-pay | Admitting: *Deleted

## 2016-10-03 NOTE — Telephone Encounter (Signed)
Patient needs an appointment to discuss her bone density scan results. Attempted to contact the patient and left message for patient to call the office.

## 2016-10-04 NOTE — Telephone Encounter (Signed)
Patient states she is not going to have insurance after 10/10/16 and states she will call back to make an appointment after she is sure how her insurance situation will be. Unable to make an appointment to discuss the options for her bone density results.

## 2016-10-15 ENCOUNTER — Ambulatory Visit (INDEPENDENT_AMBULATORY_CARE_PROVIDER_SITE_OTHER): Payer: BLUE CROSS/BLUE SHIELD | Admitting: Orthopedic Surgery

## 2016-10-15 ENCOUNTER — Ambulatory Visit (INDEPENDENT_AMBULATORY_CARE_PROVIDER_SITE_OTHER): Payer: BLUE CROSS/BLUE SHIELD

## 2016-10-15 ENCOUNTER — Encounter (INDEPENDENT_AMBULATORY_CARE_PROVIDER_SITE_OTHER): Payer: Self-pay | Admitting: Orthopedic Surgery

## 2016-10-15 VITALS — Ht 65.0 in | Wt 131.0 lb

## 2016-10-15 DIAGNOSIS — G5761 Lesion of plantar nerve, right lower limb: Secondary | ICD-10-CM

## 2016-10-15 DIAGNOSIS — M79671 Pain in right foot: Secondary | ICD-10-CM

## 2016-10-15 MED ORDER — METHYLPREDNISOLONE ACETATE 40 MG/ML IJ SUSP
40.0000 mg | INTRAMUSCULAR | Status: AC | PRN
  Administered 2016-10-15: 40 mg via INTRA_ARTICULAR

## 2016-10-15 MED ORDER — LIDOCAINE HCL 1 % IJ SOLN
1.0000 mL | INTRAMUSCULAR | Status: AC | PRN
  Administered 2016-10-15: 1 mL

## 2016-10-15 NOTE — Progress Notes (Signed)
Office Visit Note   Patient: Brenda Gray           Date of Birth: [birth year]           MRN: [id number] Visit Date: 10/15/2016              Requested by: Leanna Battles, MD [address] [place], [place] [address] PCP: Donnajean Lopes, MD  Chief Complaint  Patient presents with  . Right Foot - Pain    HPI: Patient is her for right foot pain. States that this began  A few months ago but states over this weekend it has gotten worse. She states that a leaf blower fell on her foot and caused bruising and swelling. Began as intermittent pain but now its so painful to weight bear the pt states it " brings tears to my eyes"  She is full weight bearing in a regular shoe and states that she has used tramadol for pain and this has not helped. States that nothing helps. She states that it feels like her 4th toe feels like it is pulling and very painful to touch around the [place]. Pamella Pert, RMA    Assessment & Plan: Visit Diagnoses:  1. Morton neuroma, right   2. Pain in right foot     Plan: The Morton's neuroma was injected she tolerated this well recommended wider shoe wear follow-up as needed discussed that she may require additional traction if the one injection does not resolve all of her symptoms.  Follow-Up Instructions: Return if symptoms worsen or fail to improve.   Ortho Exam Examination patient is alert oriented no adenopathy well-dressed normal affect normal respiratory effort she has good pulses she has good ankle and subtalar motion she is exquisitely tender to palpation over the third webspace lateral compression produces a clunk and reproduces her pain. The metatarsal heads are nontender to palpation her radiographs are normal.  Imaging: Xr Foot 2 Views Right  Result Date: 10/15/2016 2 view radiographs of the right foot shows no bony abnormalities no periarticular cystic changes. She previously has had surgery performed on the little toe.   Orders:  Orders  Placed This Encounter  Procedures  . XR Foot 2 Views Right   No orders of the defined types were placed in this encounter.    Procedures: Small Joint Inj Date/Time: 10/15/2016 4:34 PM Performed by: Omega Slager V Authorized by: Newt Minion   Consent Given by:  Patient Site marked: the procedure site was marked   Timeout: prior to procedure the correct patient, procedure, and site was verified   Indications:  Pain and diagnostic evaluation Location:  Foot Site:  R intertarsal Prep: patient was prepped and draped in usual sterile fashion   Needle Size:  22 G Spinal Needle: No   Approach:  Dorsal Ultrasound Guided: No   Fluoroscopic Guidance: No   Medications:  1 mL lidocaine 1 %; 40 mg methylPREDNISolone acetate 40 MG/ML Aspiration Attempted: No   Patient tolerance:  Patient tolerated the procedure well with no immediate complications    Clinical Data: No additional findings.  Subjective: Review of Systems  Objective: Vital Signs: Ht 5\' 5"  (1.651 m)   Wt 131 lb (59.4 kg)   BMI 21.80 kg/m   Specialty Comments:  No specialty comments available.  PMFS History: Patient Active Problem List   Diagnosis Date Noted  . Morton neuroma, right 10/15/2016  . DYSPHAGIA UNSPECIFIED 07/31/2010  . ABDOMINAL PAIN, EPIGASTRIC 07/31/2010   Past Medical  History:  Diagnosis Date  . Chronic headache S/P CERVICAL FUSION'S  . Cystitis, interstitial    frequency/urgency/nocturia  . DDD (degenerative disc disease), cervical   . Dry eyes   . Frequency of urination   . Hyperlipidemia   . Mild acid reflux OCCASIONAL--  WATCHES DIET  . Nocturia   . Numbness s/p cerival fusion   right side pain and numbness   . Pulmonary nodule noted in 2010   right upper lobe -- stable (monitored by pcp)  . Status post dilation of esophageal narrowing 2011  . Urge incontinence of urine     No family history on file.  Past Surgical History:  Procedure Laterality Date  . CERVICAL DISKECTOMY  AND FUSION  05-14-2002   C5 - 7  . CYSTO WITH HYDRODISTENSION  07/19/2011   Procedure: CYSTOSCOPY/HYDRODISTENSION;  Surgeon: Malka So;  Location: [place];  Service: Urology;  Laterality: N/A;  CYSTOSCOPY WITH HYDORDISTENTION OF BLADDER AND INSTILLATION OF MARCAINE AND PYRIDIUM  . cysto/ hod  03-10-2009   [facility]  . CYSTOSCOPY N/A 05/01/2013   Procedure: CYSTOSCOPY;  Surgeon: Reece Packer, MD;  Location: [facility];  Service: Urology;  Laterality: N/A;  . CYSTOSCOPY WITH INJECTION  06/17/2012   Procedure: CYSTOSCOPY WITH INJECTION;  Surgeon: Reece Packer, MD;  Location: [place];  Service: Urology;  Laterality: N/A;  BOTOX  . CYSTOSCOPY WITH INJECTION N/A 12/08/2012   Procedure: CYSTOSCOPY WITH BOTOX INJECTION;  Surgeon: Reece Packer, MD;  Location: [place];  Service: Urology;  Laterality: N/A;  . laparoscopy ovarian cystectomy  2000 (APPROX)  . left rotator cuff repair  OCT 2009  . POSTERIOR FUSION CERVICAL SPINE  06-29-2009   C4 - 5  . PUBOVAGINAL SLING N/A 05/01/2013   Procedure:  Lowella Dell;  Surgeon: Reece Packer, MD;  Location: [facility];  Service: Urology;  Laterality: N/A;  . ROTATOR CUFF REPAIR  2001   RIGHT SHOULDER  . TONSILLECTOMY  1981  . VAGINAL HYSTERECTOMY  1992   Social History   Occupational History  . Not on file.   Social History Main Topics  . Smoking status: Former Smoker    Types: Cigarettes    Quit date: 07/16/2006  . Smokeless tobacco: Never Used  . Alcohol use Yes     Comment: rare  . Drug use: No  . Sexual activity: Not on file

## 2016-10-29 DIAGNOSIS — M19041 Primary osteoarthritis, right hand: Secondary | ICD-10-CM | POA: Insufficient documentation

## 2016-10-29 DIAGNOSIS — M5134 Other intervertebral disc degeneration, thoracic region: Secondary | ICD-10-CM | POA: Insufficient documentation

## 2016-10-29 DIAGNOSIS — M47816 Spondylosis without myelopathy or radiculopathy, lumbar region: Secondary | ICD-10-CM | POA: Insufficient documentation

## 2016-10-29 DIAGNOSIS — M170 Bilateral primary osteoarthritis of knee: Secondary | ICD-10-CM | POA: Insufficient documentation

## 2016-10-29 DIAGNOSIS — M19042 Primary osteoarthritis, left hand: Secondary | ICD-10-CM

## 2016-10-29 DIAGNOSIS — M47812 Spondylosis without myelopathy or radiculopathy, cervical region: Secondary | ICD-10-CM | POA: Insufficient documentation

## 2016-10-29 DIAGNOSIS — F5101 Primary insomnia: Secondary | ICD-10-CM | POA: Insufficient documentation

## 2016-10-29 DIAGNOSIS — Z87440 Personal history of urinary (tract) infections: Secondary | ICD-10-CM | POA: Insufficient documentation

## 2016-10-29 DIAGNOSIS — M19032 Primary osteoarthritis, left wrist: Secondary | ICD-10-CM | POA: Insufficient documentation

## 2016-10-29 NOTE — Progress Notes (Signed)
Office Visit Note  Patient: Brenda Gray             Date of Birth: 1960/10/30           MRN: [id number]             PCP: Donnajean Lopes, MD Referring: Leanna Battles, MD Visit Date: 11/05/2016 Occupation: _0 @    Subjective:  Follow-up on osteoporosis   History of Present Illness: Brenda Gray is a 56 y.o. female with history of osteoarthritis and disc disease. She states she had a bone density done by her GYN which revealed osteoporosis and she wanted to have evaluation and treatment for that. She continues to have some discomfort in her cervical thoracic and lumbar spine due to underlying disc disease. She's been having increased pain in her knee joints. She had Visco supplement injections about a year ago. Her hands are stiff. She denies any joint swelling.   Activities of Daily Living:  Patient reports morning stiffness for 1 hour.   Patient Denies nocturnal pain.  Difficulty dressing/grooming: Denies Difficulty climbing stairs: Denies Difficulty getting out of chair: Denies Difficulty using hands for taps, buttons, cutlery, and/or writing: Reports   Review of Systems  Constitutional: Negative for fatigue, night sweats, weight gain, weight loss and weakness.  HENT: Negative for mouth sores, trouble swallowing, trouble swallowing, mouth dryness and nose dryness.   Eyes: Negative for pain, redness, visual disturbance and dryness.  Respiratory: Negative for cough, shortness of breath and difficulty breathing.   Cardiovascular: Negative for chest pain, palpitations, hypertension, irregular heartbeat and swelling in legs/feet.  Gastrointestinal: Negative for blood in stool, constipation and diarrhea.  Endocrine: Negative for increased urination.  Genitourinary: Negative for vaginal dryness.  Musculoskeletal: Positive for arthralgias, joint pain and morning stiffness. Negative for joint swelling, myalgias, muscle weakness, muscle tenderness and myalgias.    Skin: Negative for color change, rash, hair loss, skin tightness, ulcers and sensitivity to sunlight.  Allergic/Immunologic: Negative for susceptible to infections.  Neurological: Negative for dizziness, memory loss and night sweats.  Hematological: Negative for swollen glands.  Psychiatric/Behavioral: Negative for depressed mood and sleep disturbance. The patient is not nervous/anxious.     PMFS History:  Patient Active Problem List   Diagnosis Date Noted  . S/P rotator cuff repair 11/04/2016  . Dyslipidemia 11/04/2016  . History of peptic ulcer disease 11/04/2016  . Interstitial cystitis 11/04/2016  . DJD (degenerative joint disease), cervical 10/29/2016  . Spondylosis of lumbar region without myelopathy or radiculopathy 10/29/2016  . Primary osteoarthritis of both knees 10/29/2016  . 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 Primary insomnia 10/29/2016  . History of recurrent cystitis 10/29/2016  . Primary osteoarthritis of both hands 10/29/2016  . Morton neuroma, right 10/15/2016  . DYSPHAGIA UNSPECIFIED 07/31/2010  . ABDOMINAL PAIN, EPIGASTRIC 07/31/2010    Past Medical History:  Diagnosis Date  . Chronic headache S/P CERVICAL FUSION'S  . Cystitis, interstitial    frequency/urgency/nocturia  . DDD (degenerative disc disease), cervical   . Dry eyes   . Frequency of urination   . Hyperlipidemia   . Mild acid reflux OCCASIONAL--  WATCHES DIET  . Nocturia   . Numbness s/p cerival fusion   right side pain and numbness   . Pulmonary nodule noted in 2010   right upper lobe -- stable (monitored by pcp)  . Status post dilation of esophageal narrowing 2011  . Urge incontinence of urine     No family history on file. Past  Surgical History:  Procedure Laterality Date  . CERVICAL DISKECTOMY AND FUSION  05-14-2002   C5 - 7  . CYSTO WITH HYDRODISTENSION  07/19/2011   Procedure: CYSTOSCOPY/HYDRODISTENSION;  Surgeon: Malka So;  Location: [place];  Service: Urology;  Laterality: N/A;  CYSTOSCOPY WITH HYDORDISTENTION OF BLADDER AND INSTILLATION OF MARCAINE AND PYRIDIUM  . cysto/ hod  03-10-2009   [facility]  . CYSTOSCOPY N/A 05/01/2013   Procedure: CYSTOSCOPY;  Surgeon: Reece Packer, MD;  Location: [facility];  Service: Urology;  Laterality: N/A;  . CYSTOSCOPY WITH INJECTION  06/17/2012   Procedure: CYSTOSCOPY WITH INJECTION;  Surgeon: Reece Packer, MD;  Location: [place];  Service: Urology;  Laterality: N/A;  BOTOX  . CYSTOSCOPY WITH INJECTION N/A 12/08/2012   Procedure: CYSTOSCOPY WITH BOTOX INJECTION;  Surgeon: Reece Packer, MD;  Location: [place];  Service: Urology;  Laterality: N/A;  . laparoscopy ovarian cystectomy  2000 (APPROX)  . left rotator cuff repair  OCT 2009  . POSTERIOR FUSION CERVICAL SPINE  06-29-2009   C4 - 5  . PUBOVAGINAL SLING N/A 05/01/2013   Procedure:  Lowella Dell;  Surgeon: Reece Packer, MD;  Location: [facility];  Service: Urology;  Laterality: N/A;  . ROTATOR CUFF REPAIR  2001   RIGHT SHOULDER  . TONSILLECTOMY  1981  . VAGINAL HYSTERECTOMY  1992   Social History   Social History Narrative  . No narrative on file     Objective: Vital Signs: BP 134/70 (BP Location: Right Arm, Patient Position: Sitting)   Pulse 68   Resp 14   Ht _0  (1.676 m)   Wt 138 lb (62.6 kg)   BMI 22.27 kg/m    Physical Exam  Constitutional: She is oriented to person, place, and time. She appears well-developed and well-nourished.  HENT:  Head: Normocephalic and atraumatic.  Eyes: Conjunctivae and EOM are normal.  Neck: Normal range of motion.  Cardiovascular: Normal rate, regular rhythm, normal heart sounds and intact distal pulses.   Pulmonary/Chest: Effort normal and breath sounds normal.  Abdominal: Soft. Bowel sounds are normal.  Lymphadenopathy:    She has no cervical adenopathy.  Neurological: She is alert and  oriented to person, place, and time.  Skin: Skin is warm and dry. Capillary refill takes less than 2 seconds.  Psychiatric: She has a normal mood and affect. Her behavior is normal.  Nursing note and vitals reviewed.    Musculoskeletal Exam: C-spine limited range of motion with some discomfort., Lumbar spine and thoracic spine good range of motion. Shoulder joints elbow joints are good range of motion. She has thickening of PIP/DIP joints in her hands consistent with osteoarthritis with incomplete extension of the PIP joints. Hip joints knee joints ankles MTPs PIPs with good range of motion with no synovitis. CDAI Exam: No CDAI exam completed.    Investigation: Findings:  :  Labs from May 21. 2014 shows CBC is normal, sed rate is normal, CMP is normal, hep panel is negative, uric acid is negative, CK is normal, angiotensin 1 converting enzyme is negative, TSH is negative, HIV test is negative, rheumatoid factor negative, ANA negative, SPEP M spike is negative, CCP is negative, HLA-B27 negative, vitamin D is normal at 50.    An x-ray of the C-spine from February 2013 showed a C3 to C5 fusion with hardware.  01/28/2013 We obtained x-rays of the bilateral hands in the office today.  X-rays  of the bilateral hands, AP and oblique views, show bilateral PIP and DIP narrowing.  No MCP changes.  No intracarpal joint changes.  No erosive changes consistent with osteoarthritis.  A left knee joint x-ray, AP and lateral views, show moderate medial compartment narrowing without any chondrocalcinosis.  There is mild patellofemoral narrowing consistent with moderate osteoarthritis.    DEXA scan done on 08/27/2016 showed T score of -3.0 in the spine region with a BMD of 0.718 there is a decrease of -10%    Imaging: Xr Foot 2 Views Right  Result Date: 10/15/2016 2 view radiographs of the right foot shows no bony abnormalities no periarticular cystic changes. She previously has had surgery performed on the little  toe.   Speciality Comments: No specialty comments available.    Procedures:  No procedures performed Allergies: Cymbalta [duloxetine hcl] and Lyrica [pregabalin]   Assessment / Plan:     Visit Diagnoses: Age-related osteoporosis without current pathological fracture: Skeletal showed deterioration of her bone mass by -10% and a T score of -3.0. We had detailed discussion regarding different treatment options and their side effects. She has had history of peptic ulcer disease and gastritis. Oral bisphosphonates will not be an option. We discussed the option of IV Reclast. Indications side effects contraindications were discussed at length. We will proceed with IV Reclast. She will need CBC CMP and vitamin D and intact PTH prior to the infusion.  DJD (degenerative joint disease), cervical - Status post fusion 3. She continues to have some stiffness in her C-spine  DDD (degenerative disc disease), thoracic: Chronic pain  Spondylosis of lumbar region without myelopathy or radiculopathy: Chronic pain  Primary osteoarthritis of both hands: On going a stiffness  Primary osteoarthritis of both knees: She's been having increased pain in her knee joints and had responded quite well to Misco supplement injections in the past. We will try to be certain her for Hyalgan  S/P rotator cuff repair - Bilateral. She continues to have discomfort in her left shoulder.  Her other medical problems are listed as follows:  Primary insomnia  Dyslipidemia  History of peptic ulcer disease  Interstitial cystitis    Orders: Orders Placed This Encounter  Procedures  . CBC with Differential/Platelet  . COMPLETE METABOLIC PANEL WITH GFR  . Protein electrophoresis, serum  . VITAMIN D 25 Hydroxy (Vit-D Deficiency, Fractures)  . Magnesium  . Parathyroid hormone, intact (no Ca)   No orders of the defined types were placed in this encounter.   Face-to-face time spent with patient was 30  minutes. 50% of  time was spent in counseling and coordination of care.  Follow-Up Instructions: Return in about 4 months (around 03/05/2017) for Osteoporosis, Osteoarthritis.   Bo Merino, MD  Note - This record has been created using Editor, commissioning.  Chart creation errors have been sought, but may not always  have been located. Such creation errors do not reflect on  the standard of medical care.

## 2016-11-04 DIAGNOSIS — N3010 Interstitial cystitis (chronic) without hematuria: Secondary | ICD-10-CM | POA: Insufficient documentation

## 2016-11-04 DIAGNOSIS — Z8711 Personal history of peptic ulcer disease: Secondary | ICD-10-CM | POA: Insufficient documentation

## 2016-11-04 DIAGNOSIS — Z9889 Other specified postprocedural states: Secondary | ICD-10-CM | POA: Insufficient documentation

## 2016-11-04 DIAGNOSIS — E785 Hyperlipidemia, unspecified: Secondary | ICD-10-CM | POA: Insufficient documentation

## 2016-11-05 ENCOUNTER — Telehealth: Payer: Self-pay

## 2016-11-05 ENCOUNTER — Ambulatory Visit (INDEPENDENT_AMBULATORY_CARE_PROVIDER_SITE_OTHER): Payer: Medicare Other | Admitting: Rheumatology

## 2016-11-05 ENCOUNTER — Encounter: Payer: Self-pay | Admitting: Rheumatology

## 2016-11-05 VITALS — BP 134/70 | HR 68 | Resp 14 | Ht 66.0 in | Wt 138.0 lb

## 2016-11-05 DIAGNOSIS — M19041 Primary osteoarthritis, right hand: Secondary | ICD-10-CM

## 2016-11-05 DIAGNOSIS — M47816 Spondylosis without myelopathy or radiculopathy, lumbar region: Secondary | ICD-10-CM | POA: Diagnosis not present

## 2016-11-05 DIAGNOSIS — M5030 Other cervical disc degeneration, unspecified cervical region: Secondary | ICD-10-CM | POA: Diagnosis not present

## 2016-11-05 DIAGNOSIS — N3010 Interstitial cystitis (chronic) without hematuria: Secondary | ICD-10-CM | POA: Diagnosis not present

## 2016-11-05 DIAGNOSIS — Z9889 Other specified postprocedural states: Secondary | ICD-10-CM | POA: Diagnosis not present

## 2016-11-05 DIAGNOSIS — M5134 Other intervertebral disc degeneration, thoracic region: Secondary | ICD-10-CM | POA: Diagnosis not present

## 2016-11-05 DIAGNOSIS — M170 Bilateral primary osteoarthritis of knee: Secondary | ICD-10-CM

## 2016-11-05 DIAGNOSIS — F5101 Primary insomnia: Secondary | ICD-10-CM | POA: Diagnosis not present

## 2016-11-05 DIAGNOSIS — E785 Hyperlipidemia, unspecified: Secondary | ICD-10-CM | POA: Diagnosis not present

## 2016-11-05 DIAGNOSIS — Z8711 Personal history of peptic ulcer disease: Secondary | ICD-10-CM

## 2016-11-05 DIAGNOSIS — M19042 Primary osteoarthritis, left hand: Secondary | ICD-10-CM

## 2016-11-05 DIAGNOSIS — M810 Age-related osteoporosis without current pathological fracture: Secondary | ICD-10-CM | POA: Diagnosis not present

## 2016-11-05 DIAGNOSIS — M47812 Spondylosis without myelopathy or radiculopathy, cervical region: Secondary | ICD-10-CM

## 2016-11-05 LAB — CBC WITH DIFFERENTIAL/PLATELET
BASOS PCT: 0 %
Basophils Absolute: 0 cells/uL (ref 0–200)
Eosinophils Absolute: 57 cells/uL (ref 15–500)
Eosinophils Relative: 1 %
HEMATOCRIT: 41.3 % (ref 35.0–45.0)
Hemoglobin: 13.4 g/dL (ref 11.7–15.5)
LYMPHS ABS: 1767 {cells}/uL (ref 850–3900)
Lymphocytes Relative: 31 %
MCH: 27.7 pg (ref 27.0–33.0)
MCHC: 32.4 g/dL (ref 32.0–36.0)
MCV: 85.5 fL (ref 80.0–100.0)
MONO ABS: 456 {cells}/uL (ref 200–950)
MPV: 9 fL (ref 7.5–12.5)
Monocytes Relative: 8 %
Neutro Abs: 3420 cells/uL (ref 1500–7800)
Neutrophils Relative %: 60 %
PLATELETS: 237 10*3/uL (ref 140–400)
RBC: 4.83 MIL/uL (ref 3.80–5.10)
RDW: 13.9 % (ref 11.0–15.0)
WBC: 5.7 10*3/uL (ref 3.8–10.8)

## 2016-11-05 LAB — COMPLETE METABOLIC PANEL WITH GFR
ALT: 17 U/L (ref 6–29)
AST: 21 U/L (ref 10–35)
Albumin: 4.3 g/dL (ref 3.6–5.1)
Alkaline Phosphatase: 65 U/L (ref 33–130)
BUN: 18 mg/dL (ref 7–25)
CALCIUM: 9.7 mg/dL (ref 8.6–10.4)
CO2: 29 mmol/L (ref 20–31)
Chloride: 106 mmol/L (ref 98–110)
Creat: 0.77 mg/dL (ref 0.50–1.05)
GFR, Est African American: >89 mL/min (ref >=60)
GFR, Est Non African American: 85 mL/min (ref >=60)
Glucose, Bld: 77 mg/dL (ref 65–99)
Potassium: 4.7 mmol/L (ref 3.5–5.3)
Sodium: 142 mmol/L (ref 135–146)
TOTAL PROTEIN: 6.5 g/dL (ref 6.1–8.1)
Total Bilirubin: 0.5 mg/dL (ref 0.2–1.2)

## 2016-11-05 NOTE — Telephone Encounter (Signed)
Called [employer] to check the patients benefits for reclast infusion. Spoke with Reuben Likes who states that the patient will not require an authorization until December 09, 2016. She then transferred me to the benefits department. Spoke with Marlyne Beards who states the patient has as $4000 max out of pocket. She has a $750 deductible  that must be paid and then she will be responsible for 20% of the cost of the services for the infusion.   Phone: [phone] Reference number: [id number]  Spoke with Ms.Sforza to inform her of the cost.  Someone will reach out to her to schedule an infusion once we receive her lab results. Patient voiced understanding and states that she is willing to pay the cost.  She had not further questions at this time.   Eusebio Blazejewski, [place], CPhT 10:24 AM

## 2016-11-05 NOTE — Patient Instructions (Signed)
Zoledronic Acid injection (Paget's Disease, Osteoporosis) °What is this medicine? °ZOLEDRONIC ACID (ZOE le dron ik AS id) lowers the amount of calcium loss from bone. It is used to treat Paget's disease and osteoporosis in women. °COMMON BRAND NAME(S): Reclast, Zometa °What should I tell my health care provider before I take this medicine? °They need to know if you have any of these conditions: °-aspirin-sensitive asthma °-cancer, especially if you are receiving medicines used to treat cancer °-dental disease or wear dentures °-infection °-kidney disease °-low levels of calcium in the blood °-past surgery on the parathyroid gland or intestines °-receiving corticosteroids like dexamethasone or prednisone °-an unusual or allergic reaction to zoledronic acid, other medicines, foods, dyes, or preservatives °-pregnant or trying to get pregnant °-breast-feeding °How should I use this medicine? °This medicine is for infusion into a vein. It is given by a health care professional in a hospital or clinic setting. °Talk to your pediatrician regarding the use of this medicine in children. This medicine is not approved for use in children. °What if I miss a dose? °It is important not to miss your dose. Call your doctor or health care professional if you are unable to keep an appointment. °What may interact with this medicine? °-certain antibiotics given by injection °-NSAIDs, medicines for pain and inflammation, like ibuprofen or naproxen °-some diuretics like bumetanide, furosemide °-teriparatide °What should I watch for while using this medicine? °Visit your doctor or health care professional for regular checkups. It may be some time before you see the benefit from this medicine. Do not stop taking your medicine unless your doctor tells you to. Your doctor may order blood tests or other tests to see how you are doing. °Women should inform their doctor if they wish to become pregnant or think they might be pregnant. There is a  potential for serious side effects to an unborn child. Talk to your health care professional or pharmacist for more information. °You should make sure that you get enough calcium and vitamin D while you are taking this medicine. Discuss the foods you eat and the vitamins you take with your health care professional. °Some people who take this medicine have severe bone, joint, and/or muscle pain. This medicine may also increase your risk for jaw problems or a broken thigh bone. Tell your doctor right away if you have severe pain in your jaw, bones, joints, or muscles. Tell your doctor if you have any pain that does not go away or that gets worse. °Tell your dentist and dental surgeon that you are taking this medicine. You should not have major dental surgery while on this medicine. See your dentist to have a dental exam and fix any dental problems before starting this medicine. Take good care of your teeth while on this medicine. Make sure you see your dentist for regular follow-up appointments. °What side effects may I notice from receiving this medicine? °Side effects that you should report to your doctor or health care professional as soon as possible: °-allergic reactions like skin rash, itching or hives, swelling of the face, lips, or tongue °-anxiety, confusion, or depression °-breathing problems °-changes in vision °-eye pain °-feeling faint or lightheaded, falls °-jaw pain, especially after dental work °-mouth sores °-muscle cramps, stiffness, or weakness °-redness, blistering, peeling or loosening of the skin, including inside the mouth °-trouble passing urine or change in the amount of urine °Side effects that usually do not require medical attention (report to your doctor or health care professional if   they continue or are bothersome): °-bone, joint, or muscle pain °-constipation °-diarrhea °-fever °-hair loss °-irritation at site where injected °-loss of appetite °-nausea, vomiting °-stomach  upset °-trouble sleeping °-trouble swallowing °-weak or tired °Where should I keep my medicine? °This drug is given in a hospital or clinic and will not be stored at home. °© 2017 Elsevier/Gold Standard (2014-01-23 14:19:57) ° °

## 2016-11-05 NOTE — Progress Notes (Signed)
Pharmacy Note  Subjective: Patient presents today to the [facility] to see Dr. Estanislado Pandy.  Patient seen by pharmacist for counseling on bisphosphonate therapy.    Objective: T-score: - 3 (08/27/16) Calcium: ordered today  Vitamin D: ordered today   Assessment/Plan: Counseled patient that zoledronic acid (Reclast) is an intravenous bisphosphonate that reduces bone turnover by inhibiting osteoclasts that chew up bone.  Counseled patient on purpose, proper use, and adverse effects of Reclast.  Reviewed with patient that Reclast should be taken once yearly.  Reviewed importance of taking calcium and vitamin D with bisphosphonate therapy.  Patient confirms she is already taking calcium/vitamin D.  Provided patient with medication education material and answered all questions.  Reviewed adverse events of Reclast including risk of nausea & diarrhea, headache, and muscle & bone pain.  Reviewed rare adverse effect of osteonecrosis of the jaw and advised patient to alert her dentist that she is on Reclast prior to any major dental work.  Patient confirms she does not have any major dental work scheduled at this time.  Patient agrees to trial of Reclast at this time.  Patient is wanting to get Reclast infusion before her insurance changes at the end of the month.  Will submit pre-certification through patient's insurance and will update her as soon as I know status.    Elisabeth Most, Pharm.D., BCPS Clinical Pharmacist Pager: [phone] Phone: [phone] 11/05/2016 8:44 AM

## 2016-11-06 ENCOUNTER — Telehealth: Payer: Self-pay | Admitting: Pharmacist

## 2016-11-06 ENCOUNTER — Other Ambulatory Visit: Payer: Self-pay | Admitting: Radiology

## 2016-11-06 DIAGNOSIS — M810 Age-related osteoporosis without current pathological fracture: Secondary | ICD-10-CM

## 2016-11-06 LAB — MAGNESIUM: Magnesium: 1.9 mg/dL (ref 1.5–2.5)

## 2016-11-06 LAB — PARATHYROID HORMONE, INTACT (NO CA): PTH: 20 pg/mL (ref 14–64)

## 2016-11-06 LAB — VITAMIN D 25 HYDROXY (VIT D DEFICIENCY, FRACTURES): Vit D, 25-Hydroxy: 41 ng/mL (ref 30–100)

## 2016-11-06 MED ORDER — ZOLEDRONIC ACID 5 MG/100ML IV SOLN: 5.0000 mg | Freq: Once | INTRAVENOUS | Status: AC

## 2016-11-06 NOTE — Telephone Encounter (Signed)
Okay to schedule Reclast infusion

## 2016-11-06 NOTE — Progress Notes (Signed)
Called patient to advise reclast orders have been placed for her and she can call [facility] to schedule. I have provided her the number to call, hopefully they can get her in prior to her insurance terming.

## 2016-11-06 NOTE — Telephone Encounter (Signed)
Thank you orders sent  Have asked patient to call to schedule, she states her insurance will term soon and she is ready to schedule as soon as possible

## 2016-11-06 NOTE — Telephone Encounter (Signed)
Received lab results from Rito Ehrlich lab tech.    CMP normal, CBC normal, magnesium normal, PTH intact normal, vitamin D 41 SPEP still pending (may take 1 to 3 days to result)  Patient was wanting to try to get Reclast infusion before the end of the month.  Do you want to proceed with Reclast infusion?

## 2016-11-07 LAB — PROTEIN ELECTROPHORESIS, SERUM
ALPHA-2-GLOBULIN: 0.7 g/dL (ref 0.5–0.9)
Albumin ELP: 4.2 g/dL (ref 3.8–4.8)
Alpha-1-Globulin: 0.3 g/dL (ref 0.2–0.3)
Beta 2: 0.3 g/dL (ref 0.2–0.5)
Beta Globulin: 0.4 g/dL (ref 0.4–0.6)
GAMMA GLOBULIN: 0.6 g/dL — AB (ref 0.8–1.7)
Total Protein, Serum Electrophoresis: 6.5 g/dL (ref 6.1–8.1)

## 2016-11-07 NOTE — Progress Notes (Signed)
Labs normal.

## 2016-11-08 ENCOUNTER — Other Ambulatory Visit: Payer: Self-pay | Admitting: Radiology

## 2016-11-08 DIAGNOSIS — M810 Age-related osteoporosis without current pathological fracture: Secondary | ICD-10-CM

## 2016-11-08 NOTE — Progress Notes (Signed)
Additional order placed for Reclast, Brenda Gray at [facility] has indicated she can not view the order, and it is not signed/ held.

## 2016-11-09 ENCOUNTER — Ambulatory Visit (HOSPITAL_COMMUNITY)
Admission: RE | Admit: 2016-11-09 | Discharge: 2016-11-09 | Disposition: A | Discharge status: Home / Self Care | Payer: Medicare Other | Source: Ambulatory Visit | Attending: Rheumatology | Admitting: Rheumatology

## 2016-11-09 DIAGNOSIS — M810 Age-related osteoporosis without current pathological fracture: Secondary | ICD-10-CM | POA: Diagnosis not present

## 2016-11-09 MED ORDER — SODIUM CHLORIDE 0.9 % IV SOLN: Freq: Once | INTRAVENOUS | Status: DC

## 2016-11-09 MED ORDER — ZOLEDRONIC ACID 5 MG/100ML IV SOLN
INTRAVENOUS | Status: AC
  Administered 2016-11-09: 08:00:00 5 mg
  Filled 2016-11-09: qty 100

## 2016-11-09 MED ORDER — ZOLEDRONIC ACID 5 MG/100ML IV SOLN: 5.0000 mg | Freq: Once | INTRAVENOUS | Status: DC

## 2016-11-09 NOTE — Discharge Instructions (Signed)

## 2016-12-06 ENCOUNTER — Ambulatory Visit (INDEPENDENT_AMBULATORY_CARE_PROVIDER_SITE_OTHER): Payer: Medicare Other | Admitting: Family

## 2016-12-06 DIAGNOSIS — G5761 Lesion of plantar nerve, right lower limb: Secondary | ICD-10-CM

## 2016-12-06 NOTE — Progress Notes (Signed)
Office Visit Note   Patient: Brenda Gray           Date of Birth: December 08, 1960           MRN: [id number] Visit Date: 12/06/2016              Requested by: Leanna Battles, MD [place], [place] [id number] PCP: Donnajean Lopes, MD  No chief complaint on file.   HPI: Patient is a 56 year old woman who presents today in follow-up for more neuroma pain in right foot.  this began  A few months ago but states over this weekend it has gotten worse. She states that a leaf blower fell on her foot and caused bruising and swelling. Began as intermittent pain but now its so painful to weight bear the pt states it " brings tears to my eyes"  She is full weight bearing in sandals.  States the injection about 4 weeks ago really helped. However this has started to wear off. Feels repeat injection might get rid of her pain fully. Has not been in any tight shoes.    Assessment & Plan: Visit Diagnoses:  1. Morton neuroma, right     Plan: The Morton's neuroma was injected she tolerated this well. She'll follow-up in office if having continued pain in 4 weeks.   Follow-Up Instructions: Return in about 4 weeks (around 01/03/2017), or if symptoms worsen or fail to improve.   Ortho Exam Examination patient is alert oriented no adenopathy well-dressed normal affect normal respiratory effort she has good pulses. she has good ankle and subtalar motion she is exquisitely tender to palpation over the third webspace. lateral compression reproduces her pain. The metatarsal heads are nontender to palpation.  Imaging: No results found.  Orders:  No orders of the defined types were placed in this encounter.  No orders of the defined types were placed in this encounter.    Procedures: No procedures performed  Clinical Data: No additional findings.  Subjective: Review of Systems  Constitutional: Negative for chills and fever.  Musculoskeletal: Positive for gait problem and  myalgias.  Neurological: Negative for weakness and numbness.    Objective: Vital Signs: There were no vitals taken for this visit.  Specialty Comments:  No specialty comments available.  PMFS History: Patient Active Problem List   Diagnosis Date Noted  . S/P rotator cuff repair 11/04/2016  . Dyslipidemia 11/04/2016  . History of peptic ulcer disease 11/04/2016  . Interstitial cystitis 11/04/2016  . DJD (degenerative joint disease), cervical 10/29/2016  . Spondylosis of lumbar region without myelopathy or radiculopathy 10/29/2016  . Primary osteoarthritis of both knees 10/29/2016  . 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 Primary insomnia 10/29/2016  . History of recurrent cystitis 10/29/2016  . Primary osteoarthritis of both hands 10/29/2016  . Morton neuroma, right 10/15/2016  . DYSPHAGIA UNSPECIFIED 07/31/2010  . ABDOMINAL PAIN, EPIGASTRIC 07/31/2010   Past Medical History:  Diagnosis Date  . Chronic headache S/P CERVICAL FUSION'S  . Cystitis, interstitial    frequency/urgency/nocturia  . DDD (degenerative disc disease), cervical   . Dry eyes   . Frequency of urination   . Hyperlipidemia   . Mild acid reflux OCCASIONAL--  WATCHES DIET  . Nocturia   . Numbness s/p cerival fusion   right side pain and numbness   . Pulmonary nodule noted in 2010   right upper lobe -- stable (monitored by pcp)  . Status post dilation of esophageal narrowing 2011  . Urge  incontinence of urine     No family history on file.  Past Surgical History:  Procedure Laterality Date  . CERVICAL DISKECTOMY AND FUSION  05-14-2002   C5 - 7  . CYSTO WITH HYDRODISTENSION  07/19/2011   Procedure: CYSTOSCOPY/HYDRODISTENSION;  Surgeon: Malka So;  Location: [place];  Service: Urology;  Laterality: N/A;  CYSTOSCOPY WITH HYDORDISTENTION OF BLADDER AND INSTILLATION OF MARCAINE AND PYRIDIUM  . cysto/ hod  03-10-2009   [facility]  . CYSTOSCOPY N/A 05/01/2013   Procedure:  CYSTOSCOPY;  Surgeon: Reece Packer, MD;  Location: [facility];  Service: Urology;  Laterality: N/A;  . CYSTOSCOPY WITH INJECTION  06/17/2012   Procedure: CYSTOSCOPY WITH INJECTION;  Surgeon: Reece Packer, MD;  Location: [place];  Service: Urology;  Laterality: N/A;  BOTOX  . CYSTOSCOPY WITH INJECTION N/A 12/08/2012   Procedure: CYSTOSCOPY WITH BOTOX INJECTION;  Surgeon: Reece Packer, MD;  Location: [place];  Service: Urology;  Laterality: N/A;  . laparoscopy ovarian cystectomy  2000 (APPROX)  . left rotator cuff repair  OCT 2009  . POSTERIOR FUSION CERVICAL SPINE  06-29-2009   C4 - 5  . PUBOVAGINAL SLING N/A 05/01/2013   Procedure:  Lowella Dell;  Surgeon: Reece Packer, MD;  Location: [facility];  Service: Urology;  Laterality: N/A;  . ROTATOR CUFF REPAIR  2001   RIGHT SHOULDER  . TONSILLECTOMY  1981  . VAGINAL HYSTERECTOMY  1992   Social History   Occupational History  . Not on file.   Social History Main Topics  . Smoking status: Former Smoker    Types: Cigarettes    Quit date: 07/16/2006  . Smokeless tobacco: Never Used  . Alcohol use Yes     Comment: rare  . Drug use: No  . Sexual activity: Not on file

## 2017-01-03 ENCOUNTER — Telehealth: Payer: Self-pay | Admitting: Radiology

## 2017-01-03 NOTE — Telephone Encounter (Signed)
[facility] called they got a Rx for quantity of 2 for Hyalgan  Can we resend the Hyalgan for quantity of 10 syinges ? They called me then placed me on hold to speak to the pharmacy but I could not hold to speak to pharmacist today, sorry.

## 2017-01-16 NOTE — Telephone Encounter (Signed)
Needs a call. I think this one is in the folder?

## 2017-01-23 NOTE — Telephone Encounter (Signed)
IC and cannot get anyone on the line, LMVM for a pharmacist to call me back, preferably after 1pm today.  Will address the below when they call.

## 2017-01-29 NOTE — Telephone Encounter (Signed)
I have found out from patient that she no longer has [employer], only traditional [place].  I discussed with patient that she would owe 20% OOP for injections, which will be approximately $350 per knee.  She will think about this and call to schedule the Hyalgan injections buy and bill, if she wants to proceed.

## 2017-02-22 NOTE — Progress Notes (Signed)
Office Visit Note  Patient: Brenda Gray             Date of Birth: 07/17/1961           MRN: [id number]             PCP: Leanna Battles, MD Referring: Leanna Battles, MD Visit Date: 02/25/2017 Occupation: @GUAROCC @    Subjective:  Pain of the Neck and Chest Pain (woke up today with chest pain )   History of Present Illness: Brenda Gray is a 56 y.o. female  Who was last seen February 2018 for evaluation and treatment of osteoporosis. She had bone density done by her gynecologist. At the last office visit, we discussed and offered IV Reclast as a treatment option for the patient .    On 11/09/2016, patient got [facility] at [facility].  Today, patient presents today for follow-up on osteoporosis. She also states that she got a phone call from our office that her Visco supplementation has been approved through [employer] and [employer]. Unfortunately, [employer] and [employer] is no longer and affect since her husband stop working and she was on her [employer]. Patient only has Medicare insurance at this time. Therefore, she will not get the Visco supplementation at this time until she restart her husband's insurance.  She also started having chest wall pain since this morning. No associated diaphoresis, radiation of chest pain to the left jaw or the left arm or shortness of breath. She's had identical symptoms of chest wall pain in the past. She states "it hasn't happened in a long long time".   Activities of Daily Living:  Patient reports morning stiffness for 15 minutes.   Patient Denies nocturnal pain.  Difficulty dressing/grooming: Denies Difficulty climbing stairs: Denies Difficulty getting out of chair: Denies Difficulty using hands for taps, buttons, cutlery, and/or writing: Denies   Review of Systems  Constitutional: Negative for fatigue.  HENT: Negative for mouth sores and mouth dryness.   Eyes: Negative for dryness.    Respiratory: Negative for shortness of breath (no shortness of breat; able to speak in complete sentences).   Cardiovascular: Positive for chest pain (chest wall pain to left anterior rib radiating to left posterior back). Negative for palpitations and irregular heartbeat.  Gastrointestinal: Negative for constipation and diarrhea.  Musculoskeletal: Positive for muscle tenderness (left chest wall pain @ about T-8 to T-10). Negative for myalgias and myalgias.  Skin: Negative for sensitivity to sunlight.  Psychiatric/Behavioral: Negative for decreased concentration and sleep disturbance.    PMFS History:  Patient Active Problem List   Diagnosis Date Noted  . S/P rotator cuff repair 11/04/2016  . Dyslipidemia 11/04/2016  . History of peptic ulcer disease 11/04/2016  . Interstitial cystitis 11/04/2016  . DJD (degenerative joint disease), cervical 10/29/2016  . Spondylosis of lumbar region without myelopathy or radiculopathy 10/29/2016  . Primary osteoarthritis of both knees 10/29/2016  . 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 Primary insomnia 10/29/2016  . History of recurrent cystitis 10/29/2016  . Primary osteoarthritis of both hands 10/29/2016  . Morton neuroma, right 10/15/2016  . DYSPHAGIA UNSPECIFIED 07/31/2010  . ABDOMINAL PAIN, EPIGASTRIC 07/31/2010    Past Medical History:  Diagnosis Date  . Chronic headache S/P CERVICAL FUSION'S  . Cystitis, interstitial    frequency/urgency/nocturia  . DDD (degenerative disc disease), cervical   . Dry eyes   . Frequency of urination   . Hyperlipidemia   . Mild acid  reflux OCCASIONAL--  WATCHES DIET  . Nocturia   . Numbness s/p cerival fusion   right side pain and numbness   . Pulmonary nodule noted in 2010   right upper lobe -- stable (monitored by pcp)  . Status post dilation of esophageal narrowing 2011  . Urge incontinence of urine     No family history on file. Past Surgical History:  Procedure Laterality  Date  . CERVICAL DISKECTOMY AND FUSION  05-14-2002   C5 - 7  . CYSTO WITH HYDRODISTENSION  07/19/2011   Procedure: CYSTOSCOPY/HYDRODISTENSION;  Surgeon: Malka So;  Location: [place];  Service: Urology;  Laterality: N/A;  CYSTOSCOPY WITH HYDORDISTENTION OF BLADDER AND INSTILLATION OF MARCAINE AND PYRIDIUM  . cysto/ hod  03-10-2009   [facility]  . CYSTOSCOPY N/A 05/01/2013   Procedure: CYSTOSCOPY;  Surgeon: Reece Packer, MD;  Location: [facility];  Service: Urology;  Laterality: N/A;  . CYSTOSCOPY WITH INJECTION  06/17/2012   Procedure: CYSTOSCOPY WITH INJECTION;  Surgeon: Reece Packer, MD;  Location: [place];  Service: Urology;  Laterality: N/A;  BOTOX  . CYSTOSCOPY WITH INJECTION N/A 12/08/2012   Procedure: CYSTOSCOPY WITH BOTOX INJECTION;  Surgeon: Reece Packer, MD;  Location: [place];  Service: Urology;  Laterality: N/A;  . laparoscopy ovarian cystectomy  2000 (APPROX)  . left rotator cuff repair  OCT 2009  . POSTERIOR FUSION CERVICAL SPINE  06-29-2009   C4 - 5  . PUBOVAGINAL SLING N/A 05/01/2013   Procedure:  Lowella Dell;  Surgeon: Reece Packer, MD;  Location: [facility];  Service: Urology;  Laterality: N/A;  . ROTATOR CUFF REPAIR  2001   RIGHT SHOULDER  . TONSILLECTOMY  1981  . VAGINAL HYSTERECTOMY  1992   Social History   Social History Narrative  . No narrative on file     Objective: Vital Signs: BP 130/74   Pulse 62   Resp 16   Ht 5' 5"  (1.651 m)   Wt 135 lb (61.2 kg)   BMI 22.47 kg/m    Physical Exam  Constitutional: She is oriented to person, place, and time. She appears well-developed and well-nourished.  HENT:  Head: Normocephalic and atraumatic.  Eyes: EOM are normal. Pupils are equal, round, and reactive to light.  Cardiovascular: Normal rate, regular rhythm and normal heart sounds.  Exam reveals no gallop and no friction rub.   No murmur  heard. Pulmonary/Chest: Effort normal and breath sounds normal. She has no wheezes. She has no rales.  Abdominal: Soft. Bowel sounds are normal. She exhibits no distension. There is no tenderness. There is no guarding. No hernia.  Musculoskeletal: Normal range of motion. She exhibits no edema, tenderness or deformity.  Lymphadenopathy:    She has no cervical adenopathy.  Neurological: She is alert and oriented to person, place, and time. Coordination normal.  Skin: Skin is warm and dry. Capillary refill takes less than 2 seconds. No rash noted.  Psychiatric: She has a normal mood and affect. Her behavior is normal.  Nursing note and vitals reviewed.    Musculoskeletal Exam:  Full range of motion of all joints except unable to raise left arm fully secondary to pain to left chest wall when raising left arm. Grip strength is equal and strong bilaterally Fiber myalgia tender points are all absent  CDAI Exam: No CDAI exam completed.    Investigation: No additional findings. Office Visit on 11/05/2016  Component Date Value  Ref Range Status  . WBC 11/05/2016 5.7  3.8 - 10.8 K/uL Final  . RBC 11/05/2016 4.83  3.80 - 5.10 MIL/uL Final  . Hemoglobin 11/05/2016 13.4  11.7 - 15.5 g/dL Final  . HCT 11/05/2016 41.3  35.0 - 45.0 % Final  . MCV 11/05/2016 85.5  80.0 - 100.0 fL Final  . MCH 11/05/2016 27.7  27.0 - 33.0 pg Final  . MCHC 11/05/2016 32.4  32.0 - 36.0 g/dL Final  . RDW 11/05/2016 13.9  11.0 - 15.0 % Final  . Platelets 11/05/2016 237  140 - 400 K/uL Final  . MPV 11/05/2016 9.0  7.5 - 12.5 fL Final  . Neutro Abs 11/05/2016 3420  1,500 - 7,800 cells/uL Final  . Lymphs Abs 11/05/2016 1767  850 - 3,900 cells/uL Final  . Monocytes Absolute 11/05/2016 456  200 - 950 cells/uL Final  . Eosinophils Absolute 11/05/2016 57  15 - 500 cells/uL Final  . Basophils Absolute 11/05/2016 0  0 - 200 cells/uL Final  . Neutrophils Relative % 11/05/2016 60  % Final  . Lymphocytes Relative 11/05/2016 31   % Final  . Monocytes Relative 11/05/2016 8  % Final  . Eosinophils Relative 11/05/2016 1  % Final  . Basophils Relative 11/05/2016 0  % Final  . Smear Review 11/05/2016 Criteria for review not met   Final  . Sodium 11/05/2016 142  135 - 146 mmol/L Final  . Potassium 11/05/2016 4.7  3.5 - 5.3 mmol/L Final  . Chloride 11/05/2016 106  98 - 110 mmol/L Final  . CO2 11/05/2016 29  20 - 31 mmol/L Final  . Glucose, Bld 11/05/2016 77  65 - 99 mg/dL Final  . BUN 11/05/2016 18  7 - 25 mg/dL Final  . Creat 11/05/2016 0.77  0.50 - 1.05 mg/dL Final   Comment:   For patients > or = 58 years of age: The upper reference limit for Creatinine is approximately 13% higher for people identified as African-American.     . Total Bilirubin 11/05/2016 0.5  0.2 - 1.2 mg/dL Final  . Alkaline Phosphatase 11/05/2016 65  33 - 130 U/L Final  . AST 11/05/2016 21  10 - 35 U/L Final  . ALT 11/05/2016 17  6 - 29 U/L Final  . Total Protein 11/05/2016 6.5  6.1 - 8.1 g/dL Final  . Albumin 11/05/2016 4.3  3.6 - 5.1 g/dL Final  . Calcium 11/05/2016 9.7  8.6 - 10.4 mg/dL Final  . GFR, Est African American 11/05/2016 >89  >=60 mL/min Final  . GFR, Est Non African American 11/05/2016 85  >=60 mL/min Final  . Total Protein, Serum Electrophores* 11/05/2016 6.5  6.1 - 8.1 g/dL Final  . Albumin ELP 11/05/2016 4.2  3.8 - 4.8 g/dL Final  . Alpha-1-Globulin 11/05/2016 0.3  0.2 - 0.3 g/dL Final  . Alpha-2-Globulin 11/05/2016 0.7  0.5 - 0.9 g/dL Final  . Beta Globulin 11/05/2016 0.4  0.4 - 0.6 g/dL Final  . Beta 2 11/05/2016 0.3  0.2 - 0.5 g/dL Final  . Gamma Globulin 11/05/2016 0.6* 0.8 - 1.7 g/dL Final  . Abnormal Protein Band1 11/05/2016 NOT DET  g/dL Final  . SPE Interp. 11/05/2016 SEE NOTE   Final   Comment: One or more serum protein fractions are outside the normal ranges. No abnormal protein bands are apparent. Reviewed by Odis Hollingshead, MD, PhD, FCAP (Electronic Signature on File)   . Abnormal Protein Band2  11/05/2016 NOT DET  g/dL Final  . Abnormal  Protein Band3 11/05/2016 NOT DET  g/dL Final  . Vit D, 25-Hydroxy 11/05/2016 41  30 - 100 ng/mL Final   Comment: Vitamin D Status           25-OH Vitamin D        Deficiency                <20 ng/mL        Insufficiency         20 - 29 ng/mL        Optimal             > or = 30 ng/mL   For 25-OH Vitamin D testing on patients on D2-supplementation and patients for whom quantitation of D2 and D3 fractions is required, the QuestAssureD 25-OH VIT D, (D2,D3), LC/MS/MS is recommended: order code [id number] (patients > 2 yrs).   . Magnesium 11/05/2016 1.9  1.5 - 2.5 mg/dL Final  . PTH 11/05/2016 20  14 - 64 pg/mL Final   Comment:   Interpretive Guide:                              Intact PTH               Calcium                              ----------               ------- Normal Parathyroid           Normal                   Normal Hypoparathyroidism           Low or Low Normal        Low Hyperparathyroidism      Primary                 Normal or High           High      Secondary               High                     Normal or Low      Tertiary                High                     High Non-Parathyroid   Hypercalcemia              Low or Low Normal        High      Imaging: No results found.  Speciality Comments: No specialty comments available.    Procedures:  No procedures performed Allergies: Cymbalta [duloxetine hcl] and Lyrica [pregabalin]   Assessment / Plan:     Visit Diagnoses: Age-related osteoporosis without current pathological fracture  Primary osteoarthritis of both knees  DJD (degenerative joint disease), cervical  DDD (degenerative disc disease), thoracic  History of peptic ulcer disease  Primary osteoarthritis of both hands    Plan   #1 Osteoporosis. T score of -3.0. Bone density done by her GYN. She was started with IV Reclast as of 11/09/2016.  Creatinine was normal Alkaline Phosphatase 11/05/2016  65  33 - 130 U/L  AST 11/05/2016  21  10 - 35 U/L  ALT 11/05/2016 17  6 - 29 U/L  Total Protein 11/05/2016 6.5  6.1 - 8.1 g/dL  Albumin 11/05/2016 4.3  3.6 - 5.1 g/dL  Calcium 11/05/2016 9.7  8.6 - 10.4 mg/dL  GFR, Est African American 11/05/2016 >89  >=60 mL/min  GFR, Est Non African American 11/05/2016 85  >=60 mL/min  Total Protein, Serum Electrophores* 11/05/2016 6.5  6.1 - 8.1 g/dL  Albumin ELP 11/05/2016 4.2  3.8 - 4.8 g/dL  Alpha-1-Globulin 11/05/2016 0.3  0.2 - 0.3 g/dL  Alpha-2-Globulin 11/05/2016 0.7  0.5 - 0.9 g/dL  Beta Globulin 11/05/2016 0.4  0.4 - 0.6 g/dL  Beta 2 11/05/2016 0.3  0.2 - 0.5 g/dL  Gamma Globulin 11/05/2016 0.6* 0.8 - 1.7 g/dL  Abnormal Protein Band1 11/05/2016 NOT DET  g/dL  SPE Interp. 11/05/2016 SEE NOTE    Comment: One or more serum protein fractions are outside the normal ranges. No abnormal protein bands are apparent. Reviewed by Odis Hollingshead, MD, PhD, FCAP (Electronic Signature on File)   Abnormal Protein Band2 11/05/2016 NOT DET  g/dL  Abnormal Protein Band3 11/05/2016 NOT DET  g/dL  Vit D, 25-Hydroxy 11/05/2016 41  30 - 100 ng/mL  Comment: Vitamin D Status           25-OH Vitamin D        Deficiency                <20 ng/mL        Insufficiency         20 - 29 ng/mL        Optimal             > or = 30 ng/mL   For 25-OH Vitamin D testing on patients on D2-supplementation and patients for whom quantitation of D2 and D3 fractions is required, the QuestAssureD 25-OH VIT D, (D2,D3), LC/MS/MS is recommended: order code [id number] (patients > 2 yrs).   Magnesium 11/05/2016 1.9  1.5 - 2.5 mg/dL  PTH 11/05/2016 20  14 - 64 pg/mL    #2: DDD of the C-spine, T-spine Ongoing stiffness to C-spine  #3: Spondylosis of lumbar region with no myelopathy or radiculopathy. Ongoing pain.  #4: OA of bilateral hands. Occasional pain to the hands.  #5: Acute chest wall pain that began this morning when patient woke up. Occurring below the left breast  anteriorly and radiating posteriorly to the inferior scapular area. Able to palpate area and recreate chest wall pain.  Pt states she had identical pain in the past. She says "it is not a heart atack".  "Dr. Ernestina Patches has given me injections in the past and those injections have helped me with these pains".  Note:  No shortness of breath (pt able to speak in complete sentences); no diaphoresis; no radiation of chest pain to left shoulder joint or left jaw.  Pt advised to go to ER if sxs worsen or don't resolve.    Orders: No orders of the defined types were placed in this encounter.  Meds ordered this encounter  Medications  . tiZANidine (ZANAFLEX) 4 MG tablet    Sig: Take ONE Tablet PO QHS as needed    Dispense:  30 tablet    Refill:  3    Order Specific Question:   Supervising Provider    Answer:   Lyda Perone    Face-to-face time spent with patient was 30 minutes. 50% of time was spent in counseling  and coordination of care.  Follow-Up Instructions: Return in about 6 months (around 08/27/2017) for OSTEOPROSIS, IV RECLAST, OA KJ, OA HANDS.   Eliezer Lofts, PA-C    I examined and evaluated the patient with Eliezer Lofts PA. Patient denies any history of palpitation or shortness of breath. She had reproducible pain on palpation. The pain appears to be musculoskeletal in origin. She supposed to notify us if her symptoms persist. The plan of care was discussed as noted above.  Bo Merino, MD Note - This record has been created using Editor, commissioning.  Chart creation errors have been sought, but may not always  have been located. Such creation errors do not reflect on  the standard of medical care.

## 2017-02-25 ENCOUNTER — Ambulatory Visit (INDEPENDENT_AMBULATORY_CARE_PROVIDER_SITE_OTHER): Payer: Medicare Other | Admitting: Rheumatology

## 2017-02-25 ENCOUNTER — Encounter: Payer: Self-pay | Admitting: Rheumatology

## 2017-02-25 VITALS — BP 130/74 | HR 62 | Resp 16 | Ht 65.0 in | Wt 135.0 lb

## 2017-02-25 DIAGNOSIS — M5030 Other cervical disc degeneration, unspecified cervical region: Secondary | ICD-10-CM

## 2017-02-25 DIAGNOSIS — M810 Age-related osteoporosis without current pathological fracture: Secondary | ICD-10-CM | POA: Diagnosis not present

## 2017-02-25 DIAGNOSIS — M5134 Other intervertebral disc degeneration, thoracic region: Secondary | ICD-10-CM | POA: Diagnosis not present

## 2017-02-25 DIAGNOSIS — M19041 Primary osteoarthritis, right hand: Secondary | ICD-10-CM

## 2017-02-25 DIAGNOSIS — M47812 Spondylosis without myelopathy or radiculopathy, cervical region: Secondary | ICD-10-CM

## 2017-02-25 DIAGNOSIS — M170 Bilateral primary osteoarthritis of knee: Secondary | ICD-10-CM | POA: Diagnosis not present

## 2017-02-25 DIAGNOSIS — M19042 Primary osteoarthritis, left hand: Secondary | ICD-10-CM

## 2017-02-25 DIAGNOSIS — Z8711 Personal history of peptic ulcer disease: Secondary | ICD-10-CM

## 2017-02-25 MED ORDER — TIZANIDINE HCL 4 MG PO TABS: ORAL_TABLET | ORAL | 3 refills | Status: DC

## 2017-02-28 DIAGNOSIS — H52203 Unspecified astigmatism, bilateral: Secondary | ICD-10-CM | POA: Diagnosis not present

## 2017-02-28 DIAGNOSIS — H5213 Myopia, bilateral: Secondary | ICD-10-CM | POA: Diagnosis not present

## 2017-02-28 DIAGNOSIS — H2513 Age-related nuclear cataract, bilateral: Secondary | ICD-10-CM | POA: Diagnosis not present

## 2017-02-28 DIAGNOSIS — H524 Presbyopia: Secondary | ICD-10-CM | POA: Diagnosis not present

## 2017-03-15 DIAGNOSIS — Z0000 Encounter for general adult medical examination without abnormal findings: Secondary | ICD-10-CM | POA: Diagnosis not present

## 2017-03-15 DIAGNOSIS — E784 Other hyperlipidemia: Secondary | ICD-10-CM | POA: Diagnosis not present

## 2017-03-28 DIAGNOSIS — Z1212 Encounter for screening for malignant neoplasm of rectum: Secondary | ICD-10-CM | POA: Diagnosis not present

## 2017-03-28 DIAGNOSIS — E784 Other hyperlipidemia: Secondary | ICD-10-CM | POA: Diagnosis not present

## 2017-03-28 DIAGNOSIS — M5030 Other cervical disc degeneration, unspecified cervical region: Secondary | ICD-10-CM | POA: Diagnosis not present

## 2017-03-28 DIAGNOSIS — Z1389 Encounter for screening for other disorder: Secondary | ICD-10-CM | POA: Diagnosis not present

## 2017-03-28 DIAGNOSIS — N200 Calculus of kidney: Secondary | ICD-10-CM | POA: Diagnosis not present

## 2017-03-28 DIAGNOSIS — Z0000 Encounter for general adult medical examination without abnormal findings: Secondary | ICD-10-CM | POA: Diagnosis not present

## 2017-03-28 DIAGNOSIS — R0789 Other chest pain: Secondary | ICD-10-CM | POA: Diagnosis not present

## 2017-03-28 DIAGNOSIS — G4709 Other insomnia: Secondary | ICD-10-CM | POA: Diagnosis not present

## 2017-03-28 DIAGNOSIS — M5136 Other intervertebral disc degeneration, lumbar region: Secondary | ICD-10-CM | POA: Diagnosis not present

## 2017-05-06 ENCOUNTER — Telehealth: Payer: Self-pay | Admitting: Cardiovascular Disease

## 2017-05-06 NOTE — Telephone Encounter (Signed)
Received records from [facility] for appointment on 05/31/17 with Dr Oval Linsey.  Records put with Dr Blenda Mounts schedule for 05/31/17. lp

## 2017-05-31 ENCOUNTER — Encounter: Payer: Self-pay | Admitting: Cardiovascular Disease

## 2017-05-31 ENCOUNTER — Ambulatory Visit (INDEPENDENT_AMBULATORY_CARE_PROVIDER_SITE_OTHER): Payer: Medicare Other | Admitting: Cardiovascular Disease

## 2017-05-31 VITALS — BP 124/78 | HR 58 | Ht 65.0 in | Wt 134.4 lb

## 2017-05-31 DIAGNOSIS — R0789 Other chest pain: Secondary | ICD-10-CM

## 2017-05-31 DIAGNOSIS — Z8249 Family history of ischemic heart disease and other diseases of the circulatory system: Secondary | ICD-10-CM

## 2017-05-31 DIAGNOSIS — R079 Chest pain, unspecified: Secondary | ICD-10-CM

## 2017-05-31 DIAGNOSIS — R609 Edema, unspecified: Secondary | ICD-10-CM

## 2017-05-31 LAB — BASIC METABOLIC PANEL
BUN/Creatinine Ratio: 18 (ref 9–23)
BUN: 14 mg/dL (ref 6–24)
CO2: 25 mmol/L (ref 20–29)
CREATININE: 0.77 mg/dL (ref 0.57–1.00)
Calcium: 10 mg/dL (ref 8.7–10.2)
Chloride: 103 mmol/L (ref 96–106)
GFR, EST AFRICAN AMERICAN: 98 mL/min/{1.73_m2} (ref >59)
GFR, EST NON AFRICAN AMERICAN: 85 mL/min/{1.73_m2} (ref >59)
Glucose: 81 mg/dL (ref 65–99)
POTASSIUM: 4.2 mmol/L (ref 3.5–5.2)
Sodium: 144 mmol/L (ref 134–144)

## 2017-05-31 NOTE — Patient Instructions (Addendum)
Medication Instructions:  Your physician recommends that you continue on your current medications as directed. Please refer to the Current Medication list given to you today.  Labwork: BMET TODAY   Testing/Procedures: CORONARY CTA  Your physician has requested that you have an echocardiogram. Echocardiography is a painless test that uses sound waves to create images of your heart. It provides your doctor with information about the size and shape of your heart and how well your heart's chambers and valves are working. This procedure takes approximately one hour. There are no restrictions for this procedure. [place] [address]  Follow-Up: Your physician recommends that you schedule a follow-up appointment in: [place]  If you need a refill on your cardiac medications before your next appointment, please call your pharmacy.   Cardiac CT Angiogram A cardiac CT angiogram is a procedure to look at the heart and the area around the heart. It may be done to help find the cause of chest pains or other symptoms of heart disease. During this procedure, a large X-ray machine, called a CT scanner, takes detailed pictures of the heart and the surrounding area after a dye (contrast material) has been injected into blood vessels in the area. The procedure is also sometimes called a coronary CT angiogram, coronary artery scanning, or CTA. A cardiac CT angiogram allows the health care provider to see how well blood is flowing to and from the heart. The health care provider will be able to see if there are any problems, such as:  Blockage or narrowing of the coronary arteries in the heart.  Fluid around the heart.  Signs of weakness or disease in the muscles, valves, and tissues of the heart.  Tell a health care provider about:  Any allergies you have. This is especially important if you have had a previous allergic reaction to contrast dye.  All medicines you are  taking, including vitamins, herbs, eye drops, creams, and over-the-counter medicines.  Any blood disorders you have.  Any surgeries you have had.  Any medical conditions you have.  Whether you are pregnant or may be pregnant.  Any anxiety disorders, chronic pain, or other conditions you have that may increase your stress or prevent you from lying still. What are the risks? Generally, this is a safe procedure. However, problems may occur, including:  Bleeding.  Infection.  Allergic reactions to medicines or dyes.  Damage to other structures or organs.  Kidney damage from the dye or contrast that is used.  Increased risk of cancer from radiation exposure. This risk is low. Talk with your health care provider about: ? The risks and benefits of testing. ? How you can receive the lowest dose of radiation.  What happens before the procedure?  Wear comfortable clothing and remove any jewelry, glasses, dentures, and hearing aids.  Follow instructions from your health care provider about eating and drinking. This may include: ? For 12 hours before the test - avoid caffeine. This includes tea, coffee, soda, energy drinks, and diet pills. Drink plenty of water or other fluids that do not have caffeine in them. Being well-hydrated can prevent complications. ? For 4-6 hours before the test - stop eating and drinking. The contrast dye can cause nausea, but this is less likely if your stomach is empty.  Ask your health care provider about changing or stopping your regular medicines. This is especially important if you are taking diabetes medicines, blood thinners, or medicines to  treat erectile dysfunction. What happens during the procedure?  Hair on your chest may need to be removed so that small sticky patches called electrodes can be placed on your chest. These will transmit information that helps to monitor your heart during the test.  An IV tube will be inserted into one of your  veins.  You might be given a medicine to control your heart rate during the test. This will help to ensure that good images are obtained.  You will be asked to lie on an exam table. This table will slide in and out of the CT machine during the procedure.  Contrast dye will be injected into the IV tube. You might feel warm, or you may get a metallic taste in your mouth.  You will be given a medicine (nitroglycerin) to relax (dilate) the arteries in your heart.  The table that you are lying on will move into the CT machine tunnel for the scan.  The person running the machine will give you instructions while the scans are being done. You may be asked to: ? Keep your arms above your head. ? Hold your breath. ? Stay very still, even if the table is moving.  When the scanning is complete, you will be moved out of the machine.  The IV tube will be removed. The procedure may vary among health care providers and hospitals. What happens after the procedure?  You might feel warm, or you may get a metallic taste in your mouth from the contrast dye.  You may have a headache from the nitroglycerin.  After the procedure, drink water or other fluids to wash (flush) the contrast material out of your body.  Contact a health care provider if you have any symptoms of allergy to the contrast. These symptoms include: ? Shortness of breath. ? Rash or hives. ? A racing heartbeat.  Most people can return to their normal activities right after the procedure. Ask your health care provider what activities are safe for you.  It is up to you to get the results of your procedure. Ask your health care provider, or the department that is doing the procedure, when your results will be ready. Summary  A cardiac CT angiogram is a procedure to look at the heart and the area around the heart. It may be done to help find the cause of chest pains or other symptoms of heart disease.  During this procedure, a large  X-ray machine, called a CT scanner, takes detailed pictures of the heart and the surrounding area after a dye (contrast material) has been injected into blood vessels in the area.  Ask your health care provider about changing or stopping your regular medicines before the procedure. This is especially important if you are taking diabetes medicines, blood thinners, or medicines to treat erectile dysfunction.  After the procedure, drink water or other fluids to wash (flush) the contrast material out of your body. This information is not intended to replace advice given to you by your health care provider. Make sure you discuss any questions you have with your health care provider. Document Released: 08/09/2008 Document Revised: 07/16/2016 Document Reviewed: 07/16/2016 Elsevier Interactive Patient Education  2017 [employer].

## 2017-05-31 NOTE — Progress Notes (Signed)
Cardiology Office Note   Date:  06/04/2017   ID:  Brenda Gray, DOB 05/20/1961, MRN [id number]  PCP:  Leanna Battles, MD  Cardiologist:   Skeet Latch, MD   Chief Complaint  Patient presents with  . New Patient (Initial Visit)      History of Present Illness: Brenda Gray is a 56 y.o. female with hyperlipidemia who presents for an evaluation of chest pain.  She had a severe episode of chest pain 02/2017 that felt like something was crushing her chest and lungs.  That day the pain was so severe that she was unabe to move.  It occurred at rest.  There was no associated shortness of breath but she did report pleuritic chest pain.  If feels like pressure on the L side of her chest.  The episodes last for a few seconds and are not associated with nausea.  She reported these symptoms to Dr.  Leanna Battles 03/2017 and was referred to cardiology for further evaluation.  Ms. Sicard has a history of back and neck spasms since her spinal surgery.  She typically get spinal injection but has not gotten any lately due to a lapse in her insurance. Her last injection was 04/2016. She typically gets them every 6-8 months. She thought that these symptoms may be related.  She Is very active and [facility] the [facility] center 4 [facility] per [facility]. She has no exertional symptoms. She notes increased diaphoresis with exertion but no chest pain or shortness of breath.  She notes increased diaphoresis with exertion but no chest pain or shortness of breath.  She has noted some mild edema in her feet that improves with elevation.  She denies orthopnea or PND.  [name] reports that her blood pressure has always been well-controlled. In the past that has been quite low , but more recently it has been in the upper limit of normal.  Ms. Deasis's brother had a heart attack at age 68.   Past Medical History:  Diagnosis Date  . Atypical chest pain 06/04/2017  . Chronic headache S/P CERVICAL  FUSION'S  . Cystitis, interstitial    frequency/urgency/nocturia  . DDD (degenerative disc disease), cervical   . Dry eyes   . Frequency of urination   . Hyperlipidemia   . Mild acid reflux OCCASIONAL--  WATCHES DIET  . Nocturia   . Numbness s/p cerival fusion   right side pain and numbness   . Pulmonary nodule noted in 2010   right upper lobe -- stable (monitored by pcp)  . Status post dilation of esophageal narrowing 2011  . Urge incontinence of urine     Past Surgical History:  Procedure Laterality Date  . CERVICAL DISKECTOMY AND FUSION  05-14-2002   C5 - 7  . CYSTO WITH HYDRODISTENSION  07/19/2011   Procedure: CYSTOSCOPY/HYDRODISTENSION;  Surgeon: Malka So;  Location: [place];  Service: Urology;  Laterality: N/A;  CYSTOSCOPY WITH HYDORDISTENTION OF BLADDER AND INSTILLATION OF MARCAINE AND PYRIDIUM  . cysto/ hod  03-10-2009   [facility]  . CYSTOSCOPY N/A 05/01/2013   Procedure: CYSTOSCOPY;  Surgeon: Reece Packer, MD;  Location: [facility];  Service: Urology;  Laterality: N/A;  . CYSTOSCOPY WITH INJECTION  06/17/2012   Procedure: CYSTOSCOPY WITH INJECTION;  Surgeon: Reece Packer, MD;  Location: [place];  Service: Urology;  Laterality: N/A;  BOTOX  . CYSTOSCOPY WITH INJECTION N/A 12/08/2012   Procedure: CYSTOSCOPY WITH BOTOX INJECTION;  Surgeon: Reece Packer, MD;  Location: [facility];  Service: Urology;  Laterality: N/A;  . laparoscopy ovarian cystectomy  2000 (APPROX)  . left rotator cuff repair  OCT 2009  . POSTERIOR FUSION CERVICAL SPINE  06-29-2009   C4 - 5  . PUBOVAGINAL SLING N/A 05/01/2013   Procedure:  [place];  Surgeon: Reece Packer, MD;  Location: [facility];  Service: Urology;  Laterality: N/A;  . ROTATOR CUFF REPAIR  2001   RIGHT SHOULDER  . TONSILLECTOMY  1981  . VAGINAL HYSTERECTOMY  1992     Current Outpatient Prescriptions  Medication Sig Dispense  Refill  . ALPRAZolam (XANAX) 0.5 MG tablet alprazolam 0.5 mg tablet    . Calcium Carbonate-Vitamin D (CALCIUM 600 + D PO) Take 1 tablet by mouth 2 (two) times daily.    . Cetirizine HCl (ZYRTEC ALLERGY PO) Take by mouth.    . Ginger, Zingiber officinalis, (GINGER PO) Take by mouth.    Marland Kitchen HYDROcodone-acetaminophen (NORCO) 5-325 MG per tablet Take 1 tablet by mouth every 6 (six) hours as needed for pain. 40 tablet 0  . Multiple Vitamin (MULTIVITAMIN) tablet Take 1 tablet by mouth daily.     . Omega-3 Fatty Acids (FISH OIL) 1000 MG CAPS Take by mouth.    . pravastatin (PRAVACHOL) 20 MG tablet Take 20 mg by mouth every evening.     . Probiotic Product (PROBIOTIC DAILY PO) Take 1 tablet by mouth daily.     Marland Kitchen tiZANidine (ZANAFLEX) 4 MG tablet Take ONE Tablet PO QHS as needed 30 tablet 3  . traMADol (ULTRAM) 50 MG tablet Take 50 mg by mouth every 6 (six) hours as needed for pain.    . TURMERIC PO Take by mouth.     Current Facility-Administered Medications  Medication Dose Route Frequency Provider Last Rate Last Dose  . zoledronic acid (RECLAST) injection 5 mg  5 mg Intravenous Once Bo Merino, MD        Allergies:   Cymbalta [duloxetine hcl] and Lyrica [pregabalin]    Social History:  The patient  reports that she quit smoking about 10 years ago. Her smoking use included Cigarettes. She has never used smokeless tobacco. She reports that she drinks alcohol. She reports that she does not use drugs.   Family History:  The patient's family history includes Heart disease in her brother and brother; Heart failure in her mother; Hypertension in her brother and mother; Stroke in her maternal grandmother.    ROS:  Please see the history of present illness.   Otherwise, review of systems are positive for none.   All other systems are reviewed and negative.    PHYSICAL EXAM: VS:  BP 124/78   Pulse (!) 58   Ht 5\' 5"  (1.651 m)   Wt 61 kg (134 lb 6.4 oz)   BMI 22.37 kg/m  , BMI Body mass index  is 22.37 kg/m. GENERAL:  Well appearing HEENT:  Pupils equal round and reactive, fundi not visualized, oral mucosa unremarkable NECK:  No jugular venous distention, waveform within normal limits, carotid upstroke brisk and symmetric, no bruits, no thyromegaly LYMPHATICS:  No cervical adenopathy LUNGS:  Clear to auscultation bilaterally HEART:  RRR.  PMI not displaced or sustained,S1 and S2 within normal limits, no S3, no S4, no clicks, no rubs, no murmurs ABD:  Flat, positive bowel sounds normal in frequency in pitch, no bruits, no rebound, no guarding, no midline pulsatile mass, no hepatomegaly, no splenomegaly  EXT:  2 plus pulses throughout, no edema, no cyanosis no clubbing SKIN:  No rashes no nodules NEURO:  Cranial nerves II through XII grossly intact, motor grossly intact throughout PSYCH:  Cognitively intact, oriented to person place and time    EKG:  EKG is ordered today. The ekg ordered today demonstrates sinus bradycardia.  Rate 58 bpm.    Recent Labs: 11/05/2016: ALT 17; Hemoglobin 13.4; Magnesium 1.9; Platelets 237 05/31/2017: BUN 14; Creatinine, Ser 0.77; Potassium 4.2; Sodium 144   03/15/17:   Sodium 144, potassium 5.1, BUN 17, creatinine 0.8 WBC 5.3, hemoglobin 14, hematocrit 42.6, platelets 212 Total cholesterol 207, triglycerides 45, HDL 65, LDL 133  Lipid Panel No results found for: CHOL, TRIG, HDL, CHOLHDL, VLDL, LDLCALC, LDLDIRECT    Wt Readings from Last 3 Encounters:  05/31/17 61 kg (134 lb 6.4 oz)  02/25/17 61.2 kg (135 lb)  11/09/16 60.8 kg (134 lb)      ASSESSMENT AND PLAN:  # Atypical chest pain: Symptoms are atypical and seem more attributable to her spinal disease and spasms than ischemia. She has no exertional symptoms. Given her family history we will get a CT-A to assess for coronary artery disease.  # Hyperlipidemia: Ms. Tantillo is on pravastatin. Her most recent LDL was 133 on 03/2017. We'll make a decision about the need to increase her  statin after her CT-A.  # LE Edema: No edema on exam today.  We will get an echo to assess.  Current medicines are reviewed at length with the patient today.  The patient does not have concerns regarding medicines.  The following changes have been made:  no change  Labs/ tests ordered today include:   Orders Placed This Encounter  Procedures  . CT CORONARY MORPH W/CTA COR W/SCORE W/CA W/CM &/OR WO/CM  . CT CORONARY FRACTIONAL FLOW RESERVE DATA PREP  . CT CORONARY FRACTIONAL FLOW RESERVE FLUID ANALYSIS  . Basic metabolic panel  . EKG 12-Lead  . ECHOCARDIOGRAM COMPLETE     Disposition:   FU with Jailani Hogans C. Oval Linsey, MD, [facility] in 1 month.   This note was written with the assistance of speech recognition software.  Please excuse any transcriptional errors.  Signed, Daxten Kovalenko C. Oval Linsey, MD, [facility]  06/04/2017 6:19 PM    [place]

## 2017-06-04 ENCOUNTER — Encounter: Payer: Self-pay | Admitting: Cardiovascular Disease

## 2017-06-04 ENCOUNTER — Telehealth: Payer: Self-pay | Admitting: Cardiovascular Disease

## 2017-06-04 DIAGNOSIS — R0789 Other chest pain: Secondary | ICD-10-CM | POA: Insufficient documentation

## 2017-06-04 HISTORY — DX: Other chest pain: R07.89

## 2017-06-04 NOTE — Telephone Encounter (Signed)
Spoke with Amparo [facility] and she tried to call patient regarding cardiac CTA, she is aware and will call the patient

## 2017-06-04 NOTE — Telephone Encounter (Signed)
New message   Pt calling someone back

## 2017-06-10 NOTE — Addendum Note (Signed)
Addended by: Alvina Filbert B on: 06/10/2017 04:01 PM   Modules accepted: Orders

## 2017-06-13 ENCOUNTER — Ambulatory Visit (HOSPITAL_COMMUNITY)
Admission: RE | Admit: 2017-06-13 | Discharge: 2017-06-13 | Disposition: A | Discharge status: Home / Self Care | Payer: Medicare Other | Source: Ambulatory Visit | Attending: Cardiovascular Disease | Admitting: Cardiovascular Disease

## 2017-06-13 DIAGNOSIS — R079 Chest pain, unspecified: Secondary | ICD-10-CM

## 2017-06-13 DIAGNOSIS — I700 Atherosclerosis of aorta: Secondary | ICD-10-CM | POA: Diagnosis not present

## 2017-06-13 DIAGNOSIS — R609 Edema, unspecified: Secondary | ICD-10-CM

## 2017-06-13 DIAGNOSIS — Z8249 Family history of ischemic heart disease and other diseases of the circulatory system: Secondary | ICD-10-CM | POA: Insufficient documentation

## 2017-06-13 DIAGNOSIS — R0789 Other chest pain: Secondary | ICD-10-CM | POA: Diagnosis not present

## 2017-06-13 MED ORDER — METOPROLOL TARTRATE 5 MG/5ML IV SOLN
5.0000 mg | Freq: Once | INTRAVENOUS | Status: AC
  Administered 2017-06-13 (×2): 5 mg via INTRAVENOUS

## 2017-06-13 MED ORDER — NITROGLYCERIN 0.4 MG SL SUBL
0.8000 mg | SUBLINGUAL_TABLET | SUBLINGUAL | Status: DC | PRN
  Administered 2017-06-13: 0.8 mg via SUBLINGUAL

## 2017-06-13 MED ORDER — METOPROLOL TARTRATE 5 MG/5ML IV SOLN
INTRAVENOUS | Status: AC
  Filled 2017-06-13: qty 10

## 2017-06-13 MED ORDER — IOPAMIDOL (ISOVUE-370) INJECTION 76%
INTRAVENOUS | Status: AC
  Administered 2017-06-13: 80 mL via INTRAVENOUS
  Filled 2017-06-13: qty 100

## 2017-06-13 MED ORDER — NITROGLYCERIN 0.4 MG SL SUBL
SUBLINGUAL_TABLET | SUBLINGUAL | Status: AC
  Filled 2017-06-13: qty 1

## 2017-06-17 DIAGNOSIS — Z23 Encounter for immunization: Secondary | ICD-10-CM | POA: Diagnosis not present

## 2017-06-18 ENCOUNTER — Ambulatory Visit (HOSPITAL_COMMUNITY): Payer: Medicare Other | Attending: Internal Medicine

## 2017-06-18 ENCOUNTER — Other Ambulatory Visit: Payer: Self-pay

## 2017-06-18 DIAGNOSIS — R079 Chest pain, unspecified: Secondary | ICD-10-CM | POA: Insufficient documentation

## 2017-06-18 DIAGNOSIS — R609 Edema, unspecified: Secondary | ICD-10-CM | POA: Diagnosis not present

## 2017-06-18 DIAGNOSIS — I361 Nonrheumatic tricuspid (valve) insufficiency: Secondary | ICD-10-CM | POA: Insufficient documentation

## 2017-06-26 DIAGNOSIS — H2513 Age-related nuclear cataract, bilateral: Secondary | ICD-10-CM | POA: Diagnosis not present

## 2017-07-02 ENCOUNTER — Ambulatory Visit: Payer: Medicare Other | Admitting: Cardiology

## 2017-07-10 DIAGNOSIS — H2513 Age-related nuclear cataract, bilateral: Secondary | ICD-10-CM | POA: Diagnosis not present

## 2017-07-11 HISTORY — PX: CATARACT EXTRACTION, BILATERAL: SHX1313

## 2017-07-19 DIAGNOSIS — Z6832 Body mass index (BMI) 32.0-32.9, adult: Secondary | ICD-10-CM | POA: Diagnosis not present

## 2017-07-19 DIAGNOSIS — K1120 Sialoadenitis, unspecified: Secondary | ICD-10-CM | POA: Diagnosis not present

## 2017-07-24 DIAGNOSIS — H2512 Age-related nuclear cataract, left eye: Secondary | ICD-10-CM | POA: Diagnosis not present

## 2017-07-31 DIAGNOSIS — H2511 Age-related nuclear cataract, right eye: Secondary | ICD-10-CM | POA: Diagnosis not present

## 2017-08-15 ENCOUNTER — Telehealth: Payer: Self-pay

## 2017-08-15 NOTE — Telephone Encounter (Signed)
Patient returned call. She states that she now only has Medicare health insurance. She does not have an Rx plan or a supplemental plan. Medicare will cover 80% of the infusion service and patient will be responsible for 20% as well as the $183 deductible. Patient is aware and was informed that she would be required to pay around $700 for the once a year service. No benefit verification will be required at this time.   Quandre Polinski, [place], CPhT 11:37 AM

## 2017-08-15 NOTE — Telephone Encounter (Signed)
Called the patient to verify benefits for 2019. Pt currently received infusions that may require a pre-certification.   Left message for patient to call back.   Eaden Hettinger, [place], CPhT 11:12 AM

## 2017-08-19 NOTE — Progress Notes (Signed)
Office Visit Note  Patient: Brenda Gray             Date of Birth: [birth year]           MRN: [id number]             PCP: Leanna Battles, MD Referring: Leanna Battles, MD Visit Date: 08/29/2017 Occupation: @GUAROCC @    Subjective:  Left shoulder pain.   History of Present Illness: Brenda Gray is a 56 y.o. female with history of osteoporosis, osteoarthritis and degenerative disc disease. She had her Reclast infusion in February 2018. She will be getting her next Reclast infusion in February 2019. She tolerated the infusion well. She's been taking calcium and vitamin D on regular basis. She continues to have some discomfort in her cervical thoracic and lumbar spine. She did quite well after the shoulder joint rotator cuff tear repair. recently she's been having discomfort in her left shoulder and difficulty raising her left arm. He is having nocturnal pain when she sleeps on the left side. She's been experiencing a stiffness in her hands. Her knee joint discomfort is tolerable.  Activities of Daily Living:  Patient reports morning stiffness for 0 minutes.   Patient Reports nocturnal pain.  Difficulty dressing/grooming: Reports Difficulty climbing stairs: Denies Difficulty getting out of chair: Denies Difficulty using hands for taps, buttons, cutlery, and/or writing: Reports   Review of Systems  Constitutional: Negative.  Negative for fatigue, night sweats, weight gain, weight loss and weakness.  HENT: Negative for mouth sores, trouble swallowing, trouble swallowing, mouth dryness and nose dryness.   Eyes: Negative for pain, redness, visual disturbance and dryness.  Respiratory: Negative.  Negative for cough, shortness of breath and difficulty breathing.   Cardiovascular: Negative.  Negative for chest pain, palpitations, hypertension, irregular heartbeat and swelling in legs/feet.  Gastrointestinal: Negative.  Negative for blood in stool, constipation and diarrhea.    Endocrine: Negative.  Negative for increased urination.  Genitourinary: Negative.  Negative for nocturia and vaginal dryness.  Musculoskeletal: Positive for arthralgias and joint pain. Negative for joint swelling, myalgias, muscle weakness, morning stiffness, muscle tenderness and myalgias.  Skin: Negative.  Negative for color change, rash, hair loss, skin tightness, ulcers and sensitivity to sunlight.  Allergic/Immunologic: Negative for susceptible to infections.  Neurological: Negative.  Negative for dizziness, headaches, memory loss and night sweats.  Hematological: Negative.  Negative for swollen glands.  Psychiatric/Behavioral: Negative.  Negative for depressed mood and sleep disturbance. The patient is not nervous/anxious.     PMFS History:  Patient Active Problem List   Diagnosis Date Noted  . Atypical chest pain 06/04/2017  . S/P rotator cuff repair 11/04/2016  . Dyslipidemia 11/04/2016  . History of peptic ulcer disease 11/04/2016  . Interstitial cystitis 11/04/2016  . DJD (degenerative joint disease), cervical 10/29/2016  . Spondylosis of lumbar region without myelopathy or radiculopathy 10/29/2016  . Primary osteoarthritis of both knees 10/29/2016  . 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 Primary insomnia 10/29/2016  . History of recurrent cystitis 10/29/2016  . Primary osteoarthritis of both hands 10/29/2016  . Morton neuroma, right 10/15/2016  . DYSPHAGIA UNSPECIFIED 07/31/2010  . ABDOMINAL PAIN, EPIGASTRIC 07/31/2010    Past Medical History:  Diagnosis Date  . Atypical chest pain 06/04/2017  . Chronic headache S/P CERVICAL FUSION'S  . Cystitis, interstitial    frequency/urgency/nocturia  . DDD (degenerative disc disease), cervical   . Dry eyes   . Frequency of urination   . Hyperlipidemia   .  Mild acid reflux OCCASIONAL--  WATCHES DIET  . Nocturia   . Numbness s/p cerival fusion   right side pain and numbness   . Pulmonary nodule noted in  2010   right upper lobe -- stable (monitored by pcp)  . Status post dilation of esophageal narrowing 2011  . Urge incontinence of urine     Family History  Problem Relation Age of Onset  . Hypertension Mother   . Heart failure Mother   . Heart disease Brother   . Stroke Maternal Grandmother   . Hypertension Brother   . Heart disease Brother    Past Surgical History:  Procedure Laterality Date  . CATARACT EXTRACTION, BILATERAL    . CERVICAL DISKECTOMY AND FUSION  05-14-2002   C5 - 7  . CYSTO WITH HYDRODISTENSION  07/19/2011   Procedure: CYSTOSCOPY/HYDRODISTENSION;  Surgeon: Malka So;  Location: [facility];  Service: Urology;  Laterality: N/A;  CYSTOSCOPY WITH HYDORDISTENTION OF BLADDER AND INSTILLATION OF MARCAINE AND PYRIDIUM  . cysto/ hod  03-10-2009   [facility]  . CYSTOSCOPY N/A 05/01/2013   Procedure: CYSTOSCOPY;  Surgeon: Reece Packer, MD;  Location: [facility];  Service: Urology;  Laterality: N/A;  . CYSTOSCOPY WITH INJECTION  06/17/2012   Procedure: CYSTOSCOPY WITH INJECTION;  Surgeon: Reece Packer, MD;  Location: [place];  Service: Urology;  Laterality: N/A;  BOTOX  . CYSTOSCOPY WITH INJECTION N/A 12/08/2012   Procedure: CYSTOSCOPY WITH BOTOX INJECTION;  Surgeon: Reece Packer, MD;  Location: [place];  Service: Urology;  Laterality: N/A;  . laparoscopy ovarian cystectomy  2000 (APPROX)  . left rotator cuff repair  OCT 2009  . POSTERIOR FUSION CERVICAL SPINE  06-29-2009   C4 - 5  . PUBOVAGINAL SLING N/A 05/01/2013   Procedure:  Lowella Dell;  Surgeon: Reece Packer, MD;  Location: [facility];  Service: Urology;  Laterality: N/A;  . ROTATOR CUFF REPAIR  2001   RIGHT SHOULDER  . TONSILLECTOMY  1981  . VAGINAL HYSTERECTOMY  1992   Social History   Social History Narrative  . Not on file     Objective: Vital Signs: BP (!) 123/58   Pulse 65   Resp 14   Ht 5\' 5"   (1.651 m)   Wt 140 lb (63.5 kg)   BMI 23.30 kg/m    Physical Exam  Constitutional: She is oriented to person, place, and time. She appears well-developed and well-nourished.  HENT:  Head: Normocephalic and atraumatic.  Eyes: Conjunctivae and EOM are normal.  Neck: Normal range of motion.  Cardiovascular: Normal rate, regular rhythm, normal heart sounds and intact distal pulses.  Pulmonary/Chest: Effort normal and breath sounds normal.  Abdominal: Soft. Bowel sounds are normal.  Lymphadenopathy:    She has no cervical adenopathy.  Neurological: She is alert and oriented to person, place, and time.  Skin: Skin is warm and dry. Capillary refill takes less than 2 seconds.  Psychiatric: She has a normal mood and affect. Her behavior is normal.  Nursing note and vitals reviewed.    Musculoskeletal Exam: C-spine and thoracic and lumbar spine limited range of motion. She painful range of motion of her left shoulder. Elbow joints wrist joints are good range of motion. She has DIP PIP thickening and difficulty making a fist. Hip joints knee joints ankles MTPs PIPs with good range of motion with no synovitis.  CDAI Exam: No CDAI exam completed.    Investigation:  No additional findings. CBC Latest Ref Rng & Units 11/05/2016 04/27/2013 12/08/2012  WBC 3.8 - 10.8 K/uL 5.7 6.1 -  Hemoglobin 11.7 - 15.5 g/dL 13.4 14.9 14.2  Hematocrit 35.0 - 45.0 % 41.3 44.9 -  Platelets 140 - 400 K/uL 237 232 -   CMP Latest Ref Rng & Units 05/31/2017 11/05/2016 04/27/2013  Glucose 65 - 99 mg/dL 81 77 93  BUN 6 - 24 mg/dL 14 18 22   Creatinine 0.57 - 1.00 mg/dL 0.77 0.77 0.81  Sodium 134 - 144 mmol/L 144 142 140  Potassium 3.5 - 5.2 mmol/L 4.2 4.7 4.8  Chloride 96 - 106 mmol/L 103 106 103  CO2 20 - 29 mmol/L 25 29 30   Calcium 8.7 - 10.2 mg/dL 10.0 9.7 9.3  Total Protein 6.1 - 8.1 g/dL - 6.5 -  Total Bilirubin 0.2 - 1.2 mg/dL - 0.5 -  Alkaline Phos 33 - 130 U/L - 65 -  AST 10 - 35 U/L - 21 -  ALT 6 - 29 U/L  - 17 -    Imaging: No results found.  Speciality Comments: No specialty comments available.    Procedures:  Large Joint Inj: L glenohumeral on 08/29/2017 10:41 AM Indications: pain Details: 27 G 1.5 in needle, posterior approach  Arthrogram: No  Medications: 1.5 mL lidocaine 1 %; 40 mg triamcinolone acetonide 40 MG/ML Aspirate: 0 mL Outcome: tolerated well, no immediate complications Procedure, treatment alternatives, risks and benefits explained, specific risks discussed. Consent was given by the patient. Immediately prior to procedure a time out was called to verify the correct patient, procedure, equipment, support staff and site/side marked as required. Patient was prepped and draped in the usual sterile fashion.     Allergies: Cymbalta [duloxetine hcl] and Lyrica [pregabalin]   Assessment / Plan:     Visit Diagnoses: Age-related osteoporosis without current pathological fracture - BMD decreased by -10% and a T score of -3.0 on DXA 08/27/2016. Reclast (10/2016) -will reschedule her repeat Reclast in February 2019. She will need repeat DEXA in December 2019. Plan: VITAMIN D 25 Hydroxy (Vit-D Deficiency, Fractures)  Pain  of left shoulder: Patient sitting or discomfort and nocturnal pain. After informed consent was obtained the left shoulder joint was injected with cortisone as described above. She tolerated the procedure well.  DDD (degenerative disc disease), cervical - Status post fusion 3. She has limited range of motion.  DDD (degenerative disc disease), thoracic. She has limited range of motion.  DDD (degenerative disc disease), lumbar. She continues to have some discomfort. Tizanidine is helpful. She requested a refill which I sent today.  Primary osteoarthritis of both hands: She has severe osteoarthritis in her hands. Joint protection and muscle strengthening was discussed.  Primary osteoarthritis of both knees: Doing well  S/P rotator cuff repair -  Bilateral  Primary insomnia  Dyslipidemia  History of peptic ulcer disease  Medication monitoring encounter - Plan: CBC with Differential/Platelet, COMPLETE METABOLIC PANEL WITH GFR    Orders: Orders Placed This Encounter  Procedures  . Large Joint Inj  . CBC with Differential/Platelet  . COMPLETE METABOLIC PANEL WITH GFR  . VITAMIN D 25 Hydroxy (Vit-D Deficiency, Fractures)   Meds ordered this encounter  Medications  . tiZANidine (ZANAFLEX) 4 MG tablet    Sig: Take ONE Tablet PO QHS as needed    Dispense:  30 tablet    Refill:  3    Face-to-face time spent with patient was 30 minutes. Greater than 50% of time was spent  in counseling and coordination of care.  Follow-Up Instructions: Return in about 6 months (around 02/27/2018) for Osteoarthritis, Osteoporosis.   Bo Merino, MD  Note - This record has been created using Editor, commissioning.  Chart creation errors have been sought, but may not always  have been located. Such creation errors do not reflect on  the standard of medical care.

## 2017-08-27 ENCOUNTER — Ambulatory Visit: Payer: Medicare Other | Admitting: Rheumatology

## 2017-08-27 DIAGNOSIS — L820 Inflamed seborrheic keratosis: Secondary | ICD-10-CM | POA: Diagnosis not present

## 2017-08-27 DIAGNOSIS — D2271 Melanocytic nevi of right lower limb, including hip: Secondary | ICD-10-CM | POA: Diagnosis not present

## 2017-08-27 DIAGNOSIS — D2230 Melanocytic nevi of unspecified part of face: Secondary | ICD-10-CM | POA: Diagnosis not present

## 2017-08-27 DIAGNOSIS — D2272 Melanocytic nevi of left lower limb, including hip: Secondary | ICD-10-CM | POA: Diagnosis not present

## 2017-08-27 DIAGNOSIS — L570 Actinic keratosis: Secondary | ICD-10-CM | POA: Diagnosis not present

## 2017-08-27 DIAGNOSIS — D225 Melanocytic nevi of trunk: Secondary | ICD-10-CM | POA: Diagnosis not present

## 2017-08-27 DIAGNOSIS — Z411 Encounter for cosmetic surgery: Secondary | ICD-10-CM | POA: Diagnosis not present

## 2017-08-27 DIAGNOSIS — Z85828 Personal history of other malignant neoplasm of skin: Secondary | ICD-10-CM | POA: Diagnosis not present

## 2017-08-27 DIAGNOSIS — D485 Neoplasm of uncertain behavior of skin: Secondary | ICD-10-CM | POA: Diagnosis not present

## 2017-08-27 DIAGNOSIS — Z23 Encounter for immunization: Secondary | ICD-10-CM | POA: Diagnosis not present

## 2017-08-27 DIAGNOSIS — L918 Other hypertrophic disorders of the skin: Secondary | ICD-10-CM | POA: Diagnosis not present

## 2017-08-29 ENCOUNTER — Encounter: Payer: Self-pay | Admitting: Rheumatology

## 2017-08-29 ENCOUNTER — Ambulatory Visit (INDEPENDENT_AMBULATORY_CARE_PROVIDER_SITE_OTHER): Payer: Medicare Other | Admitting: Rheumatology

## 2017-08-29 VITALS — BP 123/58 | HR 65 | Resp 14 | Ht 65.0 in | Wt 140.0 lb

## 2017-08-29 DIAGNOSIS — M5136 Other intervertebral disc degeneration, lumbar region: Secondary | ICD-10-CM | POA: Diagnosis not present

## 2017-08-29 DIAGNOSIS — Z8711 Personal history of peptic ulcer disease: Secondary | ICD-10-CM | POA: Diagnosis not present

## 2017-08-29 DIAGNOSIS — M5030 Other cervical disc degeneration, unspecified cervical region: Secondary | ICD-10-CM | POA: Diagnosis not present

## 2017-08-29 DIAGNOSIS — M19041 Primary osteoarthritis, right hand: Secondary | ICD-10-CM

## 2017-08-29 DIAGNOSIS — Z9889 Other specified postprocedural states: Secondary | ICD-10-CM | POA: Diagnosis not present

## 2017-08-29 DIAGNOSIS — F5101 Primary insomnia: Secondary | ICD-10-CM

## 2017-08-29 DIAGNOSIS — M810 Age-related osteoporosis without current pathological fracture: Secondary | ICD-10-CM | POA: Diagnosis not present

## 2017-08-29 DIAGNOSIS — M170 Bilateral primary osteoarthritis of knee: Secondary | ICD-10-CM

## 2017-08-29 DIAGNOSIS — M5134 Other intervertebral disc degeneration, thoracic region: Secondary | ICD-10-CM | POA: Diagnosis not present

## 2017-08-29 DIAGNOSIS — Z5181 Encounter for therapeutic drug level monitoring: Secondary | ICD-10-CM

## 2017-08-29 DIAGNOSIS — E785 Hyperlipidemia, unspecified: Secondary | ICD-10-CM

## 2017-08-29 DIAGNOSIS — M19042 Primary osteoarthritis, left hand: Secondary | ICD-10-CM

## 2017-08-29 DIAGNOSIS — M25512 Pain in left shoulder: Secondary | ICD-10-CM

## 2017-08-29 DIAGNOSIS — M25412 Effusion, left shoulder: Secondary | ICD-10-CM

## 2017-08-29 MED ORDER — TIZANIDINE HCL 4 MG PO TABS: ORAL_TABLET | ORAL | 3 refills | Status: DC

## 2017-08-29 MED ORDER — TRIAMCINOLONE ACETONIDE 40 MG/ML IJ SUSP
40.0000 mg | INTRAMUSCULAR | Status: AC | PRN
  Administered 2017-08-29: 40 mg via INTRA_ARTICULAR

## 2017-08-29 MED ORDER — LIDOCAINE HCL 1 % IJ SOLN
1.5000 mL | INTRAMUSCULAR | Status: AC | PRN
  Administered 2017-08-29: 1.5 mL

## 2017-08-30 LAB — CBC WITH DIFFERENTIAL/PLATELET
Basophils Absolute: 12 cells/uL (ref 0–200)
Basophils Relative: 0.2 %
EOS PCT: 0.7 %
Eosinophils Absolute: 41 cells/uL (ref 15–500)
HEMATOCRIT: 41 % (ref 35.0–45.0)
Hemoglobin: 13.5 g/dL (ref 11.7–15.5)
Lymphs Abs: 2897 cells/uL (ref 850–3900)
MCH: 28.5 pg (ref 27.0–33.0)
MCHC: 32.9 g/dL (ref 32.0–36.0)
MCV: 86.7 fL (ref 80.0–100.0)
MONOS PCT: 5.8 %
MPV: 9.5 fL (ref 7.5–12.5)
NEUTROS PCT: 44.2 %
Neutro Abs: 2608 cells/uL (ref 1500–7800)
PLATELETS: 260 10*3/uL (ref 140–400)
RBC: 4.73 10*6/uL (ref 3.80–5.10)
RDW: 13 % (ref 11.0–15.0)
TOTAL LYMPHOCYTE: 49.1 %
WBC mixed population: 342 cells/uL (ref 200–950)
WBC: 5.9 10*3/uL (ref 3.8–10.8)

## 2017-08-30 LAB — COMPLETE METABOLIC PANEL WITH GFR
AG Ratio: 2.1 (calc) (ref 1.0–2.5)
ALKALINE PHOSPHATASE (APISO): 60 U/L (ref 33–130)
ALT: 20 U/L (ref 6–29)
AST: 26 U/L (ref 10–35)
Albumin: 4.6 g/dL (ref 3.6–5.1)
BUN: 15 mg/dL (ref 7–25)
CO2: 28 mmol/L (ref 20–32)
CREATININE: 0.76 mg/dL (ref 0.50–1.05)
Calcium: 9.8 mg/dL (ref 8.6–10.4)
Chloride: 104 mmol/L (ref 98–110)
GFR, Est African American: 100 mL/min/{1.73_m2} (ref >=60)
GFR, Est Non African American: 86 mL/min/{1.73_m2} (ref >=60)
GLOBULIN: 2.2 g/dL (ref 1.9–3.7)
GLUCOSE: 84 mg/dL (ref 65–99)
Potassium: 4.6 mmol/L (ref 3.5–5.3)
SODIUM: 140 mmol/L (ref 135–146)
Total Bilirubin: 0.6 mg/dL (ref 0.2–1.2)
Total Protein: 6.8 g/dL (ref 6.1–8.1)

## 2017-08-30 LAB — VITAMIN D 25 HYDROXY (VIT D DEFICIENCY, FRACTURES): Vit D, 25-Hydroxy: 41 ng/mL (ref 30–100)

## 2017-08-30 NOTE — Progress Notes (Signed)
Within normal limits

## 2017-09-05 DIAGNOSIS — Z1231 Encounter for screening mammogram for malignant neoplasm of breast: Secondary | ICD-10-CM | POA: Diagnosis not present

## 2017-09-12 DIAGNOSIS — R922 Inconclusive mammogram: Secondary | ICD-10-CM | POA: Diagnosis not present

## 2017-10-15 DIAGNOSIS — Z01419 Encounter for gynecological examination (general) (routine) without abnormal findings: Secondary | ICD-10-CM | POA: Diagnosis not present

## 2017-10-15 DIAGNOSIS — Z1389 Encounter for screening for other disorder: Secondary | ICD-10-CM | POA: Diagnosis not present

## 2017-11-11 ENCOUNTER — Telehealth: Payer: Self-pay | Admitting: Rheumatology

## 2017-11-11 DIAGNOSIS — Z79899 Other long term (current) drug therapy: Secondary | ICD-10-CM

## 2017-11-11 NOTE — Telephone Encounter (Signed)
Patient left a voicemail stating that she has not received a call to set up her Reclast infusion.  Patient states she saw Dr. Estanislado Pandy in December and was told it would be scheduled in January or February.

## 2017-11-11 NOTE — Telephone Encounter (Signed)
Patient advised we need updated labs. Once they results will put  Orders in.

## 2017-11-12 ENCOUNTER — Other Ambulatory Visit: Payer: Self-pay

## 2017-11-12 DIAGNOSIS — Z79899 Other long term (current) drug therapy: Secondary | ICD-10-CM

## 2017-11-13 ENCOUNTER — Other Ambulatory Visit: Payer: Self-pay | Admitting: *Deleted

## 2017-11-13 DIAGNOSIS — M810 Age-related osteoporosis without current pathological fracture: Secondary | ICD-10-CM

## 2017-11-13 LAB — COMPLETE METABOLIC PANEL WITH GFR
AG Ratio: 2.4 (calc) (ref 1.0–2.5)
ALBUMIN MSPROF: 4.6 g/dL (ref 3.6–5.1)
ALT: 18 U/L (ref 6–29)
AST: 21 U/L (ref 10–35)
Alkaline phosphatase (APISO): 60 U/L (ref 33–130)
BUN: 14 mg/dL (ref 7–25)
CALCIUM: 9.8 mg/dL (ref 8.6–10.4)
CO2: 30 mmol/L (ref 20–32)
CREATININE: 0.8 mg/dL (ref 0.50–1.05)
Chloride: 106 mmol/L (ref 98–110)
GFR, EST AFRICAN AMERICAN: 94 mL/min/{1.73_m2} (ref >=60)
GFR, EST NON AFRICAN AMERICAN: 81 mL/min/{1.73_m2} (ref >=60)
GLOBULIN: 1.9 g/dL (ref 1.9–3.7)
Glucose, Bld: 74 mg/dL (ref 65–99)
Potassium: 4.7 mmol/L (ref 3.5–5.3)
SODIUM: 143 mmol/L (ref 135–146)
Total Bilirubin: 0.6 mg/dL (ref 0.2–1.2)
Total Protein: 6.5 g/dL (ref 6.1–8.1)

## 2017-11-13 LAB — CBC WITH DIFFERENTIAL/PLATELET
Basophils Absolute: 11 cells/uL (ref 0–200)
Basophils Relative: 0.2 %
EOS PCT: 1.1 %
Eosinophils Absolute: 58 cells/uL (ref 15–500)
HCT: 41 % (ref 35.0–45.0)
HEMOGLOBIN: 13.6 g/dL (ref 11.7–15.5)
LYMPHS ABS: 2528 {cells}/uL (ref 850–3900)
MCH: 28.4 pg (ref 27.0–33.0)
MCHC: 33.2 g/dL (ref 32.0–36.0)
MCV: 85.6 fL (ref 80.0–100.0)
MONOS PCT: 6.6 %
MPV: 9.7 fL (ref 7.5–12.5)
NEUTROS ABS: 2353 {cells}/uL (ref 1500–7800)
Neutrophils Relative %: 44.4 %
Platelets: 250 10*3/uL (ref 140–400)
RBC: 4.79 10*6/uL (ref 3.80–5.10)
RDW: 12.9 % (ref 11.0–15.0)
Total Lymphocyte: 47.7 %
WBC mixed population: 350 cells/uL (ref 200–950)
WBC: 5.3 10*3/uL (ref 3.8–10.8)

## 2017-11-26 ENCOUNTER — Telehealth: Payer: Self-pay | Admitting: Rheumatology

## 2017-11-26 NOTE — Telephone Encounter (Signed)
Patient advised Reclast Orders have been placed and she has been provided with number to call and schedule her appointment. Patient reminded she will need to come to office and have labs 10 days after her infusion.

## 2017-11-26 NOTE — Telephone Encounter (Signed)
Patient called stating that she still hasn't been set up with her Reclast infusion.  Patient states she had her labwork done on 3/6 and was told that she would be receiving a call to set up the appointment.

## 2017-12-06 ENCOUNTER — Ambulatory Visit (HOSPITAL_COMMUNITY)
Admission: RE | Admit: 2017-12-06 | Discharge: 2017-12-06 | Disposition: A | Discharge status: Home / Self Care | Payer: Medicare Other | Source: Ambulatory Visit | Attending: Rheumatology | Admitting: Rheumatology

## 2017-12-06 DIAGNOSIS — M810 Age-related osteoporosis without current pathological fracture: Secondary | ICD-10-CM | POA: Diagnosis not present

## 2017-12-06 MED ORDER — ZOLEDRONIC ACID 5 MG/100ML IV SOLN
INTRAVENOUS | Status: AC
  Administered 2017-12-06: 5 mg via INTRAVENOUS
  Filled 2017-12-06: qty 100

## 2017-12-06 MED ORDER — ZOLEDRONIC ACID 5 MG/100ML IV SOLN
5.0000 mg | Freq: Once | INTRAVENOUS | Status: AC
  Administered 2017-12-06: 5 mg via INTRAVENOUS

## 2017-12-17 ENCOUNTER — Other Ambulatory Visit: Payer: Self-pay | Admitting: *Deleted

## 2017-12-17 DIAGNOSIS — Z79899 Other long term (current) drug therapy: Secondary | ICD-10-CM

## 2017-12-17 LAB — COMPLETE METABOLIC PANEL WITH GFR
AG Ratio: 2.6 (calc) — ABNORMAL HIGH (ref 1.0–2.5)
ALBUMIN MSPROF: 4.7 g/dL (ref 3.6–5.1)
ALKALINE PHOSPHATASE (APISO): 71 U/L (ref 33–130)
ALT: 17 U/L (ref 6–29)
AST: 25 U/L (ref 10–35)
BUN: 15 mg/dL (ref 7–25)
CALCIUM: 9.6 mg/dL (ref 8.6–10.4)
CO2: 31 mmol/L (ref 20–32)
CREATININE: 0.77 mg/dL (ref 0.50–1.05)
Chloride: 107 mmol/L (ref 98–110)
GFR, EST NON AFRICAN AMERICAN: 85 mL/min/{1.73_m2} (ref >=60)
GFR, Est African American: 98 mL/min/{1.73_m2} (ref >=60)
GLOBULIN: 1.8 g/dL — AB (ref 1.9–3.7)
GLUCOSE: 89 mg/dL (ref 65–99)
Potassium: 4.7 mmol/L (ref 3.5–5.3)
SODIUM: 142 mmol/L (ref 135–146)
TOTAL PROTEIN: 6.5 g/dL (ref 6.1–8.1)
Total Bilirubin: 0.4 mg/dL (ref 0.2–1.2)

## 2017-12-17 NOTE — Progress Notes (Signed)
Office Visit Note  Patient: Brenda Gray             Date of Birth: 28-Aug-1961           MRN: [id number]             PCP: Leanna Battles, MD Referring: Leanna Battles, MD Visit Date: 12/19/2017 Occupation: @GUAROCC @    Subjective:  Left shoulder pain   History of Present Illness: Brenda Gray is a 57 y.o. female with history of osteoporosis and DDD.  Patient states that she had her Reclast infusion about 10 days ago.  She continues to take calcium and vitamin D. She presents today with left shoulder pain.  She has bilateral rotator cuff repairs performed by Dr. Marlou Sa years ago.  She had a left shoulder cortisone injection performed on 08/29/2017 which provided relief.  She has been having increased pain over the past 3 weeks.  She states she is also noticed increased stiffness and weakness in her left shoulder.  She states that she attends strength and balance classes daily which has been helping with her range of motion but she has noticed she is unable to lift as much weight as she used to.  She states that when the pain is severe she will use a heating pad and takes ibuprofen or tramadol.  She states that her right shoulder is doing well.  She states she continues to have chronic neck and back pain.  She states she works on neck exercises on a daily basis.  She states that she sees Dr. Ernestina Patches for S3 injections periodically.  She states she is going to follow-up with him soon. She states she continues have stiffness in her bilateral hands.  She states she notices minimal swelling.    Activities of Daily Living:  Patient reports morning stiffness for 1 hour.   Patient Reports nocturnal pain.  Difficulty dressing/grooming: Denies Difficulty climbing stairs: Denies Difficulty getting out of chair: Denies Difficulty using hands for taps, buttons, cutlery, and/or writing: Reports   Review of Systems  Constitutional: Negative for fatigue.  HENT: Negative for mouth sores,  mouth dryness and nose dryness.   Eyes: Negative for pain, visual disturbance and dryness.  Respiratory: Negative for cough, hemoptysis, shortness of breath and difficulty breathing.   Cardiovascular: Negative for chest pain, palpitations, hypertension and swelling in legs/feet.  Gastrointestinal: Negative for blood in stool, constipation and diarrhea.  Endocrine: Negative for increased urination.  Genitourinary: Negative for painful urination.  Musculoskeletal: Positive for arthralgias, joint pain, joint swelling and morning stiffness. Negative for myalgias, muscle weakness, muscle tenderness and myalgias.  Skin: Negative for color change, pallor, rash, hair loss, nodules/bumps, skin tightness, ulcers and sensitivity to sunlight.  Allergic/Immunologic: Negative for susceptible to infections.  Neurological: Negative for dizziness, numbness, headaches and weakness.  Hematological: Negative for swollen glands.  Psychiatric/Behavioral: Negative for depressed mood and sleep disturbance. The patient is not nervous/anxious.     PMFS History:  Patient Active Problem List   Diagnosis Date Noted  . Atypical chest pain 06/04/2017  . S/P rotator cuff repair 11/04/2016  . Dyslipidemia 11/04/2016  . History of peptic ulcer disease 11/04/2016  . Interstitial cystitis 11/04/2016  . DJD (degenerative joint disease), cervical 10/29/2016  . Spondylosis of lumbar region without myelopathy or radiculopathy 10/29/2016  . Primary osteoarthritis of both knees 10/29/2016  . 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 Primary insomnia 10/29/2016  . History of recurrent cystitis 10/29/2016  . Primary osteoarthritis of  both hands 10/29/2016  . Morton neuroma, right 10/15/2016  . DYSPHAGIA UNSPECIFIED 07/31/2010  . ABDOMINAL PAIN, EPIGASTRIC 07/31/2010    Past Medical History:  Diagnosis Date  . Atypical chest pain 06/04/2017  . Chronic headache S/P CERVICAL FUSION'S  . Cystitis, interstitial      frequency/urgency/nocturia  . DDD (degenerative disc disease), cervical   . Dry eyes   . Frequency of urination   . Hyperlipidemia   . Mild acid reflux OCCASIONAL--  WATCHES DIET  . Nocturia   . Numbness s/p cerival fusion   right side pain and numbness   . Pulmonary nodule noted in 2010   right upper lobe -- stable (monitored by pcp)  . Status post dilation of esophageal narrowing 2011  . Urge incontinence of urine     Family History  Problem Relation Age of Onset  . Hypertension Mother   . Heart failure Mother   . Heart disease Brother   . Stroke Maternal Grandmother   . Hypertension Brother   . Heart disease Brother   . Healthy Son    Past Surgical History:  Procedure Laterality Date  . CATARACT EXTRACTION, BILATERAL    . CERVICAL DISKECTOMY AND FUSION  05-14-2002   C5 - 7  . CYSTO WITH HYDRODISTENSION  07/19/2011   Procedure: CYSTOSCOPY/HYDRODISTENSION;  Surgeon: Malka So;  Location: [place];  Service: Urology;  Laterality: N/A;  CYSTOSCOPY WITH HYDORDISTENTION OF BLADDER AND INSTILLATION OF MARCAINE AND PYRIDIUM  . cysto/ hod  03-10-2009   [facility]  . CYSTOSCOPY N/A 05/01/2013   Procedure: CYSTOSCOPY;  Surgeon: Reece Packer, MD;  Location: [facility];  Service: Urology;  Laterality: N/A;  . CYSTOSCOPY WITH INJECTION  06/17/2012   Procedure: CYSTOSCOPY WITH INJECTION;  Surgeon: Reece Packer, MD;  Location: [place];  Service: Urology;  Laterality: N/A;  BOTOX  . CYSTOSCOPY WITH INJECTION N/A 12/08/2012   Procedure: CYSTOSCOPY WITH BOTOX INJECTION;  Surgeon: Reece Packer, MD;  Location: [place];  Service: Urology;  Laterality: N/A;  . laparoscopy ovarian cystectomy  2000 (APPROX)  . left rotator cuff repair  OCT 2009  . POSTERIOR FUSION CERVICAL SPINE  06-29-2009   C4 - 5  . PUBOVAGINAL SLING N/A 05/01/2013   Procedure:  Lowella Dell;  Surgeon: Reece Packer, MD;  Location:  [facility];  Service: Urology;  Laterality: N/A;  . ROTATOR CUFF REPAIR  2001   RIGHT SHOULDER  . TONSILLECTOMY  1981  . VAGINAL HYSTERECTOMY  1992   Social History   Social History Narrative  . Not on file     Objective: Vital Signs: BP 112/73 (BP Location: Right Arm, Patient Position: Sitting, Cuff Size: Normal)   Pulse 69   Resp 15   Ht 5\' 5"  (1.651 m)   Wt 142 lb (64.4 kg)   BMI 23.63 kg/m    Physical Exam  Constitutional: She is oriented to person, place, and time. She appears well-developed and well-nourished.  HENT:  Head: Normocephalic and atraumatic.  Eyes: Conjunctivae and EOM are normal.  Neck: Normal range of motion.  Cardiovascular: Normal rate, regular rhythm, normal heart sounds and intact distal pulses.  Pulmonary/Chest: Effort normal and breath sounds normal.  Abdominal: Soft. Bowel sounds are normal.  Lymphadenopathy:    She has no cervical adenopathy.  Neurological: She is alert and oriented to person, place, and time.  Skin: Skin is warm and dry. Capillary refill takes less than  2 seconds.  Psychiatric: She has a normal mood and affect. Her behavior is normal.  Nursing note and vitals reviewed.    Musculoskeletal Exam: C-spine very limited range of motion.  Thoracic and lumbar spine good range of motion.  She has midline spinal tenderness in the lumbar region.  Shoulder joints good range of motion with some discomfort with left shoulder range of motion.  Elbow joints, wrist joints, MCPs, PIPs, DIPs good range of motion with no synovitis.  She has PIP and DIP synovial thickening consistent with osteoarthritis.  Hip joints, knee joints, ankle joints, MTPs, PIPs, DIPs good range of motion with no synovitis.  She has bilateral knee crepitus.  No warmth or effusion of knee joints.  She is tenderness of bilateral trochanteric bursa.  CDAI Exam: No CDAI exam completed.    Investigation: No additional findings. CBC Latest Ref Rng & Units  11/12/2017 08/29/2017 11/05/2016  WBC 3.8 - 10.8 Thousand/uL 5.3 5.9 5.7  Hemoglobin 11.7 - 15.5 g/dL 13.6 13.5 13.4  Hematocrit 35.0 - 45.0 % 41.0 41.0 41.3  Platelets 140 - 400 Thousand/uL 250 260 237   CMP Latest Ref Rng & Units 12/17/2017 11/12/2017 08/29/2017  Glucose 65 - 99 mg/dL 89 74 84  BUN 7 - 25 mg/dL 15 14 15   Creatinine 0.50 - 1.05 mg/dL 0.77 0.80 0.76  Sodium 135 - 146 mmol/L 142 143 140  Potassium 3.5 - 5.3 mmol/L 4.7 4.7 4.6  Chloride 98 - 110 mmol/L 107 106 104  CO2 20 - 32 mmol/L 31 30 28   Calcium 8.6 - 10.4 mg/dL 9.6 9.8 9.8  Total Protein 6.1 - 8.1 g/dL 6.5 6.5 6.8  Total Bilirubin 0.2 - 1.2 mg/dL 0.4 0.6 0.6  Alkaline Phos 33 - 130 U/L - - -  AST 10 - 35 U/L 25 21 26   ALT 6 - 29 U/L 17 18 20     Imaging: No results found.  Speciality Comments: No specialty comments available.    Procedures:  Large Joint Inj: L glenohumeral on 12/19/2017 8:43 AM Indications: pain Details: 27 G 1.5 in needle, posterior approach  Arthrogram: No  Medications: 1.5 mL lidocaine 1 %; 40 mg triamcinolone acetonide 40 MG/ML Aspirate: 0 mL Outcome: tolerated well, no immediate complications Procedure, treatment alternatives, risks and benefits explained, specific risks discussed. Consent was given by the patient. Immediately prior to procedure a time out was called to verify the correct patient, procedure, equipment, support staff and site/side marked as required. Patient was prepped and draped in the usual sterile fashion.     Allergies: Cymbalta [duloxetine hcl] and Lyrica [pregabalin]   Assessment / Plan:     Visit Diagnoses: Age-related osteoporosis without current pathological fracture - BMD decreased by -10% and a T score of -3.0 on DXA 08/27/2016 -She had a Reclast infusion 10 days ago.  She came for lab work on 12/17/17, which was within normal limits.  She continues to take vitamin D and calcium on a daily basis.    Chronic left shoulder pain: She has good range of motion on  exam.  She has discomfort with internal rotation and forward flexion.  She had a cortisone injection performed on 08/29/2017 which provided relief for several months.  Over the past 3 weeks she has been having increased pain in her left shoulder and has noticed stiffness and discomfort with range of motion.  She performs exercises on a daily basis for strengthening and has not been able to lift the same [employer] she  was in the past.  She would like a cortisone injection today in the office.  She was advised to monitor her blood pressure mostly following the cortisone injection today.  DDD (degenerative disc disease), cervical: She has limited range of motion of her C-spine.  She works on stretching exercises on a daily basis.  DDD (degenerative disc disease), thoracic: No midline spinal tenderness.  DDD (degenerative disc disease), lumbar: She has midline spinal tenderness in the lumbar region.  She sees Dr. Ernestina Patches.  She has gone S3 injections in the past.  She is in a schedule appointment with him soon.  Primary osteoarthritis of both hands: She has PIP and DIP synovial thickening consistent with ulcer arthritis.  Joint protection and muscle strengthening were discussed.  S/P rotator cuff repair - Bilateral: Right shoulder is doing well.  She has good range of motion on exam.  She has no discomfort in her right shoulder at this time.  Left shoulder good range of motion with discomfort.  We performed a cortisone injection of her left shoulder today in the office.  Other medical conditions are listed as follows:  Primary insomnia  Dyslipidemia  History of peptic ulcer disease    Orders: Orders Placed This Encounter  Procedures  . Large Joint Inj   No orders of the defined types were placed in this encounter.   Face-to-face time spent with patient was 30 minutes. >50% of time was spent in counseling and coordination of care.  Follow-Up Instructions: Return in about 6 months (around  06/20/2018) for Osteoporosis, DDD.   Ofilia Neas, PA-C   I examined and evaluated the patient with Hazel Sams PA.  She had painful range of motion of her shoulder joint on examination.  After different treatment treatment options were discussed she wanted to proceed with the cortisone injection.  The procedure as described above.  The plan of care was discussed as noted above.  Bo Merino, MD  Note - This record has been created using Editor, commissioning.  Chart creation errors have been sought, but may not always  have been located. Such creation errors do not reflect on  the standard of medical care.

## 2017-12-18 NOTE — Progress Notes (Signed)
WNL

## 2017-12-19 ENCOUNTER — Ambulatory Visit (INDEPENDENT_AMBULATORY_CARE_PROVIDER_SITE_OTHER): Payer: Medicare Other | Admitting: Physician Assistant

## 2017-12-19 ENCOUNTER — Encounter: Payer: Self-pay | Admitting: Physician Assistant

## 2017-12-19 VITALS — BP 112/73 | HR 69 | Resp 15 | Ht 65.0 in | Wt 142.0 lb

## 2017-12-19 DIAGNOSIS — M5030 Other cervical disc degeneration, unspecified cervical region: Secondary | ICD-10-CM

## 2017-12-19 DIAGNOSIS — M5136 Other intervertebral disc degeneration, lumbar region: Secondary | ICD-10-CM | POA: Diagnosis not present

## 2017-12-19 DIAGNOSIS — M5134 Other intervertebral disc degeneration, thoracic region: Secondary | ICD-10-CM

## 2017-12-19 DIAGNOSIS — G8929 Other chronic pain: Secondary | ICD-10-CM

## 2017-12-19 DIAGNOSIS — M25512 Pain in left shoulder: Secondary | ICD-10-CM

## 2017-12-19 DIAGNOSIS — Z8711 Personal history of peptic ulcer disease: Secondary | ICD-10-CM

## 2017-12-19 DIAGNOSIS — Z9889 Other specified postprocedural states: Secondary | ICD-10-CM | POA: Diagnosis not present

## 2017-12-19 DIAGNOSIS — M19042 Primary osteoarthritis, left hand: Secondary | ICD-10-CM

## 2017-12-19 DIAGNOSIS — M19041 Primary osteoarthritis, right hand: Secondary | ICD-10-CM

## 2017-12-19 DIAGNOSIS — F5101 Primary insomnia: Secondary | ICD-10-CM | POA: Diagnosis not present

## 2017-12-19 DIAGNOSIS — M810 Age-related osteoporosis without current pathological fracture: Secondary | ICD-10-CM

## 2017-12-19 DIAGNOSIS — E785 Hyperlipidemia, unspecified: Secondary | ICD-10-CM | POA: Diagnosis not present

## 2017-12-19 MED ORDER — TRIAMCINOLONE ACETONIDE 40 MG/ML IJ SUSP
40.0000 mg | INTRAMUSCULAR | Status: AC | PRN
  Administered 2017-12-19: 40 mg via INTRA_ARTICULAR

## 2017-12-19 MED ORDER — LIDOCAINE HCL 1 % IJ SOLN
1.5000 mL | INTRAMUSCULAR | Status: AC | PRN
  Administered 2017-12-19: 1.5 mL

## 2017-12-23 DIAGNOSIS — Z961 Presence of intraocular lens: Secondary | ICD-10-CM | POA: Diagnosis not present

## 2018-01-29 ENCOUNTER — Other Ambulatory Visit: Payer: Self-pay | Admitting: Rheumatology

## 2018-01-29 NOTE — Telephone Encounter (Signed)
Last visit: 12/19/2017 Next visit: 06/23/2018  Last fill: 08/29/2017  Okay to refill tizanidine?

## 2018-01-29 NOTE — Telephone Encounter (Signed)
ok 

## 2018-02-04 ENCOUNTER — Ambulatory Visit (INDEPENDENT_AMBULATORY_CARE_PROVIDER_SITE_OTHER): Payer: Medicare Other | Admitting: Physical Medicine and Rehabilitation

## 2018-02-04 ENCOUNTER — Encounter

## 2018-02-04 ENCOUNTER — Encounter (INDEPENDENT_AMBULATORY_CARE_PROVIDER_SITE_OTHER): Payer: Self-pay | Admitting: Physical Medicine and Rehabilitation

## 2018-02-04 VITALS — BP 117/68 | HR 58

## 2018-02-04 DIAGNOSIS — M5442 Lumbago with sciatica, left side: Secondary | ICD-10-CM | POA: Diagnosis not present

## 2018-02-04 DIAGNOSIS — G8929 Other chronic pain: Secondary | ICD-10-CM

## 2018-02-04 DIAGNOSIS — M5416 Radiculopathy, lumbar region: Secondary | ICD-10-CM

## 2018-02-04 DIAGNOSIS — M5441 Lumbago with sciatica, right side: Secondary | ICD-10-CM

## 2018-02-04 DIAGNOSIS — M7918 Myalgia, other site: Secondary | ICD-10-CM

## 2018-02-04 NOTE — Progress Notes (Signed)
Brenda Gray - 57 y.o. female MRN [id number]  Date of birth: 1961/04/29  Office Visit Note: Visit Date: 02/04/2018 PCP: Leanna Battles, MD Referred by: Leanna Battles, MD  Subjective: Chief Complaint  Patient presents with  . Lower Back - Pain   HPI: Brenda Gray is a 57 year old female that I have known quite well over the last many years treating both her cervical and lumbar spine.  She has a history of prior cervical posterior and anterior fusion.  She has had lumbar spine issues with chronic axial low back pain which was treated with radiofrequency ablation of the L4-5 facet joints with some relief of her back pain several years ago.  I last saw her in August 2017 and completed bilateral S1 transforaminal epidural steroid injection.  Last MRI was in 2015 and this did show chronic endplate degenerative change which was progressive.  She comes in today stating that she has had several months of worsening low back pain and bilateral radicular type leg pain which goes into the buttock and hamstring into the calf.  She reports her left leg is worse than the right leg and she does get some numbness in the bottom of her left foot and is worse at night in bed.  She reports worsening with sitting for any length of time and for standing and walking.  She does take tramadol during the day and usually has one in the morning and then if it is severe sometimes she will have to in the morning.  She is not taking medications at night.  She has had a history of intolerances to Cymbalta as well as Lyrica.  She has been treated for age-related osteoporosis.  She does follow with Dr. Estanislado Pandy.  She has a diagnosis of degenerative joint disease and osteoarthritis.  She has had issues with chronic upper and lower body and quadrant pain.  She has had a history of interstitial cystitis.  No working diagnosis of fibromyalgia.  She has not had a new trauma since I saw her.  She reports her pain is very similar to what  we have treated in the past.  She has been taking the tramadol as well as doing exercises and she has started a new exercise program which she feels like really has helped her overall but she continues to have this worsening low back and radicular leg pain.  She does have fairly current x-rays over the last several years showing no fractures or dislocations or other red flag complaints.  She has had no focal weakness.  No bowel or bladder changes.  No fevers chills or night sweats or weight loss.   Review of Systems  Constitutional: Negative for chills, fever, malaise/fatigue and weight loss.  HENT: Negative for hearing loss and sinus pain.   Eyes: Negative for blurred vision, double vision and photophobia.  Respiratory: Negative for cough and shortness of breath.   Cardiovascular: Negative for chest pain, palpitations and leg swelling.  Gastrointestinal: Negative for abdominal pain, nausea and vomiting.  Genitourinary: Negative for flank pain.  Musculoskeletal: Positive for back pain and joint pain. Negative for myalgias.       Bilateral left more than right hip and leg pain  Skin: Negative for itching and rash.  Neurological: Negative for tremors, focal weakness and weakness.  Endo/Heme/Allergies: Negative.   Psychiatric/Behavioral: Negative for depression.  All other systems reviewed and are negative.  Otherwise per HPI.  Assessment & Plan: Visit Diagnoses:  1. Lumbar radiculopathy   2.  Chronic bilateral low back pain with bilateral sciatica   3. Myofascial pain syndrome     Plan: Findings:  Chronic pain syndrome and chronic worsening axial low back pain left more than right S1 radicular pattern pain.  We have feel that this is probably related to degenerative changes of the endplate at K9-X8 and facet arthropathy with mild lateral recess narrowing.  She has no listhesis and no fractures or other nerve impingement.  Bilateral S1 injections have helped tremendously in the past and she has  failed conservative care otherwise.  She has a pretty significant history of cervical fusion.  We have discussed with her in the past about potential spinal cord stimulator trial but she is done well with intermittent transforaminal epidural steroid injections at L5-S1.  She has had an ablation done of the L4-5 facet joints but this was many years ago.  Overall she does well maintained with medication injection.  I think the best approach is diagnostic and hopefully therapeutic bilateral S1 transforaminal injection once again.  We discussed activity modification and talked at length about her condition.    Meds & Orders: No orders of the defined types were placed in this encounter.  No orders of the defined types were placed in this encounter.   Follow-up: Return for Bilateral S1 transforaminal epidural steroid injection.   Procedures: No procedures performed  No notes on file   Clinical History: Lumbar spine dated 07/04/2012 demonstrates degenerative endplate changes particular at L5-S1 and L4-5 with some facet arthropathy at L4-5.   She reports that she quit smoking about 11 years ago. Her smoking use included cigarettes. She has never used smokeless tobacco. No results for input(s): HGBA1C, LABURIC in the last 8760 hours.  Objective:  VS:  HT:    WT:   BMI:     BP:117/68  HR:(!) 58bpm  TEMP: ( )  RESP:  Physical Exam  Constitutional: She is oriented to person, place, and time. She appears well-developed and well-nourished.  Eyes: Pupils are equal, round, and reactive to light. Conjunctivae and EOM are normal.  Neck:  Limited cervical range of motion with pain on extension  Cardiovascular: Normal rate and intact distal pulses.  Pulmonary/Chest: Effort normal.  Musculoskeletal:  Patient ambulates without aid with normal gait.  She does have pain with extension of the lumbar spine.  She does have paraspinal tenderness and pain over the greater trochanters but not exquisite pain.  She  does have positive tender points in taut bands.  She has no pain with hip rotation she has good distal strength.  Neurological: She is alert and oriented to person, place, and time. She exhibits normal muscle tone. Coordination normal.  Skin: Skin is warm and dry. No rash noted. No erythema.  Psychiatric: She has a normal mood and affect. Her behavior is normal.  Nursing note and vitals reviewed.   Ortho Exam Imaging: No results found.  Past Medical/Family/Surgical/Social History: Medications & Allergies reviewed per EMR, new medications updated. Patient Active Problem List   Diagnosis Date Noted  . Atypical chest pain 06/04/2017  . S/P rotator cuff repair 11/04/2016  . Dyslipidemia 11/04/2016  . History of peptic ulcer disease 11/04/2016  . Interstitial cystitis 11/04/2016  . DJD (degenerative joint disease), cervical 10/29/2016  . Spondylosis of lumbar region without myelopathy or radiculopathy 10/29/2016  . Primary osteoarthritis of both knees 10/29/2016  . 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 Primary insomnia 10/29/2016  . History of recurrent cystitis 10/29/2016  .  Primary osteoarthritis of both hands 10/29/2016  . Morton neuroma, right 10/15/2016  . DYSPHAGIA UNSPECIFIED 07/31/2010  . ABDOMINAL PAIN, EPIGASTRIC 07/31/2010   Past Medical History:  Diagnosis Date  . Atypical chest pain 06/04/2017  . Chronic headache S/P CERVICAL FUSION'S  . Cystitis, interstitial    frequency/urgency/nocturia  . DDD (degenerative disc disease), cervical   . Dry eyes   . Frequency of urination   . Hyperlipidemia   . Mild acid reflux OCCASIONAL--  WATCHES DIET  . Nocturia   . Numbness s/p cerival fusion   right side pain and numbness   . Pulmonary nodule noted in 2010   right upper lobe -- stable (monitored by pcp)  . Status post dilation of esophageal narrowing 2011  . Urge incontinence of urine    Family History  Problem Relation Age of Onset  . Hypertension  Mother   . Heart failure Mother   . Heart disease Brother   . Stroke Maternal Grandmother   . Hypertension Brother   . Heart disease Brother   . Healthy Son    Past Surgical History:  Procedure Laterality Date  . CATARACT EXTRACTION, BILATERAL    . CERVICAL DISKECTOMY AND FUSION  05-14-2002   C5 - 7  . CYSTO WITH HYDRODISTENSION  07/19/2011   Procedure: CYSTOSCOPY/HYDRODISTENSION;  Surgeon: Malka So;  Location: [place];  Service: Urology;  Laterality: N/A;  CYSTOSCOPY WITH HYDORDISTENTION OF BLADDER AND INSTILLATION OF MARCAINE AND PYRIDIUM  . cysto/ hod  03-10-2009   [facility]  . CYSTOSCOPY N/A 05/01/2013   Procedure: CYSTOSCOPY;  Surgeon: Reece Packer, MD;  Location: [facility];  Service: Urology;  Laterality: N/A;  . CYSTOSCOPY WITH INJECTION  06/17/2012   Procedure: CYSTOSCOPY WITH INJECTION;  Surgeon: Reece Packer, MD;  Location: [place];  Service: Urology;  Laterality: N/A;  BOTOX  . CYSTOSCOPY WITH INJECTION N/A 12/08/2012   Procedure: CYSTOSCOPY WITH BOTOX INJECTION;  Surgeon: Reece Packer, MD;  Location: [place];  Service: Urology;  Laterality: N/A;  . laparoscopy ovarian cystectomy  2000 (APPROX)  . left rotator cuff repair  OCT 2009  . POSTERIOR FUSION CERVICAL SPINE  06-29-2009   C4 - 5  . PUBOVAGINAL SLING N/A 05/01/2013   Procedure:  Lowella Dell;  Surgeon: Reece Packer, MD;  Location: [facility];  Service: Urology;  Laterality: N/A;  . ROTATOR CUFF REPAIR  2001   RIGHT SHOULDER  . TONSILLECTOMY  1981  . VAGINAL HYSTERECTOMY  1992   Social History   Occupational History  . Not on file  Tobacco Use  . Smoking status: Former Smoker    Types: Cigarettes    Last attempt to quit: 07/16/2006    Years since quitting: 11.5  . Smokeless tobacco: Never Used  Substance and Sexual Activity  . Alcohol use: Yes    Comment: rare  . Drug use: No  . Sexual activity:  Not on file

## 2018-02-04 NOTE — Progress Notes (Signed)
  Numeric Pain Rating Scale and Functional Assessment Average Pain 7 Pain Right Now 5 My pain is constant, sharp, tingling and aching Pain is worse with: walking, bending and standing Pain improves with: rest, heat/ice, therapy/exercise, medication and injections   In the last MONTH (on 0-10 scale) has pain interfered with the following?  1. General activity like being  able to carry out your everyday physical activities such as walking, climbing stairs, carrying groceries, or moving a chair?  Rating(3)  2. Relation with others like being able to carry out your usual social activities and roles such as  activities at home, at work and in your community. Rating(3)  3. Enjoyment of life such that you have  been bothered by emotional problems such as feeling anxious, depressed or irritable?  Rating(3)

## 2018-02-10 ENCOUNTER — Ambulatory Visit (INDEPENDENT_AMBULATORY_CARE_PROVIDER_SITE_OTHER): Payer: Medicare Other | Admitting: Physical Medicine and Rehabilitation

## 2018-02-10 ENCOUNTER — Encounter (INDEPENDENT_AMBULATORY_CARE_PROVIDER_SITE_OTHER): Payer: Self-pay | Admitting: Physical Medicine and Rehabilitation

## 2018-02-10 ENCOUNTER — Ambulatory Visit (INDEPENDENT_AMBULATORY_CARE_PROVIDER_SITE_OTHER): Payer: Medicare Other

## 2018-02-10 VITALS — BP 124/75 | HR 68

## 2018-02-10 DIAGNOSIS — M5416 Radiculopathy, lumbar region: Secondary | ICD-10-CM | POA: Diagnosis not present

## 2018-02-10 MED ORDER — BETAMETHASONE SOD PHOS & ACET 6 (3-3) MG/ML IJ SUSP
12.0000 mg | Freq: Once | INTRAMUSCULAR | Status: AC
  Administered 2018-02-10: 12 mg

## 2018-02-10 NOTE — Patient Instructions (Signed)

## 2018-02-10 NOTE — Progress Notes (Signed)
Pt states pain in lower back mostly on the left side that radiates into both right and left leg. Pt states pain is always there nothing makes it worse, tramadol eases pain.  .Numeric Pain Rating Scale and Functional Assessment Average Pain 5   In the last MONTH (on 0-10 scale) has pain interfered with the following?  1. General activity like being  able to carry out your everyday physical activities such as walking, climbing stairs, carrying groceries, or moving a chair?  Rating(1)   +Driver, -BT, -Dye Allergies.

## 2018-02-11 NOTE — Procedures (Signed)
S1 Lumbosacral Transforaminal Epidural Steroid Injection - Sub-Pedicular Approach with Fluoroscopic Guidance   Patient: Brenda Gray      Date of Birth: 1960-09-24 MRN: [id number] PCP: Leanna Battles, MD      Visit Date: 02/10/2018   Universal Protocol:    Date/Time: 06/04/195:51 AM  Consent Given By: the patient  Position:  PRONE  Additional Comments: Vital signs were monitored before and after the procedure. Patient was prepped and draped in the usual sterile fashion. The correct patient, procedure, and site was verified.   Injection Procedure Details:  Procedure Site One Meds Administered:  Meds ordered this encounter  Medications  . betamethasone acetate-betamethasone sodium phosphate (CELESTONE) injection 12 mg    Laterality: Bilateral  Location/Site:  S1 Foramen   Needle size: 22 ga.  Needle type: Spinal  Needle Placement: Transforaminal  Findings:   -Comments: Excellent flow of contrast along the nerve and into the epidural space.  Procedure Details: After squaring off the sacral end-plate to get a true AP view, the C-arm was positioned so that the best possible view of the S1 foramen was visualized. The soft tissues overlying this structure were infiltrated with 2-3 ml. of 1% Lidocaine without Epinephrine.    The spinal needle was inserted toward the target using a "trajectory" view along the fluoroscope beam.  Under AP and lateral visualization, the needle was advanced so it did not puncture dura. Biplanar projections were used to confirm position. Aspiration was confirmed to be negative for CSF and/or blood. A 1-2 ml. volume of Isovue-250 was injected and flow of contrast was noted at each level. Radiographs were obtained for documentation purposes.   After attaining the desired flow of contrast documented above, a 0.5 to 1.0 ml test dose of 0.25% Marcaine was injected into each respective transforaminal space.  The patient was observed for 90 seconds  post injection.  After no sensory deficits were reported, and normal lower extremity motor function was noted,   the above injectate was administered so that equal amounts of the injectate were placed at each foramen (level) into the transforaminal epidural space.   Additional Comments:  No complications occurred Dressing: Band-Aid    Post-procedure details: Patient was observed during the procedure. Post-procedure instructions were reviewed.  Patient left the clinic in stable condition.

## 2018-02-11 NOTE — Progress Notes (Signed)
Brenda Gray - 57 y.o. female MRN [id number]  Date of birth: August 11, 1961  Office Visit Note: Visit Date: 02/10/2018 PCP: Leanna Battles, MD Referred by: Leanna Battles, MD  Subjective: Chief Complaint  Patient presents with  . Lower Back - Pain  . Right Leg - Pain  . Left Leg - Pain   HPI: Brenda Gray is a 57 year old female that comes in today for bilateral S1 transforaminal epidural steroid injection.  Please see our prior evaluation and management note for further details and justification.   ROS Otherwise per HPI.  Assessment & Plan: Visit Diagnoses:  1. Lumbar radiculopathy     Plan: No additional findings.   Meds & Orders:  Meds ordered this encounter  Medications  . betamethasone acetate-betamethasone sodium phosphate (CELESTONE) injection 12 mg    Orders Placed This Encounter  Procedures  . XR C-ARM NO REPORT  . Epidural Steroid injection    Follow-up: Return if symptoms worsen or fail to improve.   Procedures: No procedures performed  S1 Lumbosacral Transforaminal Epidural Steroid Injection - Sub-Pedicular Approach with Fluoroscopic Guidance   Patient: Brenda Gray      Date of Birth: May 27, 1961 MRN: [id number] PCP: Leanna Battles, MD      Visit Date: 02/10/2018   Universal Protocol:    Date/Time: 06/04/195:51 AM  Consent Given By: the patient  Position:  PRONE  Additional Comments: Vital signs were monitored before and after the procedure. Patient was prepped and draped in the usual sterile fashion. The correct patient, procedure, and site was verified.   Injection Procedure Details:  Procedure Site One Meds Administered:  Meds ordered this encounter  Medications  . betamethasone acetate-betamethasone sodium phosphate (CELESTONE) injection 12 mg    Laterality: Bilateral  Location/Site:  S1 Foramen   Needle size: 22 ga.  Needle type: Spinal  Needle Placement: Transforaminal  Findings:   -Comments: Excellent  flow of contrast along the nerve and into the epidural space.  Procedure Details: After squaring off the sacral end-plate to get a true AP view, the C-arm was positioned so that the best possible view of the S1 foramen was visualized. The soft tissues overlying this structure were infiltrated with 2-3 ml. of 1% Lidocaine without Epinephrine.    The spinal needle was inserted toward the target using a "trajectory" view along the fluoroscope beam.  Under AP and lateral visualization, the needle was advanced so it did not puncture dura. Biplanar projections were used to confirm position. Aspiration was confirmed to be negative for CSF and/or blood. A 1-2 ml. volume of Isovue-250 was injected and flow of contrast was noted at each level. Radiographs were obtained for documentation purposes.   After attaining the desired flow of contrast documented above, a 0.5 to 1.0 ml test dose of 0.25% Marcaine was injected into each respective transforaminal space.  The patient was observed for 90 seconds post injection.  After no sensory deficits were reported, and normal lower extremity motor function was noted,   the above injectate was administered so that equal amounts of the injectate were placed at each foramen (level) into the transforaminal epidural space.   Additional Comments:  No complications occurred Dressing: Band-Aid    Post-procedure details: Patient was observed during the procedure. Post-procedure instructions were reviewed.  Patient left the clinic in stable condition.    Clinical History: Lumbar spine dated 07/04/2012 demonstrates degenerative endplate changes particular at L5-S1 and L4-5 with some facet arthropathy at L4-5.   She reports that she  quit smoking about 11 years ago. Her smoking use included cigarettes. She has never used smokeless tobacco. No results for input(s): HGBA1C, LABURIC in the last 8760 hours.  Objective:  VS:  HT:    WT:   BMI:     BP:124/75  HR:68bpm   TEMP: ( )  RESP:  Physical Exam  Ortho Exam Imaging: Xr C-arm No Report  Result Date: 02/10/2018 Please see Notes or Procedures tab for imaging impression.   Past Medical/Family/Surgical/Social History: Medications & Allergies reviewed per EMR, new medications updated. Patient Active Problem List   Diagnosis Date Noted  . Atypical chest pain 06/04/2017  . S/P rotator cuff repair 11/04/2016  . Dyslipidemia 11/04/2016  . History of peptic ulcer disease 11/04/2016  . Interstitial cystitis 11/04/2016  . DJD (degenerative joint disease), cervical 10/29/2016  . Spondylosis of lumbar region without myelopathy or radiculopathy 10/29/2016  . Primary osteoarthritis of both knees 10/29/2016  . 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 Primary insomnia 10/29/2016  . History of recurrent cystitis 10/29/2016  . Primary osteoarthritis of both hands 10/29/2016  . Morton neuroma, right 10/15/2016  . DYSPHAGIA UNSPECIFIED 07/31/2010  . ABDOMINAL PAIN, EPIGASTRIC 07/31/2010   Past Medical History:  Diagnosis Date  . Atypical chest pain 06/04/2017  . Chronic headache S/P CERVICAL FUSION'S  . Cystitis, interstitial    frequency/urgency/nocturia  . DDD (degenerative disc disease), cervical   . Dry eyes   . Frequency of urination   . Hyperlipidemia   . Mild acid reflux OCCASIONAL--  WATCHES DIET  . Nocturia   . Numbness s/p cerival fusion   right side pain and numbness   . Pulmonary nodule noted in 2010   right upper lobe -- stable (monitored by pcp)  . Status post dilation of esophageal narrowing 2011  . Urge incontinence of urine    Family History  Problem Relation Age of Onset  . Hypertension Mother   . Heart failure Mother   . Heart disease Brother   . Stroke Maternal Grandmother   . Hypertension Brother   . Heart disease Brother   . Healthy Son    Past Surgical History:  Procedure Laterality Date  . CATARACT EXTRACTION, BILATERAL    . CERVICAL DISKECTOMY AND  FUSION  05-14-2002   C5 - 7  . CYSTO WITH HYDRODISTENSION  07/19/2011   Procedure: CYSTOSCOPY/HYDRODISTENSION;  Surgeon: Malka So;  Location: [place];  Service: Urology;  Laterality: N/A;  CYSTOSCOPY WITH HYDORDISTENTION OF BLADDER AND INSTILLATION OF MARCAINE AND PYRIDIUM  . cysto/ hod  03-10-2009   [facility]  . CYSTOSCOPY N/A 05/01/2013   Procedure: CYSTOSCOPY;  Surgeon: Reece Packer, MD;  Location: [facility];  Service: Urology;  Laterality: N/A;  . CYSTOSCOPY WITH INJECTION  06/17/2012   Procedure: CYSTOSCOPY WITH INJECTION;  Surgeon: Reece Packer, MD;  Location: [place];  Service: Urology;  Laterality: N/A;  BOTOX  . CYSTOSCOPY WITH INJECTION N/A 12/08/2012   Procedure: CYSTOSCOPY WITH BOTOX INJECTION;  Surgeon: Reece Packer, MD;  Location: [place];  Service: Urology;  Laterality: N/A;  . laparoscopy ovarian cystectomy  2000 (APPROX)  . left rotator cuff repair  OCT 2009  . POSTERIOR FUSION CERVICAL SPINE  06-29-2009   C4 - 5  . PUBOVAGINAL SLING N/A 05/01/2013   Procedure:  Lowella Dell;  Surgeon: Reece Packer, MD;  Location: [facility];  Service: Urology;  Laterality: N/A;  . ROTATOR CUFF  REPAIR  2001   RIGHT SHOULDER  . TONSILLECTOMY  1981  . VAGINAL HYSTERECTOMY  1992   Social History   Occupational History  . Not on file  Tobacco Use  . Smoking status: Former Smoker    Types: Cigarettes    Last attempt to quit: 07/16/2006    Years since quitting: 11.5  . Smokeless tobacco: Never Used  Substance and Sexual Activity  . Alcohol use: Yes    Comment: rare  . Drug use: No  . Sexual activity: Not on file

## 2018-02-28 ENCOUNTER — Ambulatory Visit: Payer: Medicare Other | Admitting: Rheumatology

## 2018-04-04 DIAGNOSIS — M810 Age-related osteoporosis without current pathological fracture: Secondary | ICD-10-CM | POA: Diagnosis not present

## 2018-04-04 DIAGNOSIS — E7849 Other hyperlipidemia: Secondary | ICD-10-CM | POA: Diagnosis not present

## 2018-04-04 DIAGNOSIS — R82998 Other abnormal findings in urine: Secondary | ICD-10-CM | POA: Diagnosis not present

## 2018-04-11 DIAGNOSIS — M5136 Other intervertebral disc degeneration, lumbar region: Secondary | ICD-10-CM | POA: Diagnosis not present

## 2018-04-11 DIAGNOSIS — Z0000 Encounter for general adult medical examination without abnormal findings: Secondary | ICD-10-CM | POA: Diagnosis not present

## 2018-04-11 DIAGNOSIS — R1319 Other dysphagia: Secondary | ICD-10-CM | POA: Diagnosis not present

## 2018-04-11 DIAGNOSIS — Z6832 Body mass index (BMI) 32.0-32.9, adult: Secondary | ICD-10-CM | POA: Diagnosis not present

## 2018-04-11 DIAGNOSIS — Z87891 Personal history of nicotine dependence: Secondary | ICD-10-CM | POA: Diagnosis not present

## 2018-04-11 DIAGNOSIS — Z1389 Encounter for screening for other disorder: Secondary | ICD-10-CM | POA: Diagnosis not present

## 2018-04-11 DIAGNOSIS — M810 Age-related osteoporosis without current pathological fracture: Secondary | ICD-10-CM | POA: Diagnosis not present

## 2018-04-11 DIAGNOSIS — F3289 Other specified depressive episodes: Secondary | ICD-10-CM | POA: Diagnosis not present

## 2018-04-11 DIAGNOSIS — R3121 Asymptomatic microscopic hematuria: Secondary | ICD-10-CM | POA: Diagnosis not present

## 2018-04-11 DIAGNOSIS — E7849 Other hyperlipidemia: Secondary | ICD-10-CM | POA: Diagnosis not present

## 2018-04-16 DIAGNOSIS — Z1212 Encounter for screening for malignant neoplasm of rectum: Secondary | ICD-10-CM | POA: Diagnosis not present

## 2018-04-29 ENCOUNTER — Other Ambulatory Visit: Payer: Self-pay | Admitting: Internal Medicine

## 2018-04-29 DIAGNOSIS — Z87891 Personal history of nicotine dependence: Secondary | ICD-10-CM

## 2018-04-29 DIAGNOSIS — R1310 Dysphagia, unspecified: Secondary | ICD-10-CM

## 2018-05-09 DIAGNOSIS — R350 Frequency of micturition: Secondary | ICD-10-CM | POA: Diagnosis not present

## 2018-05-09 DIAGNOSIS — R3121 Asymptomatic microscopic hematuria: Secondary | ICD-10-CM | POA: Diagnosis not present

## 2018-05-09 DIAGNOSIS — N3946 Mixed incontinence: Secondary | ICD-10-CM | POA: Diagnosis not present

## 2018-05-13 ENCOUNTER — Ambulatory Visit
Admission: RE | Admit: 2018-05-13 | Discharge: 2018-05-13 | Disposition: A | Discharge status: Home / Self Care | Payer: Medicare Other | Source: Ambulatory Visit | Attending: Internal Medicine | Admitting: Internal Medicine

## 2018-05-13 ENCOUNTER — Ambulatory Visit: Payer: Medicare Other

## 2018-05-13 DIAGNOSIS — R1310 Dysphagia, unspecified: Secondary | ICD-10-CM

## 2018-05-13 DIAGNOSIS — K449 Diaphragmatic hernia without obstruction or gangrene: Secondary | ICD-10-CM | POA: Diagnosis not present

## 2018-05-15 ENCOUNTER — Ambulatory Visit
Admission: RE | Admit: 2018-05-15 | Discharge: 2018-05-15 | Disposition: A | Discharge status: Home / Self Care | Payer: Medicare Other | Source: Ambulatory Visit | Attending: Internal Medicine | Admitting: Internal Medicine

## 2018-05-15 DIAGNOSIS — Z87891 Personal history of nicotine dependence: Secondary | ICD-10-CM

## 2018-05-19 DIAGNOSIS — R310 Gross hematuria: Secondary | ICD-10-CM | POA: Diagnosis not present

## 2018-05-19 DIAGNOSIS — K5730 Diverticulosis of large intestine without perforation or abscess without bleeding: Secondary | ICD-10-CM | POA: Diagnosis not present

## 2018-05-19 DIAGNOSIS — R3121 Asymptomatic microscopic hematuria: Secondary | ICD-10-CM | POA: Diagnosis not present

## 2018-05-29 DIAGNOSIS — N393 Stress incontinence (female) (male): Secondary | ICD-10-CM | POA: Diagnosis not present

## 2018-05-29 DIAGNOSIS — N3010 Interstitial cystitis (chronic) without hematuria: Secondary | ICD-10-CM | POA: Diagnosis not present

## 2018-05-29 DIAGNOSIS — R3121 Asymptomatic microscopic hematuria: Secondary | ICD-10-CM | POA: Diagnosis not present

## 2018-05-30 DIAGNOSIS — Z23 Encounter for immunization: Secondary | ICD-10-CM | POA: Diagnosis not present

## 2018-06-18 ENCOUNTER — Encounter: Payer: Self-pay | Admitting: Gastroenterology

## 2018-06-19 ENCOUNTER — Telehealth (INDEPENDENT_AMBULATORY_CARE_PROVIDER_SITE_OTHER): Payer: Self-pay

## 2018-06-19 ENCOUNTER — Ambulatory Visit (INDEPENDENT_AMBULATORY_CARE_PROVIDER_SITE_OTHER): Payer: Self-pay

## 2018-06-19 ENCOUNTER — Encounter: Payer: Self-pay | Admitting: Physician Assistant

## 2018-06-19 ENCOUNTER — Telehealth: Payer: Self-pay

## 2018-06-19 ENCOUNTER — Ambulatory Visit (INDEPENDENT_AMBULATORY_CARE_PROVIDER_SITE_OTHER): Payer: Medicare Other | Admitting: Physician Assistant

## 2018-06-19 VITALS — BP 129/80 | HR 69 | Resp 13 | Ht 64.5 in | Wt 138.2 lb

## 2018-06-19 DIAGNOSIS — M810 Age-related osteoporosis without current pathological fracture: Secondary | ICD-10-CM

## 2018-06-19 DIAGNOSIS — M5136 Other intervertebral disc degeneration, lumbar region: Secondary | ICD-10-CM | POA: Diagnosis not present

## 2018-06-19 DIAGNOSIS — M170 Bilateral primary osteoarthritis of knee: Secondary | ICD-10-CM | POA: Diagnosis not present

## 2018-06-19 DIAGNOSIS — F5101 Primary insomnia: Secondary | ICD-10-CM | POA: Diagnosis not present

## 2018-06-19 DIAGNOSIS — M5134 Other intervertebral disc degeneration, thoracic region: Secondary | ICD-10-CM

## 2018-06-19 DIAGNOSIS — E785 Hyperlipidemia, unspecified: Secondary | ICD-10-CM

## 2018-06-19 DIAGNOSIS — M5030 Other cervical disc degeneration, unspecified cervical region: Secondary | ICD-10-CM

## 2018-06-19 DIAGNOSIS — N3010 Interstitial cystitis (chronic) without hematuria: Secondary | ICD-10-CM

## 2018-06-19 DIAGNOSIS — Z9889 Other specified postprocedural states: Secondary | ICD-10-CM | POA: Diagnosis not present

## 2018-06-19 DIAGNOSIS — M19041 Primary osteoarthritis, right hand: Secondary | ICD-10-CM | POA: Diagnosis not present

## 2018-06-19 DIAGNOSIS — M25512 Pain in left shoulder: Secondary | ICD-10-CM | POA: Diagnosis not present

## 2018-06-19 DIAGNOSIS — M79672 Pain in left foot: Secondary | ICD-10-CM | POA: Diagnosis not present

## 2018-06-19 DIAGNOSIS — G8929 Other chronic pain: Secondary | ICD-10-CM

## 2018-06-19 DIAGNOSIS — M19042 Primary osteoarthritis, left hand: Secondary | ICD-10-CM

## 2018-06-19 DIAGNOSIS — Z8711 Personal history of peptic ulcer disease: Secondary | ICD-10-CM | POA: Diagnosis not present

## 2018-06-19 MED ORDER — TRIAMCINOLONE ACETONIDE 40 MG/ML IJ SUSP
40.0000 mg | INTRAMUSCULAR | Status: AC | PRN
  Administered 2018-06-19: 40 mg via INTRA_ARTICULAR

## 2018-06-19 MED ORDER — TIZANIDINE HCL 4 MG PO TABS
ORAL_TABLET | ORAL | 2 refills | Status: DC
Start: 2018-06-19 — End: 2018-10-31

## 2018-06-19 MED ORDER — LIDOCAINE HCL 1 % IJ SOLN
1.0000 mL | INTRAMUSCULAR | Status: AC | PRN
  Administered 2018-06-19: 1 mL

## 2018-06-19 NOTE — Telephone Encounter (Signed)
Please apply for bilateral knee visco, per Taylor. Thanks!  

## 2018-06-19 NOTE — Telephone Encounter (Signed)
Submitted VOB for Othovisc series, bilateral knee.

## 2018-06-19 NOTE — Progress Notes (Signed)
Office Visit Note  Patient: Brenda Gray             Date of Birth: [birth year]           MRN: [id number]             PCP: Leanna Battles, MD Referring: Leanna Battles, MD Visit Date: 06/19/2018 Occupation: @GUAROCC @  Subjective:  Left shoulder pain   History of Present Illness: Brenda Gray is a 57 y.o. female with history of chronic shoulder pain, osteoporosis, osteoarthritis, and DDD. She had her last Reclast infusion in April 2019. She presents today with left shoulder pain. She has sharp pains. She reports her ROM is good and she stretches daily. She had a left glenohumeral injection on 12/19/2017 that did not provide relief.  She would like a subacromial injection today in the office.  She reports she continues to have chronic lower back pain.  She states that she sees Dr. Ernestina Patches and had an epidural injection on 02/10/2018.  She continues to have chronic pain.  She states she recently lifted something and has had some increased discomfort in her neck.  She states that she has chronic left-sided radiculopathy following her surgery.  She states that you she uses a heating pad on her neck which provides some relief.  She states that she has been having increased discomfort in both knee joints.  She states that her left knee grinds and pops.  She denies any joint swelling.  She would like to reapply for Visco gel injections for bilateral knee joints.  She reports that she goes to exercise class IV times a week which helps with range of motion and strengthening. She reports that 8 weeks ago her grandson kicked her left foot and she has had discomfort and swelling since.  She has tenderness on the dorsal aspect.  She would like an x-ray today.   Activities of Daily Living:  Patient reports morning stiffness for 1 hour.   Patient Reports nocturnal pain.  Difficulty dressing/grooming: Denies Difficulty climbing stairs: Denies Difficulty getting out of chair: Reports Difficulty  using hands for taps, buttons, cutlery, and/or writing: Denies  Review of Systems  Constitutional: Negative for fatigue.  HENT: Negative for mouth sores, trouble swallowing, trouble swallowing, mouth dryness and nose dryness.   Eyes: Negative for pain, redness, visual disturbance and dryness.  Respiratory: Negative for cough, hemoptysis, shortness of breath, wheezing and difficulty breathing.   Cardiovascular: Negative for chest pain, palpitations, hypertension and swelling in legs/feet.  Gastrointestinal: Negative for abdominal pain, blood in stool, constipation, diarrhea, nausea and vomiting.  Endocrine: Negative for increased urination.  Genitourinary: Negative for painful urination, nocturia and pelvic pain.  Musculoskeletal: Positive for arthralgias, joint pain, joint swelling and morning stiffness. Negative for myalgias, muscle weakness, muscle tenderness and myalgias.  Skin: Negative for color change, pallor, rash, hair loss, nodules/bumps, skin tightness, ulcers and sensitivity to sunlight.  Allergic/Immunologic: Negative for susceptible to infections.  Neurological: Negative for dizziness, light-headedness, numbness, headaches, memory loss and weakness.  Hematological: Negative for bruising/bleeding tendency and swollen glands.  Psychiatric/Behavioral: Negative for depressed mood, confusion and sleep disturbance. The patient is not nervous/anxious.     PMFS History:  Patient Active Problem List   Diagnosis Date Noted  . Atypical chest pain 06/04/2017  . S/P rotator cuff repair 11/04/2016  . Dyslipidemia 11/04/2016  . History of peptic ulcer disease 11/04/2016  . Interstitial cystitis 11/04/2016  . DJD (degenerative joint disease), cervical 10/29/2016  . Spondylosis of  lumbar region without myelopathy or radiculopathy 10/29/2016  . Primary osteoarthritis of both knees 10/29/2016  . 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 Primary insomnia 10/29/2016  . History  of recurrent cystitis 10/29/2016  . Primary osteoarthritis of both hands 10/29/2016  . Morton neuroma, right 10/15/2016  . DYSPHAGIA UNSPECIFIED 07/31/2010  . ABDOMINAL PAIN, EPIGASTRIC 07/31/2010    Past Medical History:  Diagnosis Date  . Atypical chest pain 06/04/2017  . Chronic headache S/P CERVICAL FUSION'S  . Cystitis, interstitial    frequency/urgency/nocturia  . DDD (degenerative disc disease), cervical   . Dry eyes   . Frequency of urination   . Hyperlipidemia   . Mild acid reflux OCCASIONAL--  WATCHES DIET  . Nocturia   . Numbness s/p cerival fusion   right side pain and numbness   . Pulmonary nodule noted in 2010   right upper lobe -- stable (monitored by pcp)  . Status post dilation of esophageal narrowing 2011  . Urge incontinence of urine     Family History  Problem Relation Age of Onset  . Hypertension Mother   . Heart failure Mother   . Heart disease Brother   . Stroke Maternal Grandmother   . Hypertension Brother   . Heart disease Brother   . Healthy Son    Past Surgical History:  Procedure Laterality Date  . CATARACT EXTRACTION, BILATERAL    . CERVICAL DISKECTOMY AND FUSION  05-14-2002   C5 - 7  . CYSTO WITH HYDRODISTENSION  07/19/2011   Procedure: CYSTOSCOPY/HYDRODISTENSION;  Surgeon: Malka So;  Location: [facility];  Service: Urology;  Laterality: N/A;  CYSTOSCOPY WITH HYDORDISTENTION OF BLADDER AND INSTILLATION OF MARCAINE AND PYRIDIUM  . cysto/ hod  03-10-2009   [facility]  . CYSTOSCOPY N/A 05/01/2013   Procedure: CYSTOSCOPY;  Surgeon: Reece Packer, MD;  Location: [facility];  Service: Urology;  Laterality: N/A;  . CYSTOSCOPY WITH INJECTION  06/17/2012   Procedure: CYSTOSCOPY WITH INJECTION;  Surgeon: Reece Packer, MD;  Location: [facility];  Service: Urology;  Laterality: N/A;  BOTOX  . CYSTOSCOPY WITH INJECTION N/A 12/08/2012   Procedure: CYSTOSCOPY WITH BOTOX INJECTION;  Surgeon: Reece Packer, MD;  Location: [facility];  Service: Urology;  Laterality: N/A;  . laparoscopy ovarian cystectomy  2000 (APPROX)  . left rotator cuff repair  OCT 2009  . POSTERIOR FUSION CERVICAL SPINE  06-29-2009   C4 - 5  . PUBOVAGINAL SLING N/A 05/01/2013   Procedure:  Lowella Dell;  Surgeon: Reece Packer, MD;  Location: [facility];  Service: Urology;  Laterality: N/A;  . ROTATOR CUFF REPAIR  2001   RIGHT SHOULDER  . TONSILLECTOMY  1981  . VAGINAL HYSTERECTOMY  1992   Social History   Social History Narrative  . Not on file    Objective: Vital Signs: BP 129/80 (BP Location: Left Arm, Patient Position: Sitting, Cuff Size: Normal)   Pulse 69   Resp 13   Ht 5' 4.5" (1.638 m)   Wt 138 lb 3.2 oz (62.7 kg)   BMI 23.36 kg/m    Physical Exam  Constitutional: She is oriented to person, place, and time. She appears well-developed and well-nourished.  HENT:  Head: Normocephalic and atraumatic.  Eyes: Conjunctivae and EOM are normal.  Neck: Normal range of motion.  Cardiovascular: Normal rate, regular rhythm, normal heart sounds and intact distal pulses.  Pulmonary/Chest: Effort normal and breath sounds normal.  Abdominal: Soft. Bowel sounds are normal.  Lymphadenopathy:    She has no cervical adenopathy.  Neurological: She is alert and oriented to person, place, and time.  Skin: Skin is warm and dry. Capillary refill takes less than 2 seconds.  Psychiatric: She has a normal mood and affect. Her behavior is normal.  Nursing note and vitals reviewed.    Musculoskeletal Exam: C-spine limited range of motion.  Thoracic and lumbar spine good range of motion.  No midline spinal tenderness.  No SI joint tenderness.  Right shoulder full range of motion with no discomfort.  Left shoulder full range of motion with discomfort.  She has tenderness of the left subacromial bursa.  Elbow joints, wrist joints, MCPs, PIPs, DIPs good range of motion no synovitis.   She has PIP and DIP synovial thickening consistent with osteoarthritis of bilateral hands.  She is complete fist formation bilaterally.  Hip joints good range of motion with no discomfort.  No tenderness of trochanter bursa bilaterally.  She has bilateral knee crepitus.  No warmth or effusion noted knee joints.  No tenderness or swelling of ankle joints.  She has tenderness on the dorsal aspect of the left foot especially overlying the fourth metatarsal.  No Achilles tendinitis or plantar fasciitis.  CDAI Exam: CDAI Score: Not documented Patient Global Assessment: Not documented; Provider Global Assessment: Not documented Swollen: Not documented; Tender: Not documented Joint Exam   Not documented   There is currently no information documented on the homunculus. Go to the Rheumatology activity and complete the homunculus joint exam.  Investigation: No additional findings.  Imaging: No results found.  Recent Labs: Lab Results  Component Value Date   WBC 5.3 11/12/2017   HGB 13.6 11/12/2017   PLT 250 11/12/2017   NA 142 12/17/2017   K 4.7 12/17/2017   CL 107 12/17/2017   CO2 31 12/17/2017   GLUCOSE 89 12/17/2017   BUN 15 12/17/2017   CREATININE 0.77 12/17/2017   BILITOT 0.4 12/17/2017   ALKPHOS 65 11/05/2016   AST 25 12/17/2017   ALT 17 12/17/2017   PROT 6.5 12/17/2017   ALBUMIN 4.3 11/05/2016   CALCIUM 9.6 12/17/2017   GFRAA 98 12/17/2017    Speciality Comments: No specialty comments available.  Procedures:  Large Joint Inj: L subacromial bursa on 06/19/2018 9:12 AM Indications: pain Details: 27 G 1.5 in needle, posterior approach  Arthrogram: No  Medications: 40 mg triamcinolone acetonide 40 MG/ML; 1 mL lidocaine 1 % Aspirate: 0 mL Outcome: tolerated well, no immediate complications Procedure, treatment alternatives, risks and benefits explained, specific risks discussed. Consent was given by the patient. Immediately prior to procedure a time out was called to  verify the correct patient, procedure, equipment, support staff and site/side marked as required. Patient was prepped and draped in the usual sterile fashion.     Allergies: Cymbalta [duloxetine hcl] and Lyrica [pregabalin]   Assessment / Plan:     Visit Diagnoses: Age-related osteoporosis without current pathological fracture - BMD decreased by -10% and a T score of -3.0 on DXA 08/27/2016.  A DEXA scan was ordered today that she will have performed after August 27, 2018.  She had a Reclast infusion in April 2019.  She takes a calcium and vitamin D supplement daily.  She has not had any recent falls.  She continues to go to exercise class 4 times weekly.  The importance of resistive exercises was discussed. - Plan: DG BONE DENSITY (DXA)  Chronic left shoulder pain: she  has a history of a left rotator cuff repair. She had a left glenohumeral injection on 12/19/2017 that did not provide any relief.  In the past she has had a subacromial injection provided significant relief.  She would like to have a subacromial injection performed today in the office.  She tolerated procedure well.  The procedure note is completed above.  Potential side effects were discussed.  She was advised to monitor blood pressure closely following the cortisone injection.  She was encouraged to continue to perform exercises on a regular basis.  DDD (degenerative disc disease), cervical: She has limited range of motion.  She has left-sided radiculopathy.  She has chronic neck pain and stiffness.  DDD (degenerative disc disease), thoracic: No midline spinal tenderness.  Good range of motion.  DDD (degenerative disc disease), lumbar: She is good range of motion with no discomfort.  No midline spinal tenderness.  She had an epidural steroid injection performed by Dr. Ernestina Patches on 02/10/2018.  She will be scheduling a repeat injection in the future with him.   Primary osteoarthritis of both hands: She has PIP and DIP synovial thickening  consistent with osteoarthritis of bilateral hands.  She is complete fist formation bilaterally.  No synovitis was noted.  Joint protection and muscle strengthening were discussed.  Primary osteoarthritis of both knees: No warmth or effusion.  She has good range of motion.  She has bilateral knee crepitus.  She has had bilateral euflexxa injections in the past. She would like to reapply for Visco gel injections.  We will proceed with applying through her insurance and notify her if they are approved.   S/P rotator cuff repair: Bilateral.  She is good range of motion bilateral shoulders.  She has left shoulder discomfort and would like a subacromial injection today in the office.  Pain in left foot -She reports that about 8 weeks ago her grandson kicked her left foot.  She states that at that time she noticed swelling and tenderness on the dorsal aspect especially tender at the fourth metatarsal.  She has not had an x-ray or follow-up since the injury.  She continues to have tenderness on exam today.She is able to bear full weight in the office and it has not been interfering with her daily activities.   She goes to exercise classes 4 days a week and has had difficulty jumping on her left foot.  She would like an x-ray of the left foot today. The x-ray did not reveal an obvious fracture.  She declined wearing a boot and rechecking the x-ray in the future.  She was advised to notify us if she develops increased pain or swelling.  Plan: XR Foot Complete Left   Primary insomnia: She takes Zanaflex 4 mg at bedtime.  Refill sent today.  Other medical conditions are listed as follows:  Dyslipidemia  History of peptic ulcer disease  Interstitial cystitis    Orders: Orders Placed This Encounter  Procedures  . Large Joint Inj  . DG BONE DENSITY (DXA)  . XR Foot Complete Left   Meds ordered this encounter  Medications  . tiZANidine (ZANAFLEX) 4 MG tablet    Sig: TAKE 1 TABLET BY MOUTH EVERY NIGHT  AT BEDTIME AS NEEDED    Dispense:  30 tablet    Refill:  2    Face-to-face time spent with patient was 30 minutes. Greater than 50% of time was spent in counseling and coordination of care.  Follow-Up Instructions: Return in about 6 months (  around 12/19/2018) for Osteoporosis, DDD, Osteoarthritis.   Ofilia Neas, PA-C  Note - This record has been created using Dragon software.  Chart creation errors have been sought, but may not always  have been located. Such creation errors do not reflect on  the standard of medical care.

## 2018-06-19 NOTE — Telephone Encounter (Signed)
Noted  

## 2018-06-23 ENCOUNTER — Ambulatory Visit: Payer: Medicare Other | Admitting: Rheumatology

## 2018-06-26 ENCOUNTER — Telehealth (INDEPENDENT_AMBULATORY_CARE_PROVIDER_SITE_OTHER): Payer: Self-pay

## 2018-06-26 NOTE — Telephone Encounter (Signed)
LMOM for patient to call and schedule her bilateral knee injections.

## 2018-06-26 NOTE — Telephone Encounter (Signed)
Can you please schedule patient an appointment with Lovena Le or Dr. Estanislado Pandy?  Thank You  Patient is approved for Orthovisc series, bilateral knee. [place] Patient will be responsible for 20% of the allowable amount. No Co-pay No PA required

## 2018-07-07 ENCOUNTER — Ambulatory Visit (INDEPENDENT_AMBULATORY_CARE_PROVIDER_SITE_OTHER): Payer: Medicare Other | Admitting: Physician Assistant

## 2018-07-07 DIAGNOSIS — M170 Bilateral primary osteoarthritis of knee: Secondary | ICD-10-CM | POA: Diagnosis not present

## 2018-07-07 MED ORDER — LIDOCAINE HCL 1 % IJ SOLN
1.5000 mL | INTRAMUSCULAR | Status: AC | PRN
  Administered 2018-07-07: 1.5 mL

## 2018-07-07 MED ORDER — HYALURONAN 30 MG/2ML IX SOSY
30.0000 mg | PREFILLED_SYRINGE | INTRA_ARTICULAR | Status: AC | PRN
  Administered 2018-07-07: 30 mg via INTRA_ARTICULAR

## 2018-07-07 NOTE — Progress Notes (Signed)
   Procedure Note  Patient: Brenda Gray             Date of Birth: [birth year]           MRN: [id number]             Visit Date: 07/07/2018  Procedures: Visit Diagnoses: Primary osteoarthritis of both knees - Plan: Large Joint Inj: bilateral knee Orthovisc #1 bilateral B/B Large Joint Inj: bilateral knee on 07/07/2018 9:14 AM Indications: pain Details: 25 G 1.5 in needle, medial approach  Arthrogram: No  Medications (Right): 30 mg Hyaluronan 30 MG/2ML; 1.5 mL lidocaine 1 % Aspirate (Right): 0 mL Medications (Left): 30 mg Hyaluronan 30 MG/2ML; 1.5 mL lidocaine 1 % Aspirate (Left): 0 mL Outcome: tolerated well, no immediate complications Procedure, treatment alternatives, risks and benefits explained, specific risks discussed. Consent was given by the patient. Immediately prior to procedure a time out was called to verify the correct patient, procedure, equipment, support staff and site/side marked as required. Patient was prepped and draped in the usual sterile fashion.     Patient tolerated the procedure well.  Hazel Sams, PA-C

## 2018-07-11 DIAGNOSIS — N952 Postmenopausal atrophic vaginitis: Secondary | ICD-10-CM | POA: Diagnosis not present

## 2018-07-11 DIAGNOSIS — N3010 Interstitial cystitis (chronic) without hematuria: Secondary | ICD-10-CM | POA: Diagnosis not present

## 2018-07-14 ENCOUNTER — Ambulatory Visit (INDEPENDENT_AMBULATORY_CARE_PROVIDER_SITE_OTHER): Payer: Medicare Other | Admitting: Physician Assistant

## 2018-07-14 DIAGNOSIS — M170 Bilateral primary osteoarthritis of knee: Secondary | ICD-10-CM

## 2018-07-14 MED ORDER — LIDOCAINE HCL 1 % IJ SOLN
1.5000 mL | INTRAMUSCULAR | Status: AC | PRN
  Administered 2018-07-14: 1.5 mL

## 2018-07-14 MED ORDER — HYALURONAN 30 MG/2ML IX SOSY
30.0000 mg | PREFILLED_SYRINGE | INTRA_ARTICULAR | Status: AC | PRN
  Administered 2018-07-14: 30 mg via INTRA_ARTICULAR

## 2018-07-14 NOTE — Progress Notes (Signed)
   Procedure Note  Patient: Brenda Gray             Date of Birth: 09-Jan-1961           MRN: [id number]             Visit Date: 07/14/2018  Procedures: Visit Diagnoses: Primary osteoarthritis of both knees - Plan: Large Joint Inj: bilateral knee orthovisc #2 bilateral B/B Large Joint Inj: bilateral knee on 07/14/2018 1:28 PM Indications: pain Details: 25 G 1.5 in needle, medial approach  Arthrogram: No  Medications (Right): 30 mg Hyaluronan 30 MG/2ML; 1.5 mL lidocaine 1 % Aspirate (Right): 0 mL Medications (Left): 30 mg Hyaluronan 30 MG/2ML; 1.5 mL lidocaine 1 % Aspirate (Left): 0 mL Outcome: tolerated well, no immediate complications Procedure, treatment alternatives, risks and benefits explained, specific risks discussed. Consent was given by the patient. Immediately prior to procedure a time out was called to verify the correct patient, procedure, equipment, support staff and site/side marked as required. Patient was prepped and draped in the usual sterile fashion.     Patient tolerated the procedure well.  Hazel Sams, PA-C

## 2018-07-16 NOTE — Progress Notes (Signed)
Referring Provider: Leanna Battles, MD Primary Care Physician:  Leanna Battles, MD   Reason for Consultation:  Hiatal hernia   IMPRESSION:  GERD +/- globus Abnormal esophagram 05/13/09    - delayed of barium tablet at the level of the hiatal hernia without obvious stricture or ring H pylori + gastric ulcers 2010 Prior tobacco use 2006 (10 pack year history) Normal screening colonoscopy 02/2009  I have personally reviewed her esophagram results. No obvious stricture or ring. Delay of barium may be related to hiatal hernia, which would not response to empiric dilation. Possible esophagitis, eosinophilic esophagitis, GERD related dysmotility, or functional symptoms. Some of her symptoms may be related to LPR. She does not meet the diagnostic criteria for globus despite the prominent symptom of fullness.  Recommend a trial of PPI and EGD with empiric dilation given her esophagram results.   PLAN: Avoid all NSAIDs Pantoprazole 40 mg daily EGD with possible balloon dilation Colonoscopy due 02/2010 Referral to ENT if EGD is nondiagnostic  I consented the patient at the bedside today discussing the risks, benefits, and alternatives to endoscopic evaluation. In particular, we discussed the risks that include, but are not limited to, reaction to medication, cardiopulmonary compromise, bleeding requiring blood transfusion, aspiration resulting in pneumonia, perforation requiring surgery, and even death. We reviewed the risk of missed lesion including polyps or even cancer. The patient acknowledges these risks and asks that we proceed.  HPI: Brenda Gray is a 57 y.o. female seen in consultation at the request of Dr. Sharlett Iles.  The history is obtained through the patient and review of her electronic health record. She has osteoporosis, degenerative disc disease, depression, hyperlipidemia, and distant tobacco use. History of cervical neck surgery many years ago.   Hospitalized for bleeding  gastric ulcers in 2011. She had an EGD with Dr. Deatra Ina 08/01/2010 for dysphasia abdominal pain that showed healing prepyloric ulcers, and irregular Z line, and she was dilated with an 18 mm Maloney with minimal resistance. Using Pepcid PRN since then with any heartburn feelings.  Constant sensation of phlegm in her throat x 1 year with ongoing sensation that she needs to clear her throat. Food lodging localized to the cervical esophagus. Able to cough up or regurgitation the food.  Findings the symptoms are occurring more frequently.  "Raspy" voice for more than a year. No neck pain. No odynophagia. No brash. Weight stable. Good appetite.   Feels "tight" on the left side but this has been present for more than a year and not related to her new symptoms. She previously attributed this to her cervical surgery.    Esophagram 05/13/2018 for feeling of fullness in the throat, dysphasia, and intermittent clearing of the throat showed a small hiatal hernia with gastroesophageal reflux, normal esophageal peristalsis, mild delay in the barium pill at the level of the hiatal hernia.  No esophageal narrowing was seen but this was suggested by the radiologist given the delay of the barium pill.  She feels like her symptoms may have worsened since the esophagram.  Taking small bites and drinking more water.  No PPI or H2Blocker therapy. No other associated symptoms. No identified exacerbating or relieving features. She denies the use of any NSAIDs.   Screening colonoscopy with Dr. Deatra Ina 03/09/2009 was normal a repeat colonoscopy was recommended in 10 years.    Past Medical History:  Diagnosis Date  . Atypical chest pain 06/04/2017  . Chronic headache S/P CERVICAL FUSION'S  . Cystitis, interstitial    frequency/urgency/nocturia  .  DDD (degenerative disc disease), cervical   . Dry eyes   . Frequency of urination   . Hyperlipidemia   . Mild acid reflux OCCASIONAL--  WATCHES DIET  . Nocturia   . Numbness s/p  cerival fusion   right side pain and numbness   . Pulmonary nodule noted in 2010   right upper lobe -- stable (monitored by pcp)  . Status post dilation of esophageal narrowing 2011  . Urge incontinence of urine     Past Surgical History:  Procedure Laterality Date  . CATARACT EXTRACTION, BILATERAL    . CERVICAL DISKECTOMY AND FUSION  05-14-2002   C5 - 7  . CYSTO WITH HYDRODISTENSION  07/19/2011   Procedure: CYSTOSCOPY/HYDRODISTENSION;  Surgeon: Malka So;  Location: [facility];  Service: Urology;  Laterality: N/A;  CYSTOSCOPY WITH HYDORDISTENTION OF BLADDER AND INSTILLATION OF MARCAINE AND PYRIDIUM  . cysto/ hod  03-10-2009   [facility]  . CYSTOSCOPY N/A 05/01/2013   Procedure: CYSTOSCOPY;  Surgeon: Reece Packer, MD;  Location: [facility];  Service: Urology;  Laterality: N/A;  . CYSTOSCOPY WITH INJECTION  06/17/2012   Procedure: CYSTOSCOPY WITH INJECTION;  Surgeon: Reece Packer, MD;  Location: [place];  Service: Urology;  Laterality: N/A;  BOTOX  . CYSTOSCOPY WITH INJECTION N/A 12/08/2012   Procedure: CYSTOSCOPY WITH BOTOX INJECTION;  Surgeon: Reece Packer, MD;  Location: [place];  Service: Urology;  Laterality: N/A;  . laparoscopy ovarian cystectomy  2000 (APPROX)  . left rotator cuff repair  OCT 2009  . POSTERIOR FUSION CERVICAL SPINE  06-29-2009   C4 - 5  . PUBOVAGINAL SLING N/A 05/01/2013   Procedure:  Lowella Dell;  Surgeon: Reece Packer, MD;  Location: [facility];  Service: Urology;  Laterality: N/A;  . ROTATOR CUFF REPAIR  2001   RIGHT SHOULDER  . TONSILLECTOMY  1981  . VAGINAL HYSTERECTOMY  1992    Prior to Admission medications   Medication Sig Start Date End Date Taking? Authorizing Provider  ALPRAZolam Duanne Moron) 0.5 MG tablet alprazolam 0.5 mg tablet    [provider]  Calcium Carbonate-Vitamin D (CALCIUM 600 + D PO) Take 1 tablet by mouth 2 (two) times daily.     [provider]  Cetirizine HCl (ZYRTEC ALLERGY PO) Take by mouth.    [provider]  estradiol (ESTRACE) 0.1 MG/GM vaginal cream U 0.25 GRAM VAGINALLY 2 TIMES PER WK 05/29/18   [provider]  Ginger, Zingiber officinalis, (GINGER PO) Take by mouth.    [provider]  Multiple Vitamin (MULTIVITAMIN) tablet Take 1 tablet by mouth daily.     [provider]  Omega-3 Fatty Acids (FISH OIL) 1000 MG CAPS Take by mouth.    [provider]  pravastatin (PRAVACHOL) 20 MG tablet Take 20 mg by mouth every evening.     [provider]  Probiotic Product (PROBIOTIC DAILY PO) Take 1 tablet by mouth daily.     [provider]  tiZANidine (ZANAFLEX) 4 MG tablet TAKE 1 TABLET BY MOUTH EVERY NIGHT AT BEDTIME AS NEEDED 06/19/18   Ofilia Neas, PA-C  traMADol (ULTRAM) 50 MG tablet Take 50 mg by mouth every 6 (six) hours as needed for pain.    [provider]  TURMERIC PO Take by mouth.    [provider]    Current Outpatient Medications  Medication Sig Dispense Refill  . ALPRAZolam (XANAX) 0.5 MG tablet alprazolam  0.5 mg tablet    . Calcium Carbonate-Vitamin D (CALCIUM 600 + D PO) Take 1 tablet by mouth 2 (two) times daily.    . Cetirizine HCl (ZYRTEC ALLERGY PO) Take by mouth.    . estradiol (ESTRACE) 0.1 MG/GM vaginal cream U 0.25 GRAM VAGINALLY 2 TIMES PER WK  6  . Ginger, Zingiber officinalis, (GINGER PO) Take by mouth.    . Multiple Vitamin (MULTIVITAMIN) tablet Take 1 tablet by mouth daily.     . Omega-3 Fatty Acids (FISH OIL) 1000 MG CAPS Take by mouth.    . pravastatin (PRAVACHOL) 20 MG tablet Take 20 mg by mouth every evening.     . Probiotic Product (PROBIOTIC DAILY PO) Take 1 tablet by mouth daily.     Marland Kitchen tiZANidine (ZANAFLEX) 4 MG tablet TAKE 1 TABLET BY MOUTH EVERY NIGHT AT BEDTIME AS NEEDED 30 tablet 2  . traMADol (ULTRAM) 50 MG tablet Take 50 mg by mouth every 6 (six) hours as needed for pain.    .  TURMERIC PO Take by mouth.     Current Facility-Administered Medications  Medication Dose Route Frequency Provider Last Rate Last Dose  . zoledronic acid (RECLAST) injection 5 mg  5 mg Intravenous Once Bo Merino, MD        Allergies as of 07/18/2018 - Review Complete 06/19/2018  Allergen Reaction Noted  . Cymbalta [duloxetine hcl]  11/05/2016  . Lyrica [pregabalin] Itching 11/05/2016    Family History  Problem Relation Age of Onset  . Hypertension Mother   . Heart failure Mother   . Heart disease Brother   . Stroke Maternal Grandmother   . Hypertension Brother   . Heart disease Brother   . Healthy Son     Social History   Socioeconomic History  . Marital status: Married    Spouse name: Not on file  . Number of children: Not on file  . Years of education: Not on file  . Highest education level: Not on file  Occupational History  . Not on file  Social Needs  . Financial resource strain: Not on file  . Food insecurity:    Worry: Not on file    Inability: Not on file  . Transportation needs:    Medical: Not on file    Non-medical: Not on file  Tobacco Use  . Smoking status: Former Smoker    Types: Cigarettes    Last attempt to quit: 07/16/2006    Years since quitting: 12.0  . Smokeless tobacco: Never Used  Substance and Sexual Activity  . Alcohol use: Yes    Comment: rare  . Drug use: No  . Sexual activity: Not on file  Lifestyle  . Physical activity:    Days per week: Not on file    Minutes per session: Not on file  . Stress: Not on file  Relationships  . Social connections:    Talks on phone: Not on file    Gets together: Not on file    Attends religious service: Not on file    Active member of club or organization: Not on file    Attends meetings of clubs or organizations: Not on file    Relationship status: Not on file  . Intimate partner violence:    Fear of current or ex partner: Not on file    Emotionally abused: Not on file    Physically  abused: Not on file    Forced sexual activity: Not on file  Other Topics  Concern  . Not on file  Social History Narrative  . Not on file    Review of Systems: 12 system ROS is negative except as noted above except for allergies, arthritis, back pain.   Physical Exam: Vital signs were reviewed. General:   Alert, well-nourished, pleasant and cooperative in NAD Head:  Normocephalic and atraumatic. Eyes:  Sclera clear, no icterus.   Conjunctiva pink. Mouth:  No deformity or lesions.   Neck:  Supple; symmetric. no thyromegaly. No tracheal deviation. No cervical LAD.  Lungs:  Clear throughout to auscultation.   No wheezes.  Heart:  Regular rate and rhythm; no murmurs Abdomen:  Soft, nontender, normal bowel sounds. No rebound or guarding. No hepatosplenomegaly Rectal:  Deferred  Msk:  Symmetrical without gross deformities. Extremities:  No gross deformities or edema. Neurologic:  Alert and  oriented x4;  grossly nonfocal Skin:  No rash or bruise. Psych:  Alert and cooperative. Normal mood and affect.   Markon Jares L. Tarri Glenn Md, MPH [facility] 07/16/2018, 8:47 AM

## 2018-07-18 ENCOUNTER — Ambulatory Visit (INDEPENDENT_AMBULATORY_CARE_PROVIDER_SITE_OTHER): Payer: Medicare Other | Admitting: Gastroenterology

## 2018-07-18 ENCOUNTER — Encounter: Payer: Self-pay | Admitting: Gastroenterology

## 2018-07-18 VITALS — BP 118/70 | HR 78 | Ht 64.5 in | Wt 136.0 lb

## 2018-07-18 DIAGNOSIS — K219 Gastro-esophageal reflux disease without esophagitis: Secondary | ICD-10-CM

## 2018-07-18 DIAGNOSIS — R933 Abnormal findings on diagnostic imaging of other parts of digestive tract: Secondary | ICD-10-CM

## 2018-07-18 MED ORDER — PANTOPRAZOLE SODIUM 40 MG PO TBEC: 40.0000 mg | DELAYED_RELEASE_TABLET | Freq: Every day | ORAL | 6 refills | Status: DC

## 2018-07-18 NOTE — Patient Instructions (Signed)
Avoid NSAIDS  We have sent the following medications to your pharmacy for you to pick up at your convenience:  Protonix  You have been scheduled for an endoscopy. Please follow written instructions given to you at your visit today. If you use inhalers (even only as needed), please bring them with you on the day of your procedure. Your physician has requested that you go to www.startemmi.com and enter the access code given to you at your visit today. This web site gives a general overview about your procedure. However, you should still follow specific instructions given to you by our office regarding your preparation for the procedure.  You are due for a colonoscopy next year.Marland Kitchen

## 2018-07-21 ENCOUNTER — Ambulatory Visit (INDEPENDENT_AMBULATORY_CARE_PROVIDER_SITE_OTHER): Payer: Medicare Other | Admitting: Physician Assistant

## 2018-07-21 DIAGNOSIS — M170 Bilateral primary osteoarthritis of knee: Secondary | ICD-10-CM

## 2018-07-21 MED ORDER — HYALURONAN 30 MG/2ML IX SOSY
30.0000 mg | PREFILLED_SYRINGE | INTRA_ARTICULAR | Status: AC | PRN
  Administered 2018-07-21: 30 mg via INTRA_ARTICULAR

## 2018-07-21 MED ORDER — LIDOCAINE HCL 1 % IJ SOLN
1.5000 mL | INTRAMUSCULAR | Status: AC | PRN
  Administered 2018-07-21: 1.5 mL

## 2018-07-21 NOTE — Progress Notes (Signed)
   Procedure Note  Patient: Brenda Gray             Date of Birth: 1961/03/26           MRN: [id number]             Visit Date: 07/21/2018  Procedures: Visit Diagnoses: Primary osteoarthritis of both knees - Plan: Large Joint Inj: bilateral knee orthovisc #3 bilateral B/B Large Joint Inj: bilateral knee on 07/21/2018 11:52 AM Indications: pain Details: 25 G 1.5 in needle, medial approach  Arthrogram: No  Medications (Right): 30 mg Hyaluronan 30 MG/2ML; 1.5 mL lidocaine 1 % Aspirate (Right): 0 mL Medications (Left): 30 mg Hyaluronan 30 MG/2ML; 1.5 mL lidocaine 1 % Aspirate (Left): 0 mL Outcome: tolerated well, no immediate complications Procedure, treatment alternatives, risks and benefits explained, specific risks discussed. Consent was given by the patient. Immediately prior to procedure a time out was called to verify the correct patient, procedure, equipment, support staff and site/side marked as required. Patient was prepped and draped in the usual sterile fashion.     Patient tolerated the procedure well.  Hazel Sams, PA-C

## 2018-07-29 ENCOUNTER — Encounter: Payer: Medicare Other | Admitting: Internal Medicine

## 2018-08-05 ENCOUNTER — Encounter: Payer: Self-pay | Admitting: Gastroenterology

## 2018-08-05 ENCOUNTER — Ambulatory Visit (AMBULATORY_SURGERY_CENTER): Payer: Medicare Other | Admitting: Gastroenterology

## 2018-08-05 VITALS — BP 125/63 | HR 74 | Temp 97.7°F | Resp 16 | Ht 64.5 in | Wt 136.0 lb

## 2018-08-05 DIAGNOSIS — B9681 Helicobacter pylori [H. pylori] as the cause of diseases classified elsewhere: Secondary | ICD-10-CM | POA: Diagnosis not present

## 2018-08-05 DIAGNOSIS — K3189 Other diseases of stomach and duodenum: Secondary | ICD-10-CM

## 2018-08-05 DIAGNOSIS — K219 Gastro-esophageal reflux disease without esophagitis: Secondary | ICD-10-CM | POA: Diagnosis not present

## 2018-08-05 DIAGNOSIS — K228 Other specified diseases of esophagus: Secondary | ICD-10-CM | POA: Diagnosis not present

## 2018-08-05 DIAGNOSIS — K2970 Gastritis, unspecified, without bleeding: Secondary | ICD-10-CM | POA: Diagnosis not present

## 2018-08-05 DIAGNOSIS — K2950 Unspecified chronic gastritis without bleeding: Secondary | ICD-10-CM | POA: Diagnosis not present

## 2018-08-05 MED ORDER — SODIUM CHLORIDE 0.9 % IV SOLN: 500.0000 mL | Freq: Once | INTRAVENOUS | Status: DC

## 2018-08-05 NOTE — Patient Instructions (Signed)
Return to clinic in 2-4 weeks. Dr Tarri Glenn' nurse will call you tomorrow or next week.   YOU HAD AN ENDOSCOPIC PROCEDURE TODAY AT [facility]:   Refer to the procedure report that was given to you for any specific questions about what was found during the examination.  If the procedure report does not answer your questions, please call your gastroenterologist to clarify.  If you requested that your care partner not be given the details of your procedure findings, then the procedure report has been included in a sealed envelope for you to review at your convenience later.  YOU SHOULD EXPECT: Some feelings of bloating in the abdomen. Passage of more gas than usual.  Walking can help get rid of the air that was put into your GI tract during the procedure and reduce the bloating. If you had a lower endoscopy (such as a colonoscopy or flexible sigmoidoscopy) you may notice spotting of blood in your stool or on the toilet paper. If you underwent a bowel prep for your procedure, you may not have a normal bowel movement for a few days.  Please Note:  You might notice some irritation and congestion in your nose or some drainage.  This is from the oxygen used during your procedure.  There is no need for concern and it should clear up in a day or so.  SYMPTOMS TO REPORT IMMEDIATELY:    Following upper endoscopy (EGD)  Vomiting of blood or coffee ground material  New chest pain or pain under the shoulder blades  Painful or persistently difficult swallowing  New shortness of breath  Fever of 100F or higher  Black, tarry-looking stools  For urgent or emergent issues, a gastroenterologist can be reached at any hour by calling [phone].   DIET:  We do recommend a small meal at first, but then you may proceed to your regular diet.  Drink plenty of fluids but you should avoid alcoholic beverages for 24 hours.  ACTIVITY:  You should plan to take it easy for the rest of today and you  should NOT DRIVE or use heavy machinery until tomorrow (because of the sedation medicines used during the test).    FOLLOW UP: Our staff will call the number listed on your records the next business day following your procedure to check on you and address any questions or concerns that you may have regarding the information given to you following your procedure. If we do not reach you, we will leave a message.  However, if you are feeling well and you are not experiencing any problems, there is no need to return our call.  We will assume that you have returned to your regular daily activities without incident.  If any biopsies were taken you will be contacted by phone or by letter within the next 1-3 weeks.  Please call us at [phone] if you have not heard about the biopsies in 3 weeks.    SIGNATURES/CONFIDENTIALITY: You and/or your care partner have signed paperwork which will be entered into your electronic medical record.  These signatures attest to the fact that that the information above on your After Visit Summary has been reviewed and is understood.  Full responsibility of the confidentiality of this discharge information lies with you and/or your care-partner.

## 2018-08-05 NOTE — Progress Notes (Signed)
Called to room to assist during endoscopic procedure.  Patient ID and intended procedure confirmed with present staff. Received instructions for my participation in the procedure from the performing physician.  

## 2018-08-05 NOTE — Progress Notes (Signed)
PT taken to PACU. Monitors in place. VSS. Report given to RN. 

## 2018-08-05 NOTE — Op Note (Signed)
[facility] Patient Name: Brenda Gray Procedure Date: 08/05/2018 11:23 AM MRN: [id number] Endoscopist: Thornton Park MD, MD Age: 57 Referring MD:  Date of Birth: Mar 21, 1961 Gender: Female Account #: [id number] Procedure:                Upper GI endoscopy Indications:              Suspected gastro-esophageal reflux disease,                            Abnormal abdominal x-ray of the GI tract Medicines:                See the Anesthesia note for documentation of the                            administered medications Procedure:                Pre-Anesthesia Assessment:                           - Prior to the procedure, a History and Physical                            was performed, and patient medications and                            allergies were reviewed. The patient's tolerance of                            previous anesthesia was also reviewed. The risks                            and benefits of the procedure and the sedation                            options and risks were discussed with the patient.                            All questions were answered, and informed consent                            was obtained. Prior Anticoagulants: The patient has                            taken no previous anticoagulant or antiplatelet                            agents. ASA Grade Assessment: II - A patient with                            mild systemic disease. After reviewing the risks                            and benefits, the patient was deemed in  satisfactory condition to undergo the procedure.                           After obtaining informed consent, the endoscope was                            passed under direct vision. Throughout the                            procedure, the patient's blood pressure, pulse, and                            oxygen saturations were monitored continuously. The                            Model  [id number]-[id number]) scope was introduced                            through the mouth, and advanced to the third part                            of duodenum. The upper GI endoscopy was                            accomplished without difficulty. The patient                            tolerated the procedure well. Scope In: Scope Out: Findings:                 The examined esophagus was normal. Biopsies were                            taken with a cold forceps for histology. No ring,                            web, stricture, or ringed esophagus.                           Diffuse mildly erythematous mucosa without bleeding                            was found in the gastric body. Biopsies were taken                            with a cold forceps for histology.                           The examined duodenum was normal. Complications:            No immediate complications. Estimated Blood Loss:     Estimated blood loss: none. Impression:               - Normal esophagus. Biopsied.                           -  Erythematous mucosa in the gastric body. Biopsied.                           - Normal examined duodenum. Recommendation:           - Await pathology results.                           - Resume regular diet today.                           - Continue present medications.                           - Return to GI clinic in 2-4 weeks. Thornton Park MD, MD 08/05/2018 11:42:25 AM This report has been signed electronically.

## 2018-08-06 ENCOUNTER — Telehealth: Payer: Self-pay | Admitting: *Deleted

## 2018-08-06 NOTE — Telephone Encounter (Signed)
  Follow up Call-  Call back number 08/05/2018  Post procedure Call Back phone  # [phone]  Permission to leave phone message Yes  Some recent data might be hidden     Patient questions:  Do you have a fever, pain , or abdominal swelling? No. Pain Score  0 *  Have you tolerated food without any problems? Yes.    Have you been able to return to your normal activities? Yes.    Do you have any questions about your discharge instructions: Diet   No. Medications  No. Follow up visit  No.  Do you have questions or concerns about your Care? No.  Actions: * If pain score is 4 or above: No action needed, pain <4.

## 2018-08-06 NOTE — Telephone Encounter (Signed)
  Follow up Call-  Call back number 08/05/2018  Post procedure Call Back phone  # [phone]  Permission to leave phone message Yes  Some recent data might be hidden    [facility]

## 2018-08-12 ENCOUNTER — Other Ambulatory Visit: Payer: Self-pay

## 2018-08-12 ENCOUNTER — Encounter: Payer: Self-pay | Admitting: Gastroenterology

## 2018-08-12 DIAGNOSIS — A048 Other specified bacterial intestinal infections: Secondary | ICD-10-CM

## 2018-08-12 MED ORDER — CLARITHROMYCIN 500 MG PO TABS: 500.0000 mg | ORAL_TABLET | Freq: Two times a day (BID) | ORAL | 0 refills | Status: AC

## 2018-08-12 MED ORDER — AZITHROMYCIN 500 MG PO TABS: 1000.0000 mg | ORAL_TABLET | Freq: Every day | ORAL | 0 refills | Status: DC

## 2018-08-13 ENCOUNTER — Telehealth: Payer: Self-pay | Admitting: Gastroenterology

## 2018-08-13 MED ORDER — AMOXICILLIN 500 MG PO TABS: 1000.0000 mg | ORAL_TABLET | Freq: Two times a day (BID) | ORAL | 0 refills | Status: DC

## 2018-08-13 NOTE — Telephone Encounter (Signed)
Pharmacist at [facility] wanted to clarify Rx is usually Amoxicillin and Clarithromycin not azithromycin because they are in the same drug class. Please advise?

## 2018-08-13 NOTE — Telephone Encounter (Signed)
I spoke with the pharmacy verbal order given to D/C azithromycin and new rx for amoxicillin

## 2018-08-13 NOTE — Telephone Encounter (Signed)
Thank you for asking. It should be amoxicillin 1 g BID x 10 days, clarithromycin 500 mg BID x 10 days, and PPI BID x 10 days, then decrease the PPI to once daily. Thank you.

## 2018-08-13 NOTE — Telephone Encounter (Signed)
Dr. Tarri Glenn please clarify.  Pharmacy is questioning the orders   . Take azithromycin 1 gram once a day for 10 days  2. Take Clarithromycin 500 mg 2xd for 10 days  3. Continue PPI BID x 10 days, then decrease to once daily

## 2018-08-21 DIAGNOSIS — D2230 Melanocytic nevi of unspecified part of face: Secondary | ICD-10-CM | POA: Diagnosis not present

## 2018-08-21 DIAGNOSIS — D225 Melanocytic nevi of trunk: Secondary | ICD-10-CM | POA: Diagnosis not present

## 2018-08-21 DIAGNOSIS — L821 Other seborrheic keratosis: Secondary | ICD-10-CM | POA: Diagnosis not present

## 2018-08-21 DIAGNOSIS — Z85828 Personal history of other malignant neoplasm of skin: Secondary | ICD-10-CM | POA: Diagnosis not present

## 2018-08-21 DIAGNOSIS — D2272 Melanocytic nevi of left lower limb, including hip: Secondary | ICD-10-CM | POA: Diagnosis not present

## 2018-08-21 DIAGNOSIS — L723 Sebaceous cyst: Secondary | ICD-10-CM | POA: Diagnosis not present

## 2018-08-21 DIAGNOSIS — Z23 Encounter for immunization: Secondary | ICD-10-CM | POA: Diagnosis not present

## 2018-08-21 DIAGNOSIS — D2271 Melanocytic nevi of right lower limb, including hip: Secondary | ICD-10-CM | POA: Diagnosis not present

## 2018-08-21 DIAGNOSIS — L570 Actinic keratosis: Secondary | ICD-10-CM | POA: Diagnosis not present

## 2018-08-26 ENCOUNTER — Ambulatory Visit: Payer: Medicare Other | Admitting: Gastroenterology

## 2018-09-08 ENCOUNTER — Encounter: Payer: Self-pay | Admitting: Rheumatology

## 2018-09-08 DIAGNOSIS — Z8262 Family history of osteoporosis: Secondary | ICD-10-CM | POA: Diagnosis not present

## 2018-09-08 DIAGNOSIS — M8589 Other specified disorders of bone density and structure, multiple sites: Secondary | ICD-10-CM | POA: Diagnosis not present

## 2018-09-08 DIAGNOSIS — Z1231 Encounter for screening mammogram for malignant neoplasm of breast: Secondary | ICD-10-CM | POA: Diagnosis not present

## 2018-09-08 DIAGNOSIS — Z90710 Acquired absence of both cervix and uterus: Secondary | ICD-10-CM | POA: Diagnosis not present

## 2018-09-18 NOTE — Progress Notes (Addendum)
Referring Provider: Leanna Battles, MD Primary Care Physician:  Leanna Battles, MD   Chief Complaint:  Hiatal hernia   IMPRESSION:  H pylori gastritis 08/05/18    - treated with azithromycin, clarithromycin, and pantoprazole    - H pylori + gastric ulcers 2010, bleeding 2011 GERD +/- globus Abnormal esophagram 05/13/18    - delayed of barium tablet at the level of the hiatal hernia without obvious stricture or ring on EGD 08/05/18 History of dysphagia    - empiric dilation by Dr. Deatra Ina 08/01/10 with an 24mm [place] Constipation Prior tobacco use 2006 (10 pack year history) Normal screening colonoscopy 02/2009    - colonoscopy due 02/2019   PLAN: Avoid all NSAIDs Continue Pantoprazole 40 mg daily Trial of Amitiza 24 mcg BID Increase water in the diet  Colonoscopy due 02/2019 Referral to ENT if symptoms persist or worsen Return to this clinic in 2-3 months   HPI: PHOENIX RIESEN is a 58 y.o. female seen in follow-up after her initial consultation 07/18/2018.  The interval history is obtained through the patient and review of her electronic health record. An upper endoscopy 08/05/2018 revealed a normal esophagus, H. pylori gastritis, and a normal-appearing duodenum.  No esophageal dilation was performed.  Esophageal biopsies were normal.  She was treated with 10 days of azithromycin, clarithromycin, and pantoprazole.   Continues to report throat clearing with constant sensation of phlegm in her throat . No dysphagia or odynophagia. No additional food lodging.  Constant hunger or epigastric "pit" at all times. Eating relieves the pit but results in bloating. If busy, she is not aware of these symptoms. Pantroprazole daily has not provided any symptom relief. Will take a Pepcid Complete PRN for heartburn. Possibly triggered over the holiday. Uses probiotic daiy. Weight remains stable. Good appetite.   Struggling with fiber supplements daily to have a daily BM. Worsened over the  last few years.   Esophagram 05/13/2018 for feeling of fullness in the throat, dysphasia, and intermittent clearing of the throat showed a small hiatal hernia with gastroesophageal reflux, normal esophageal peristalsis, mild delay in the barium pill at the level of the hiatal hernia.  No esophageal narrowing was seen but this was suggested by the radiologist given the delay of the barium pill.    Past Medical History:  Diagnosis Date  . Arthritis   . Atypical chest pain 06/04/2017  . Chronic headache S/P CERVICAL FUSION'S  . Cystitis, interstitial    frequency/urgency/nocturia  . DDD (degenerative disc disease), cervical   . Dry eyes   . Frequency of urination   . H/O: upper GI bleed   . Hyperlipidemia   . Mild acid reflux OCCASIONAL--  WATCHES DIET  . Nocturia   . Numbness s/p cerival fusion   right side pain and numbness   . Pulmonary nodule noted in 2010   right upper lobe -- stable (monitored by pcp)  . Status post dilation of esophageal narrowing 2011  . Urge incontinence of urine     Past Surgical History:  Procedure Laterality Date  . CATARACT EXTRACTION, BILATERAL  07/2017   with lens implants  . CERVICAL DISKECTOMY AND FUSION  05-14-2002   C5 - 7  . CYSTO WITH HYDRODISTENSION  07/19/2011   Procedure: CYSTOSCOPY/HYDRODISTENSION;  Surgeon: Malka So;  Location: [facility];  Service: Urology;  Laterality: N/A;  CYSTOSCOPY WITH HYDORDISTENTION OF BLADDER AND INSTILLATION OF MARCAINE AND PYRIDIUM  . cysto/ hod  03-10-2009   [facility]  .  CYSTOSCOPY N/A 05/01/2013   Procedure: CYSTOSCOPY;  Surgeon: Reece Packer, MD;  Location: [facility];  Service: Urology;  Laterality: N/A;  . CYSTOSCOPY WITH INJECTION  06/17/2012   Procedure: CYSTOSCOPY WITH INJECTION;  Surgeon: Reece Packer, MD;  Location: [place];  Service: Urology;  Laterality: N/A;  BOTOX  . CYSTOSCOPY WITH INJECTION N/A 12/08/2012   Procedure: CYSTOSCOPY WITH  BOTOX INJECTION;  Surgeon: Reece Packer, MD;  Location: [place];  Service: Urology;  Laterality: N/A;  . laparoscopy ovarian cystectomy  2000 (APPROX)  . left rotator cuff repair  OCT 2009  . POSTERIOR FUSION CERVICAL SPINE  06-29-2009   C4 - 5  . PUBOVAGINAL SLING N/A 05/01/2013   Procedure:  Lowella Dell;  Surgeon: Reece Packer, MD;  Location: [facility];  Service: Urology;  Laterality: N/A;  . ROTATOR CUFF REPAIR  2001   RIGHT SHOULDER  . TONSILLECTOMY  1981  . VAGINAL HYSTERECTOMY  1992    Prior to Admission medications   Medication Sig Start Date End Date Taking? Authorizing Provider  ALPRAZolam Duanne Moron) 0.5 MG tablet alprazolam 0.5 mg tablet    [provider]  Calcium Carbonate-Vitamin D (CALCIUM 600 + D PO) Take 1 tablet by mouth 2 (two) times daily.    [provider]  Cetirizine HCl (ZYRTEC ALLERGY PO) Take by mouth.    [provider]  estradiol (ESTRACE) 0.1 MG/GM vaginal cream U 0.25 GRAM VAGINALLY 2 TIMES PER WK 05/29/18   [provider]  Ginger, Zingiber officinalis, (GINGER PO) Take by mouth.    [provider]  Multiple Vitamin (MULTIVITAMIN) tablet Take 1 tablet by mouth daily.     [provider]  Omega-3 Fatty Acids (FISH OIL) 1000 MG CAPS Take by mouth.    [provider]  pravastatin (PRAVACHOL) 20 MG tablet Take 20 mg by mouth every evening.     [provider]  Probiotic Product (PROBIOTIC DAILY PO) Take 1 tablet by mouth daily.     [provider]  tiZANidine (ZANAFLEX) 4 MG tablet TAKE 1 TABLET BY MOUTH EVERY NIGHT AT BEDTIME AS NEEDED 06/19/18   Ofilia Neas, PA-C  traMADol (ULTRAM) 50 MG tablet Take 50 mg by mouth every 6 (six) hours as needed for pain.    [provider]  TURMERIC PO Take by mouth.    [provider]    Current Outpatient Medications  Medication Sig Dispense Refill  . ALPRAZolam (XANAX) 0.5 MG  tablet alprazolam 0.5 mg tablet    . Calcium Carbonate-Vitamin D (CALCIUM 600 + D PO) Take 1 tablet by mouth 2 (two) times daily.    . Cetirizine HCl (ZYRTEC ALLERGY PO) Take 1 tablet by mouth daily.     Marland Kitchen estradiol (ESTRACE) 0.1 MG/GM vaginal cream as needed.   6  . Ginger, Zingiber officinalis, (GINGER PO) Take 1 tablet by mouth daily.     . Multiple Vitamin (MULTIVITAMIN) tablet Take 1 tablet by mouth daily.     . Omega-3 Fatty Acids (FISH OIL) 1000 MG CAPS Take by mouth.    . pantoprazole (PROTONIX) 40 MG tablet Take 1 tablet (40 mg total) by mouth daily. 30 tablet 6  . pravastatin (PRAVACHOL) 20 MG tablet Take 20 mg by mouth every evening.     . Probiotic Product (PROBIOTIC DAILY PO) Take 1 tablet by mouth daily.     Marland Kitchen tiZANidine (ZANAFLEX) 4 MG tablet TAKE 1  TABLET BY MOUTH EVERY NIGHT AT BEDTIME AS NEEDED 30 tablet 2  . traMADol (ULTRAM) 50 MG tablet Take 50 mg by mouth every 6 (six) hours as needed for pain.    . TURMERIC PO Take 1 tablet by mouth daily.     Marland Kitchen linaclotide (LINZESS) 72 MCG capsule Take 1 capsule (72 mcg total) by mouth daily before breakfast. 30 capsule 3   Current Facility-Administered Medications  Medication Dose Route Frequency Provider Last Rate Last Dose  . zoledronic acid (RECLAST) injection 5 mg  5 mg Intravenous Once Bo Merino, MD        Allergies as of 09/19/2018 - Review Complete 09/19/2018  Allergen Reaction Noted  . Cymbalta [duloxetine hcl]  11/05/2016  . Lyrica [pregabalin] Itching 11/05/2016    Family History  Problem Relation Age of Onset  . Hypertension Mother   . Heart failure Mother   . Colon polyps Mother   . Other Father        had to have esogasus dilated  . Heart disease Brother   . Stroke Maternal Grandmother   . Hypertension Brother   . Heart disease Brother   . Healthy Son   . Esophageal cancer Neg Hx   . Colon cancer Neg Hx   . Rectal cancer Neg Hx   . Stomach cancer Neg Hx     Social History   Socioeconomic  History  . Marital status: Married    Spouse name: Not on file  . Number of children: 1  . Years of education: Not on file  . Highest education level: Not on file  Occupational History  . Occupation: retired  Scientific laboratory technician  . Financial resource strain: Not on file  . Food insecurity:    Worry: Not on file    Inability: Not on file  . Transportation needs:    Medical: Not on file    Non-medical: Not on file  Tobacco Use  . Smoking status: Former Smoker    Years: 20.00    Types: Cigarettes    Last attempt to quit: 07/16/2006    Years since quitting: 12.1  . Smokeless tobacco: Never Used  Substance and Sexual Activity  . Alcohol use: Yes    Comment: rare  . Drug use: No  . Sexual activity: Not on file  Lifestyle  . Physical activity:    Days per week: Not on file    Minutes per session: Not on file  . Stress: Not on file  Relationships  . Social connections:    Talks on phone: Not on file    Gets together: Not on file    Attends religious service: Not on file    Active member of club or organization: Not on file    Attends meetings of clubs or organizations: Not on file    Relationship status: Not on file  . Intimate partner violence:    Fear of current or ex partner: Not on file    Emotionally abused: Not on file    Physically abused: Not on file    Forced sexual activity: Not on file  Other Topics Concern  . Not on file  Social History Narrative  . Not on file    Review of Systems: 12 system ROS is negative except as noted above except for allergies, arthritis, back pain.   Physical Exam: Vital signs were reviewed. General:   Alert, well-nourished, pleasant and cooperative in NAD Head:  Normocephalic and atraumatic. Eyes:  Sclera clear, no  icterus.   Conjunctiva pink. Mouth:  No deformity or lesions.   Neck:  Supple; symmetric. no thyromegaly. No tracheal deviation. No cervical LAD.  Lungs:  Clear throughout to auscultation.   No wheezes.  Heart:  Regular  rate and rhythm; no murmurs Abdomen:  Soft, nontender, normal bowel sounds. No rebound or guarding. No hepatosplenomegaly Rectal:  Deferred  Msk:  Symmetrical without gross deformities. Extremities:  No gross deformities or edema. Neurologic:  Alert and  oriented x4;  grossly nonfocal Skin:  No rash or bruise. Psych:  Alert and cooperative. Normal mood and affect.   Blessen Kimbrough L. Tarri Glenn Md, MPH [facility] 09/23/2018, 12:26 PM

## 2018-09-19 ENCOUNTER — Telehealth: Payer: Self-pay | Admitting: Gastroenterology

## 2018-09-19 ENCOUNTER — Ambulatory Visit (INDEPENDENT_AMBULATORY_CARE_PROVIDER_SITE_OTHER): Payer: Medicare Other | Admitting: Gastroenterology

## 2018-09-19 ENCOUNTER — Encounter: Payer: Self-pay | Admitting: Gastroenterology

## 2018-09-19 ENCOUNTER — Encounter (INDEPENDENT_AMBULATORY_CARE_PROVIDER_SITE_OTHER): Payer: Self-pay | Admitting: Physician Assistant

## 2018-09-19 ENCOUNTER — Ambulatory Visit (INDEPENDENT_AMBULATORY_CARE_PROVIDER_SITE_OTHER): Payer: Medicare Other | Admitting: Physician Assistant

## 2018-09-19 VITALS — Ht 64.0 in | Wt 141.0 lb

## 2018-09-19 VITALS — BP 112/70 | HR 72 | Ht 64.5 in | Wt 141.2 lb

## 2018-09-19 DIAGNOSIS — K5900 Constipation, unspecified: Secondary | ICD-10-CM

## 2018-09-19 DIAGNOSIS — G5761 Lesion of plantar nerve, right lower limb: Secondary | ICD-10-CM | POA: Diagnosis not present

## 2018-09-19 DIAGNOSIS — K219 Gastro-esophageal reflux disease without esophagitis: Secondary | ICD-10-CM | POA: Diagnosis not present

## 2018-09-19 MED ORDER — LUBIPROSTONE 24 MCG PO CAPS: 24.0000 ug | ORAL_CAPSULE | Freq: Two times a day (BID) | ORAL | 3 refills | Status: DC

## 2018-09-19 MED ORDER — LINACLOTIDE 72 MCG PO CAPS: 72.0000 ug | ORAL_CAPSULE | Freq: Every day | ORAL | 3 refills | Status: DC

## 2018-09-19 MED ORDER — METHYLPREDNISOLONE ACETATE 40 MG/ML IJ SUSP
40.0000 mg | INTRAMUSCULAR | Status: AC | PRN
  Administered 2018-09-19: 40 mg

## 2018-09-19 MED ORDER — LIDOCAINE HCL 1 % IJ SOLN
1.0000 mL | INTRAMUSCULAR | Status: AC | PRN
  Administered 2018-09-19: 1 mL

## 2018-09-19 NOTE — Telephone Encounter (Signed)
Pt called to let you know that Amitiza is $400 and she won't be able to afford it.

## 2018-09-19 NOTE — Telephone Encounter (Signed)
Pt called to inform that Linzess is also over $450.

## 2018-09-19 NOTE — Progress Notes (Signed)
Office Visit Note   Patient: Brenda Gray           Date of Birth: July 06, 1961           MRN: [id number] Visit Date: 09/19/2018              Requested by: Leanna Battles, [address] [place], [place] [address] PCP: Leanna Battles, MD  Chief Complaint  Patient presents with  . Right Foot - Pain    11/2016 s/p injections morton's neuroma       HPI: The patient is a 58 year old woman who comes in today with recurrent pain from her Morton's neuroma in her right foot.  She reports that remotely she had a leaf blower fall on the foot and this caused significant bruising and swelling and pain.  She has compensated for this by wearing sandals most of the year and also going barefoot as any close shoes tends to make her symptoms worse.  She comes in today requesting a repeat injection with steroids.  She reports that the previous injections with steroids of help significantly.  Assessment & Plan: Visit Diagnoses:  1. Morton neuroma, right     Plan: The patient's right foot Morton's neuroma was injected with a combination of lidocaine and Depo-Medrol and she tolerated this well.  She is interested in some longer term solutions for her Morton's neuroma and will follow-up with Dr. Sharol Given in several weeks to discuss operative options.  Follow-Up Instructions: Return in about 3 weeks (around 10/10/2018).   Ortho Exam  Patient is alert, oriented, no adenopathy, well-dressed, normal affect, normal respiratory effort. Patient has good pedal pulses.  She ambulates with mildly antalgic gait.  She is very tender to palpation over the webspace between the third and fourth toes.  Lateral compression also mimics her pain.  Imaging: No results found. No images are attached to the encounter.  Labs: No results found for: HGBA1C, ESRSEDRATE, CRP, LABURIC, REPTSTATUS, GRAMSTAIN, CULT, LABORGA   Lab Results  Component Value Date   ALBUMIN 4.3 11/05/2016    Body mass index is 24.2  kg/m.  Orders:  No orders of the defined types were placed in this encounter.  No orders of the defined types were placed in this encounter.    Procedures: Foot Inj Date/Time: 09/19/2018 1:48 PM Performed by: Milas Gain, PA-C Authorized by: Milas Gain, PA-C   Consent Given by:  Patient Condition: Morton's Neuroma   Location:  R foot Prep: patient was prepped and draped in usual sterile fashion   Needle Size:  22 G Approach:  Dorsal Medications:  1 mL lidocaine 1 %; 40 mg methylPREDNISolone acetate 40 MG/ML    Clinical Data: No additional findings.  ROS:  All other systems negative, except as noted in the HPI. Review of Systems  Objective: Vital Signs: Ht 5\' 4"  (1.626 m)   Wt 141 lb (64 kg)   BMI 24.20 kg/m   Specialty Comments:  No specialty comments available.  PMFS History: Patient Active Problem List   Diagnosis Date Noted  . Atypical chest pain 06/04/2017  . S/P rotator cuff repair 11/04/2016  . Dyslipidemia 11/04/2016  . History of peptic ulcer disease 11/04/2016  . Interstitial cystitis 11/04/2016  . DJD (degenerative joint disease), cervical 10/29/2016  . Spondylosis of lumbar region without myelopathy or radiculopathy 10/29/2016  . Primary osteoarthritis of both knees 10/29/2016  . 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 Primary insomnia 10/29/2016  . History of recurrent  cystitis 10/29/2016  . Primary osteoarthritis of both hands 10/29/2016  . Morton neuroma, right 10/15/2016  . DYSPHAGIA UNSPECIFIED 07/31/2010  . ABDOMINAL PAIN, EPIGASTRIC 07/31/2010   Past Medical History:  Diagnosis Date  . Arthritis   . Atypical chest pain 06/04/2017  . Chronic headache S/P CERVICAL FUSION'S  . Cystitis, interstitial    frequency/urgency/nocturia  . DDD (degenerative disc disease), cervical   . Dry eyes   . Frequency of urination   . H/O: upper GI bleed   . Hyperlipidemia   . Mild acid reflux OCCASIONAL--   WATCHES DIET  . Nocturia   . Numbness s/p cerival fusion   right side pain and numbness   . Pulmonary nodule noted in 2010   right upper lobe -- stable (monitored by pcp)  . Status post dilation of esophageal narrowing 2011  . Urge incontinence of urine     Family History  Problem Relation Age of Onset  . Hypertension Mother   . Heart failure Mother   . Colon polyps Mother   . Other Father        had to have esogasus dilated  . Heart disease Brother   . Stroke Maternal Grandmother   . Hypertension Brother   . Heart disease Brother   . Healthy Son   . Esophageal cancer Neg Hx   . Colon cancer Neg Hx   . Rectal cancer Neg Hx   . Stomach cancer Neg Hx     Past Surgical History:  Procedure Laterality Date  . CATARACT EXTRACTION, BILATERAL  07/2017   with lens implants  . CERVICAL DISKECTOMY AND FUSION  05-14-2002   C5 - 7  . CYSTO WITH HYDRODISTENSION  07/19/2011   Procedure: CYSTOSCOPY/HYDRODISTENSION;  Surgeon: Malka So;  Location: [facility];  Service: Urology;  Laterality: N/A;  CYSTOSCOPY WITH HYDORDISTENTION OF BLADDER AND INSTILLATION OF MARCAINE AND PYRIDIUM  . cysto/ hod  03-10-2009   [facility]  . CYSTOSCOPY N/A 05/01/2013   Procedure: CYSTOSCOPY;  Surgeon: Reece Packer, MD;  Location: [facility];  Service: Urology;  Laterality: N/A;  . CYSTOSCOPY WITH INJECTION  06/17/2012   Procedure: CYSTOSCOPY WITH INJECTION;  Surgeon: Reece Packer, MD;  Location: [place];  Service: Urology;  Laterality: N/A;  BOTOX  . CYSTOSCOPY WITH INJECTION N/A 12/08/2012   Procedure: CYSTOSCOPY WITH BOTOX INJECTION;  Surgeon: Reece Packer, MD;  Location: [facility];  Service: Urology;  Laterality: N/A;  . laparoscopy ovarian cystectomy  2000 ([facility]PROX)  . left rotator cuff repair  OCT 2009  . POSTERIOR FUSION CERVICAL SPINE  06-29-2009   C4 - 5  . PUBOVAGINAL SLING N/A 05/01/2013   Procedure:  Lowella Dell;   Surgeon: Reece Packer, MD;  Location: [facility];  Service: Urology;  Laterality: N/A;  . ROTATOR CUFF REPAIR  2001   RIGHT SHOULDER  . TONSILLECTOMY  1981  . VAGINAL HYSTERECTOMY  1992   Social History   Occupational History  . Occupation: retired  Tobacco Use  . Smoking status: Former Smoker    Years: 20.00    Types: Cigarettes    Last attempt to quit: 07/16/2006    Years since quitting: 12.1  . Smokeless tobacco: Never Used  Substance and Sexual Activity  . Alcohol use: Yes    Comment: rare  . Drug use: No  . Sexual activity: Not on file

## 2018-09-19 NOTE — Telephone Encounter (Signed)
Linzess 72 mcg daily would be an alternative. One month supply with 3 refills. Thanks.

## 2018-09-19 NOTE — Telephone Encounter (Signed)
Please advise an alternative to Amitiza

## 2018-09-19 NOTE — Telephone Encounter (Signed)
A user error has taken place: ERROR °

## 2018-09-19 NOTE — Patient Instructions (Signed)
Amitiza 24 mcg twice daily Increase the water in your diet. I recommend at least 72 ounces of water daily. Return to this clinic in 2-3 months

## 2018-09-19 NOTE — Telephone Encounter (Signed)
Spoke with patient, she agreed to the change in medication. I instructed her to call the office if she has any problems.

## 2018-09-22 ENCOUNTER — Telehealth: Payer: Self-pay | Admitting: *Deleted

## 2018-09-22 NOTE — Telephone Encounter (Signed)
Bone Density Scan shows Osteopenia.  T-Score -2.2 On Reclast- last infusion 12/2017  Per Dr. Estanislado Pandy stay on current Calcium and Vitamin D. Hold Reclast.

## 2018-09-23 ENCOUNTER — Encounter: Payer: Self-pay | Admitting: Gastroenterology

## 2018-09-23 NOTE — Telephone Encounter (Signed)
Patient advised bone density scan shows Osteopenia and to continue Calcium and Vitamin D. Patient advised we will hold Reclast. Patient verbalized understanding.

## 2018-09-23 NOTE — Telephone Encounter (Signed)
Left pt detailed message that she can pick up samples of Linzess from the office.

## 2018-10-31 ENCOUNTER — Other Ambulatory Visit: Payer: Self-pay | Admitting: Physician Assistant

## 2018-10-31 NOTE — Telephone Encounter (Signed)
Last visit: 06/19/18 Next Visit: 12/15/18

## 2018-10-31 NOTE — Telephone Encounter (Signed)
ok 

## 2018-10-31 NOTE — Telephone Encounter (Signed)
Ok to refill 

## 2018-11-01 IMAGING — CT CT HEART MORP W/ CTA COR W/ SCORE W/ CA W/CM &/OR W/O CM
4 of 7 series · 8 of 20 positions shown, 9 images · IV contrast (APPLIED)
Comparison: Chest CT 12/23/2009 O.

CLINICAL DATA: Chest pain

EXAM:
Cardiac CTA
MEDICATIONS:
Sub lingual nitro. 4mg and lopressor 10mg
TECHNIQUE: The patient was scanned on a Siemens Force [REDACTED]ice scanner. Gantry
rotation speed was 250 msecs. Collimation was . 6 mm. A 100 kV
prospective scan was triggered in the ascending thoracic aorta at
140 HU's with full mA between 35-75% of the R-R interval. Average HR
during the scan was 62 bpm. The 3D data set was interpreted on a
dedicated work station using MPR, MIP and VRT modes. A total of 80cc
of contrast was used.

[Series 6: best diast 69 % · axial · 0.32mm/px · z∈[+1109,+1166]mm · 2 of 428 slices shown, 3 images]
[im 143/428  vessel]
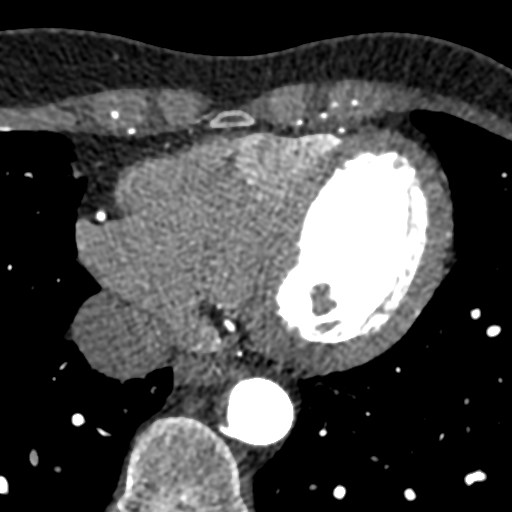
[im 143/428  lung]
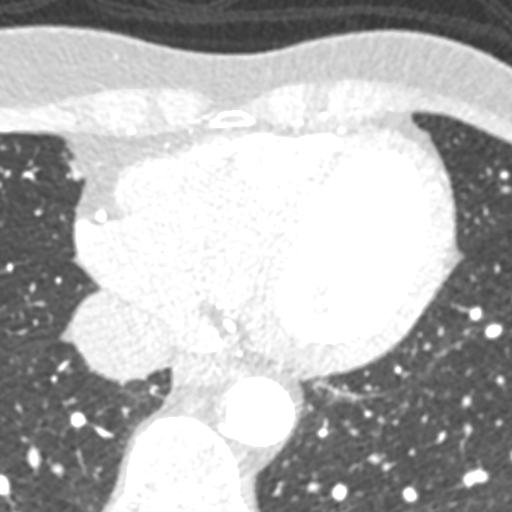
[im 285/428  vessel]
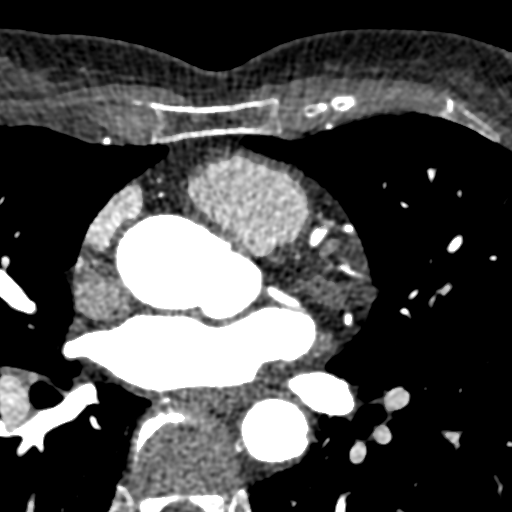

[Series 7: best syst 41 % · axial · 0.32mm/px · z∈[+1109,+1166]mm · 2 of 428 slices shown]
[im 143/428  vessel]
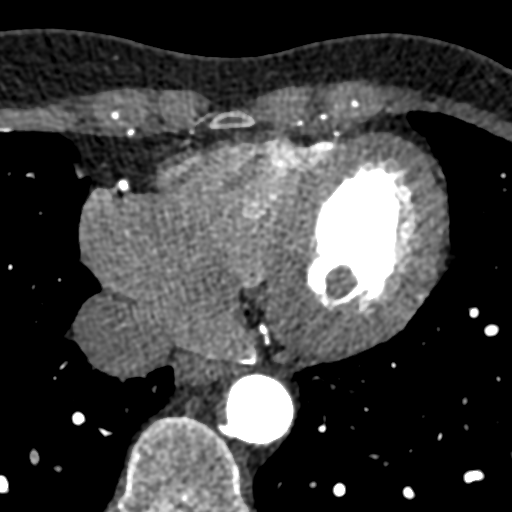
[im 285/428  vessel]
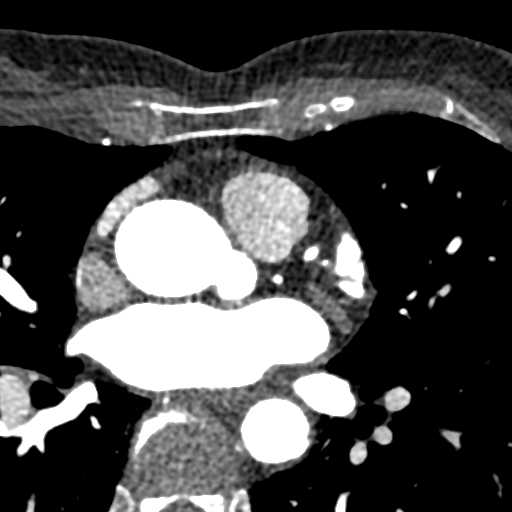

[Series 8: ts diast sharp 69 % · axial · 0.32mm/px · z∈[+1109,+1166]mm · 2 of 428 slices shown]
[im 143/428  lung]
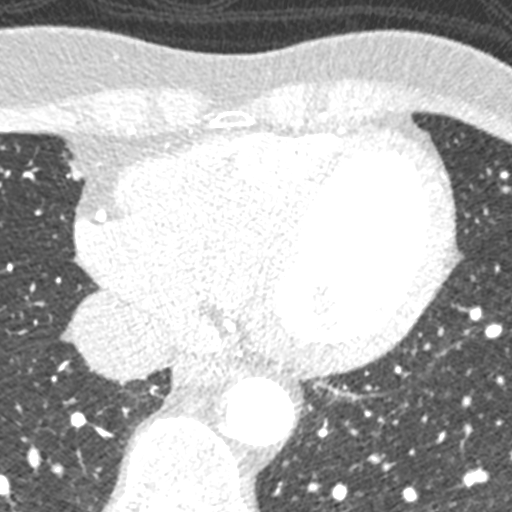
[im 285/428  lung]
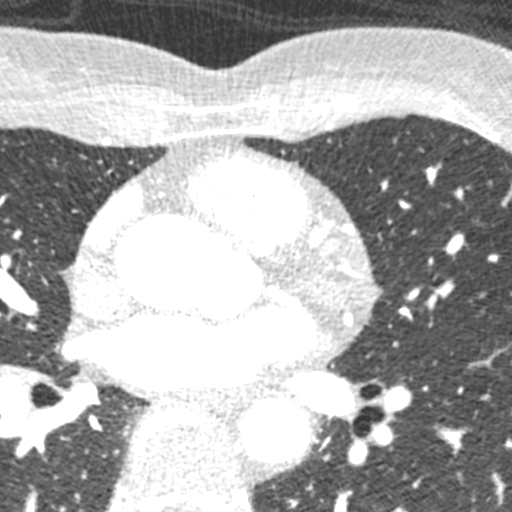

[Series 9: ts syst sharp 41 % · axial · 0.32mm/px · z∈[+1109,+1166]mm · 2 of 428 slices shown]
[im 143/428  lung]
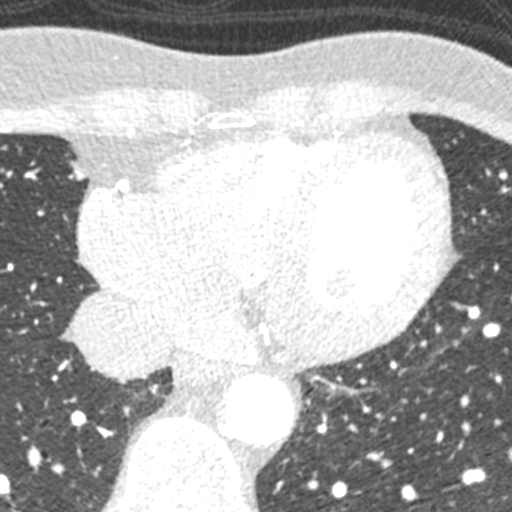
[im 285/428  lung]
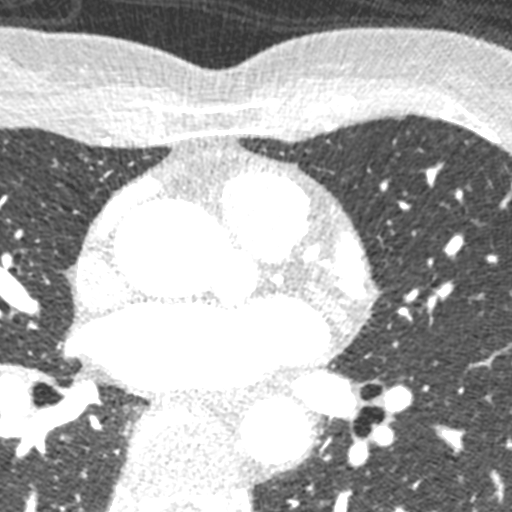

[8 of 20 positions shown; findings below may reference images not displayed]

FINDINGS: Non-cardiac: See separate report from [REDACTED]. No
significant findings on limited lung and soft tissue windows.

Calcium Score:  0

Aortic Root 3.6 cm

Coronary Arteries: Right dominant with no anomalies

LM:  Normal

LAD:  Normal

D1:  Small high take off normal

D2:  Large and normal

Circumflex:  Normal

OM1:  Large branching vessel normal

RCA:  Dominant and normal

PDA:  Normal

PLA:  Normal
IMPRESSION: 1) Calcium score 0

2) Normal right dominant coronary arteries

3) Mild aortic atherosclerosis

4) Aortic Root 3.6 cm

Be Shock

EXAM:
OVER-READ INTERPRETATION  CT CHEST

The following report is an over-read performed by radiologist Dr.
over-read does not include interpretation of cardiac or coronary
anatomy or pathology. The coronary calcium score/coronary CTA
interpretation by the cardiologist is attached.
FINDINGS: Aortic atherosclerosis. Within the visualized portions of the thorax
there are no suspicious appearing pulmonary nodules or masses, there
is no acute consolidative airspace disease, no pleural effusions, no
pneumothorax and no lymphadenopathy. Visualized portions of the
upper abdomen are unremarkable. There are no aggressive appearing
lytic or blastic lesions noted in the visualized portions of the
skeleton.
IMPRESSION: 1. Aortic atherosclerosis.

## 2018-12-15 ENCOUNTER — Ambulatory Visit: Payer: Medicare Other | Admitting: Physician Assistant

## 2018-12-19 ENCOUNTER — Ambulatory Visit: Payer: Medicare Other | Admitting: Physician Assistant

## 2019-01-09 ENCOUNTER — Telehealth: Payer: Self-pay | Admitting: Physical Medicine and Rehabilitation

## 2019-01-12 NOTE — Telephone Encounter (Signed)
ok 

## 2019-01-13 NOTE — Telephone Encounter (Signed)
Scheduled for 5/28 at 0930 with driver.

## 2019-01-16 ENCOUNTER — Ambulatory Visit: Payer: Self-pay | Admitting: Physician Assistant

## 2019-01-21 NOTE — Progress Notes (Signed)
Office Visit Note  Patient: Brenda Gray             Date of Birth: 08-31-1961           MRN: [id number]             PCP: Leanna Battles, MD Referring: Leanna Battles, MD Visit Date: 01/30/2019 Occupation: @GUAROCC @  Subjective:  Left shoulder pain     History of Present Illness: Brenda Gray is a 58 y.o. female with history of osteoarthritis and DDD.   She presents today with chronic left shoulder joint pain.  She states she has good ROM and performs stretching and strengthening exercises daily.  She reports that in the past she significant pain relief after subacromial bursa injection.  She states that she has had a glenohumeral joint injection with hospital provide any relief.  She continues to have left ankle joint pain.  She states that about 9 months ago her grandson kicked her in the ankle and it has hurt since.  She is describes the pain is constant.  She states that she experiences sharp pain and point tenderness.  She denies any swelling or bruising.  She is able to bear full weight.  She states that she has intermittent pain and swelling in bilateral hands if she performs overuse activities and yard work.  She states that overall her knee joints are doing well.  She denies any knee joint swelling at this time.  She has chronic lower back pain and is having an injection performed by Dr. Ernestina Patches next week.   Activities of Daily Living:  Patient reports morning stiffness for 30 minutes.   Patient Reports nocturnal pain.  Difficulty dressing/grooming: Denies Difficulty climbing stairs: Denies Difficulty getting out of chair: Denies Difficulty using hands for taps, buttons, cutlery, and/or writing: Reports  Review of Systems  Constitutional: Negative for fatigue.  HENT: Negative for mouth sores, mouth dryness and nose dryness.   Eyes: Negative for pain, visual disturbance and dryness.  Respiratory: Negative for cough, hemoptysis, shortness of breath and  difficulty breathing.   Cardiovascular: Negative for chest pain, palpitations, hypertension and swelling in legs/feet.  Gastrointestinal: Negative for blood in stool, constipation and diarrhea.  Endocrine: Negative for increased urination.  Genitourinary: Negative for difficulty urinating and painful urination.  Musculoskeletal: Positive for arthralgias, joint pain and morning stiffness. Negative for joint swelling, myalgias, muscle weakness, muscle tenderness and myalgias.  Skin: Negative for color change, pallor, rash, hair loss, nodules/bumps, skin tightness, ulcers and sensitivity to sunlight.  Allergic/Immunologic: Negative for susceptible to infections.  Neurological: Negative for dizziness and headaches.  Hematological: Negative for bruising/bleeding tendency and swollen glands.  Psychiatric/Behavioral: Positive for sleep disturbance. Negative for depressed mood. The patient is not nervous/anxious.     PMFS History:  Patient Active Problem List   Diagnosis Date Noted   Atypical chest pain 06/04/2017   S/P rotator cuff repair 11/04/2016   Dyslipidemia 11/04/2016   History of peptic ulcer disease 11/04/2016   Interstitial cystitis 11/04/2016   DJD (degenerative joint disease), cervical 10/29/2016   Spondylosis of lumbar region without myelopathy or radiculopathy 10/29/2016   Primary osteoarthritis of both knees 10/29/2016   DDD (degenerative disc disease), thoracic 10/29/2016   Primary insomnia 10/29/2016   History of recurrent cystitis 10/29/2016   Primary osteoarthritis of both hands 10/29/2016   Morton neuroma, right 10/15/2016   DYSPHAGIA UNSPECIFIED 07/31/2010   ABDOMINAL PAIN, EPIGASTRIC 07/31/2010    Past Medical History:  Diagnosis Date  Arthritis    Atypical chest pain 06/04/2017   Chronic headache S/P CERVICAL FUSION'S   Cystitis, interstitial    frequency/urgency/nocturia   DDD (degenerative disc disease), cervical    Dry eyes     Frequency of urination    H/O: upper GI bleed    Hyperlipidemia    Mild acid reflux OCCASIONAL--  WATCHES DIET   Nocturia    Numbness s/p cerival fusion   right side pain and numbness    Pulmonary nodule noted in 2010   right upper lobe -- stable (monitored by pcp)   Status post dilation of esophageal narrowing 2011   Urge incontinence of urine     Family History  Problem Relation Age of Onset   Hypertension Mother    Heart failure Mother    Colon polyps Mother    Other Father        had to have esogasus dilated   Heart disease Brother    Stroke Maternal Grandmother    Hypertension Brother    Heart disease Brother    Healthy Son    Esophageal cancer Neg Hx    Colon cancer Neg Hx    Rectal cancer Neg Hx    Stomach cancer Neg Hx    Past Surgical History:  Procedure Laterality Date   CATARACT EXTRACTION, BILATERAL  07/2017   with lens implants   CERVICAL DISKECTOMY AND FUSION  05-14-2002   C5 - 7   CYSTO WITH HYDRODISTENSION  07/19/2011   Procedure: CYSTOSCOPY/HYDRODISTENSION;  Surgeon: Malka So;  Location: [place];  Service: Urology;  Laterality: N/A;  CYSTOSCOPY WITH HYDORDISTENTION OF BLADDER AND INSTILLATION OF MARCAINE AND PYRIDIUM   cysto/ hod  03-10-2009   [facility]   CYSTOSCOPY N/A 05/01/2013   Procedure: CYSTOSCOPY;  Surgeon: Reece Packer, MD;  Location: [facility];  Service: Urology;  Laterality: N/A;   CYSTOSCOPY WITH INJECTION  06/17/2012   Procedure: CYSTOSCOPY WITH INJECTION;  Surgeon: Reece Packer, MD;  Location: [place];  Service: Urology;  Laterality: N/A;  BOTOX   CYSTOSCOPY WITH INJECTION N/A 12/08/2012   Procedure: CYSTOSCOPY WITH BOTOX INJECTION;  Surgeon: Reece Packer, MD;  Location: [place];  Service: Urology;  Laterality: N/A;   laparoscopy ovarian cystectomy  2000 (APPROX)   left rotator cuff repair  OCT 2009   POSTERIOR FUSION  CERVICAL SPINE  06-29-2009   C4 - 5   PUBOVAGINAL SLING N/A 05/01/2013   Procedure:  Lowella Dell;  Surgeon: Reece Packer, MD;  Location: [facility];  Service: Urology;  Laterality: N/A;   ROTATOR CUFF REPAIR  2001   RIGHT SHOULDER   TONSILLECTOMY  1981   VAGINAL HYSTERECTOMY  1992   Social History   Social History Narrative   Not on file    There is no immunization history on file for this patient.   Objective: Vital Signs: BP 111/66 (BP Location: Left Arm, Patient Position: Sitting, Cuff Size: Normal)    Pulse 68    Resp 14    Ht 5' 4.5" (1.638 m)    Wt 141 lb 9.6 oz (64.2 kg)    BMI 23.93 kg/m    Physical Exam Vitals signs and nursing note reviewed.  Constitutional:      Appearance: She is well-developed.  HENT:     Head: Normocephalic and atraumatic.  Eyes:     Conjunctiva/sclera: Conjunctivae normal.  Neck:     Musculoskeletal: Normal range  of motion.  Cardiovascular:     Rate and Rhythm: Normal rate and regular rhythm.     Heart sounds: Normal heart sounds.  Pulmonary:     Effort: Pulmonary effort is normal.     Breath sounds: Normal breath sounds.  Abdominal:     General: Bowel sounds are normal.     Palpations: Abdomen is soft.  Lymphadenopathy:     Cervical: No cervical adenopathy.  Skin:    General: Skin is warm and dry.     Capillary Refill: Capillary refill takes less than 2 seconds.  Neurological:     Mental Status: She is alert and oriented to person, place, and time.  Psychiatric:        Behavior: Behavior normal.      Musculoskeletal Exam: C-spine limited flexion and extension.  Full passive ROM with lateral rotation.  Thoracic and lumbar spine good ROM.  No midline spinal tenderness.  No SI joint tenderness.  Shoulder joints, elbow joints, wrist joints, MCPs, PIPs, and DIPs good ROM with no synovitis.  PIP and DIP synovial thickening consistent with osteoarthritis of both hands.  Hip joints, knee joints, ankle joints, MTPs,  PIPs, and DIPs good ROM with no synovitis.  No warmth or effusion of knee joints.  Bilateral knee joint crepitus.  Tenderness over both trochanteric bursa bilaterally.  Tenderness over the lateral malleolus of the left ankle.   CDAI Exam: CDAI Score: Not documented Patient Global Assessment: Not documented; Provider Global Assessment: Not documented Swollen: Not documented; Tender: Not documented Joint Exam   Not documented   There is currently no information documented on the homunculus. Go to the Rheumatology activity and complete the homunculus joint exam.  Investigation: No additional findings.  Imaging: No results found.  Recent Labs: Lab Results  Component Value Date   WBC 5.3 11/12/2017   HGB 13.6 11/12/2017   PLT 250 11/12/2017   NA 142 12/17/2017   K 4.7 12/17/2017   CL 107 12/17/2017   CO2 31 12/17/2017   GLUCOSE 89 12/17/2017   BUN 15 12/17/2017   CREATININE 0.77 12/17/2017   BILITOT 0.4 12/17/2017   ALKPHOS 65 11/05/2016   AST 25 12/17/2017   ALT 17 12/17/2017   PROT 6.5 12/17/2017   ALBUMIN 4.3 11/05/2016   CALCIUM 9.6 12/17/2017   GFRAA 98 12/17/2017    Speciality Comments: Dexa: 09/08/18 Osteopenia T-Score -2.2 BMD 0.800 Hold reclast  Procedures:  Large Joint Inj: L subacromial bursa on 01/30/2019 10:22 AM Indications: pain Details: 27 G 1.5 in needle, posterior approach  Arthrogram: No  Medications: 1 mL lidocaine 1 %; 40 mg triamcinolone acetonide 40 MG/ML Aspirate: 0 mL Outcome: tolerated well, no immediate complications Procedure, treatment alternatives, risks and benefits explained, specific risks discussed. Consent was given by the patient. Immediately prior to procedure a time out was called to verify the correct patient, procedure, equipment, support staff and site/side marked as required. Patient was prepped and draped in the usual sterile fashion.     Allergies: Cymbalta [duloxetine hcl] and Lyrica [pregabalin]   Assessment / Plan:      Visit Diagnoses: Primary osteoarthritis of both hands: She has PIP and DIP synovial thickening consistent with osteoarthritis of both hands.  She has complete fist formation bilaterally.  No synovitis or tenderness was noted.  She experiences intermittent stiffness and swelling in her hands after performing overuse activities and yard work.  Joint protection and muscle strengthening were discussed.  Primary osteoarthritis of both knees: No warmth or  effusion noted.  She has good range of motion.  She has bilateral knee crepitus.  She experiences some weakness in the left knee joint when going up and down steps.  S/P rotator cuff repair: She has chronic left shoulder joint pain.  She has full range of motion on exam.  She has crepitus of the left shoulder.  She requested a left shoulder subacromial cortisone injection.  She tolerated the procedure well.  The procedure note was completed above.  Aftercare was discussed.  Pain in left ankle and joints of left foot: She has tenderness over the left lateral malleolus.  About 9 months ago her grandson kicked her ankle and she has had discomfort since.  She has point tenderness and sharp pain over the lateral malleolus.  No ecchymosis or swelling was noted.  She is good range of motion of the left ankle joint.  She is able to bear full weight.  X-ray of the foot was obtained on 06/19/2018 that did not reveal any obvious bony abnormalities.  We discussed the use of Voltaren gel topically and the importance of wearing supportive shoes.  Her discomfort is most likely due to tendonitis.  She was advised to follow up with Dr. Sharol Given if her pain persists.   Age-related osteoporosis without current pathological fracture -Last Dexa scan on 09/08/18 showed T-score of -2.2 at [facility]. Positive 11 % change in BMD compared to previous reading in 2017-T-score -3.0 at that time.  Previously treated with Reclast and last infusion April 2019.  Reclast is on hold and she continues  calcium and vitamin D supplementation.    DDD (degenerative disc disease), cervical: She has limited range of motion of the C-spine especially with flexion and extension.  She has no symptoms of radiculopathy at this time.  She performs neck stretching exercises on a regular basis.  DDD (degenerative disc disease), thoracic:  No midline spinal tenderness.   DDD (degenerative disc disease), lumbar: Chronic pain.  She has appointment with Dr. Ernestina Patches next week for a steroid injection.  Other medical conditions are listed as follows:   Dyslipidemia  History of peptic ulcer disease  Interstitial cystitis   Orders: Orders Placed This Encounter  Procedures   Large Joint Inj   No orders of the defined types were placed in this encounter.   Face-to-face time spent with patient was 30 minutes. Greater than 50% of time was spent in counseling and coordination of care.  Follow-Up Instructions: Return in about 6 months (around 08/02/2019) for Osteoarthritis, DDD.   Ofilia Neas, PA-C   I examined and evaluated the patient with Hazel Sams PA.  Patient had no synovitis on examination today.  She has been having pain and discomfort in the left subacromial bursa.  Per her request labs subacromial bursa was injected with cortisone as described above.  She tolerated the procedure well.  She has been also having some discomfort in her knee joint.  The plan of care was discussed as noted above.  Bo Merino, MD  Note - This record has been created using Editor, commissioning.  Chart creation errors have been sought, but may not always  have been located. Such creation errors do not reflect on  the standard of medical care.

## 2019-01-30 ENCOUNTER — Other Ambulatory Visit: Payer: Self-pay

## 2019-01-30 ENCOUNTER — Encounter: Payer: Self-pay | Admitting: Rheumatology

## 2019-01-30 ENCOUNTER — Ambulatory Visit (INDEPENDENT_AMBULATORY_CARE_PROVIDER_SITE_OTHER): Payer: Medicare Other | Admitting: Rheumatology

## 2019-01-30 VITALS — BP 111/66 | HR 68 | Resp 14 | Ht 64.5 in | Wt 141.6 lb

## 2019-01-30 DIAGNOSIS — M5134 Other intervertebral disc degeneration, thoracic region: Secondary | ICD-10-CM | POA: Diagnosis not present

## 2019-01-30 DIAGNOSIS — M19042 Primary osteoarthritis, left hand: Secondary | ICD-10-CM

## 2019-01-30 DIAGNOSIS — M170 Bilateral primary osteoarthritis of knee: Secondary | ICD-10-CM

## 2019-01-30 DIAGNOSIS — M19041 Primary osteoarthritis, right hand: Secondary | ICD-10-CM

## 2019-01-30 DIAGNOSIS — N3010 Interstitial cystitis (chronic) without hematuria: Secondary | ICD-10-CM

## 2019-01-30 DIAGNOSIS — M51369 Other intervertebral disc degeneration, lumbar region without mention of lumbar back pain or lower extremity pain: Secondary | ICD-10-CM

## 2019-01-30 DIAGNOSIS — M5030 Other cervical disc degeneration, unspecified cervical region: Secondary | ICD-10-CM | POA: Diagnosis not present

## 2019-01-30 DIAGNOSIS — G8929 Other chronic pain: Secondary | ICD-10-CM

## 2019-01-30 DIAGNOSIS — Z9889 Other specified postprocedural states: Secondary | ICD-10-CM | POA: Diagnosis not present

## 2019-01-30 DIAGNOSIS — Z8711 Personal history of peptic ulcer disease: Secondary | ICD-10-CM

## 2019-01-30 DIAGNOSIS — M25572 Pain in left ankle and joints of left foot: Secondary | ICD-10-CM

## 2019-01-30 DIAGNOSIS — M25512 Pain in left shoulder: Secondary | ICD-10-CM | POA: Diagnosis not present

## 2019-01-30 DIAGNOSIS — E785 Hyperlipidemia, unspecified: Secondary | ICD-10-CM

## 2019-01-30 DIAGNOSIS — M810 Age-related osteoporosis without current pathological fracture: Secondary | ICD-10-CM | POA: Diagnosis not present

## 2019-01-30 DIAGNOSIS — M5136 Other intervertebral disc degeneration, lumbar region: Secondary | ICD-10-CM | POA: Diagnosis not present

## 2019-01-30 MED ORDER — TRIAMCINOLONE ACETONIDE 40 MG/ML IJ SUSP
40.0000 mg | INTRAMUSCULAR | Status: AC | PRN
  Administered 2019-01-30: 40 mg via INTRA_ARTICULAR

## 2019-01-30 MED ORDER — LIDOCAINE HCL 1 % IJ SOLN
1.0000 mL | INTRAMUSCULAR | Status: AC | PRN
  Administered 2019-01-30: 1 mL

## 2019-02-01 ENCOUNTER — Other Ambulatory Visit: Payer: Self-pay

## 2019-02-01 ENCOUNTER — Ambulatory Visit (HOSPITAL_COMMUNITY)
Admission: EM | Admit: 2019-02-01 | Discharge: 2019-02-01 | Disposition: A | Discharge status: Home / Self Care | Payer: Medicare Other | Attending: Emergency Medicine | Admitting: Emergency Medicine

## 2019-02-01 ENCOUNTER — Encounter (HOSPITAL_COMMUNITY): Payer: Self-pay

## 2019-02-01 DIAGNOSIS — Z23 Encounter for immunization: Secondary | ICD-10-CM

## 2019-02-01 DIAGNOSIS — S01511A Laceration without foreign body of lip, initial encounter: Secondary | ICD-10-CM

## 2019-02-01 DIAGNOSIS — W293XXA Contact with powered garden and outdoor hand tools and machinery, initial encounter: Secondary | ICD-10-CM | POA: Diagnosis not present

## 2019-02-01 MED ORDER — TETANUS-DIPHTH-ACELL PERTUSSIS 5-2.5-18.5 LF-MCG/0.5 IM SUSP
INTRAMUSCULAR | Status: AC
  Filled 2019-02-01: qty 0.5

## 2019-02-01 MED ORDER — TETANUS-DIPHTH-ACELL PERTUSSIS 5-2.5-18.5 LF-MCG/0.5 IM SUSP
0.5000 mL | Freq: Once | INTRAMUSCULAR | Status: AC
  Administered 2019-02-01: 11:00:00 0.5 mL via INTRAMUSCULAR

## 2019-02-01 MED ORDER — LIDOCAINE HCL (PF) 1 % IJ SOLN
INTRAMUSCULAR | Status: AC
  Filled 2019-02-01: qty 2

## 2019-02-01 NOTE — ED Provider Notes (Signed)
[place]    CSN: [id number] Arrival date & time: 02/01/19  1016     History   Chief Complaint Chief Complaint  Patient presents with  . Laceration    HPI Brenda Gray is a 58 y.o. female for lip laceration.  Patient states that she was chopping vines in her yard, Vine recoiled and clippers hit her in the face causing lip laceration.  Patient not on any blood thinners or NSAIDs routinely.  Applied pressure which resolved and bleeding.  Patient states she is in some pain, though ice helped to numb it.  Is unsure when last tetanus was.  Past Medical History:  Diagnosis Date  . Arthritis   . Atypical chest pain 06/04/2017  . Chronic headache S/P CERVICAL FUSION'S  . Cystitis, interstitial    frequency/urgency/nocturia  . DDD (degenerative disc disease), cervical   . Dry eyes   . Frequency of urination   . H/O: upper GI bleed   . Hyperlipidemia   . Mild acid reflux OCCASIONAL--  WATCHES DIET  . Nocturia   . Numbness s/p cerival fusion   right side pain and numbness   . Pulmonary nodule noted in 2010   right upper lobe -- stable (monitored by pcp)  . Status post dilation of esophageal narrowing 2011  . Urge incontinence of urine     Patient Active Problem List   Diagnosis Date Noted  . Atypical chest pain 06/04/2017  . S/P rotator cuff repair 11/04/2016  . Dyslipidemia 11/04/2016  . History of peptic ulcer disease 11/04/2016  . Interstitial cystitis 11/04/2016  . DJD (degenerative joint disease), cervical 10/29/2016  . Spondylosis of lumbar region without myelopathy or radiculopathy 10/29/2016  . Primary osteoarthritis of both knees 10/29/2016  . 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 Primary insomnia 10/29/2016  . History of recurrent cystitis 10/29/2016  . Primary osteoarthritis of both hands 10/29/2016  . Morton neuroma, right 10/15/2016  . DYSPHAGIA UNSPECIFIED 07/31/2010  . ABDOMINAL PAIN, EPIGASTRIC 07/31/2010    Past  Surgical History:  Procedure Laterality Date  . CATARACT EXTRACTION, BILATERAL  07/2017   with lens implants  . CERVICAL DISKECTOMY AND FUSION  05-14-2002   C5 - 7  . CYSTO WITH HYDRODISTENSION  07/19/2011   Procedure: CYSTOSCOPY/HYDRODISTENSION;  Surgeon: Malka So;  Location: [place];  Service: Urology;  Laterality: N/A;  CYSTOSCOPY WITH HYDORDISTENTION OF BLADDER AND INSTILLATION OF MARCAINE AND PYRIDIUM  . cysto/ hod  03-10-2009   [facility]  . CYSTOSCOPY N/A 05/01/2013   Procedure: CYSTOSCOPY;  Surgeon: Reece Packer, MD;  Location[facility];  Service: Urology;  Laterality: N/A;  . CYSTOSCOPY WITH INJECTION  06/17/2012   Procedure: CYSTOSCOPY WITH INJECTION;  Surgeon: Reece Packer, MD;  Location: [place];  Service: Urology;  Laterality: N/A;  BOTOX  . CYSTOSCOPY WITH INJECTION N/A 12/08/2012   Procedure: CYSTOSCOPY WITH BOTOX INJECTION;  Surgeon: Reece Packer, MD;  Location: [place];  Service: Urology;  Laterality: N/A;  . laparoscopy ovarian cystectomy  2000 (APPROX)  . left rotator cuff repair  OCT 2009  . POSTERIOR FUSION CERVICAL SPINE  06-29-2009   C4 - 5  . PUBOVAGINAL SLING N/A 05/01/2013   Procedure:  Lowella Dell;  Surgeon: Reece Packer, MD;  Location: [facility];  Service: Urology;  Laterality: N/A;  . ROTATOR CUFF REPAIR  2001   RIGHT SHOULDER  . TONSILLECTOMY  1981  .  VAGINAL HYSTERECTOMY  1992    OB History   No obstetric history on file.      Home Medications    Prior to Admission medications   Medication Sig Start Date End Date Taking? Authorizing Provider  ALPRAZolam Duanne Moron) 0.5 MG tablet alprazolam 0.5 mg tablet    [provider]  Calcium Carbonate-Vitamin D (CALCIUM 600 + D PO) Take 1 tablet by mouth 2 (two) times daily.    [provider]  Cetirizine HCl (ZYRTEC ALLERGY PO) Take 1 tablet by mouth daily.     [provider]   Ginger, Zingiber officinalis, (GINGER PO) Take 1 tablet by mouth daily.     [provider]  Multiple Vitamin (MULTIVITAMIN) tablet Take 1 tablet by mouth daily.     [provider]  Omega-3 Fatty Acids (FISH OIL) 1000 MG CAPS Take by mouth.    [provider]  omeprazole (PRILOSEC) 40 MG capsule Take 40 mg by mouth daily.    [provider]  pravastatin (PRAVACHOL) 20 MG tablet Take 20 mg by mouth every evening.     [provider]  Probiotic Product (PROBIOTIC DAILY PO) Take 1 tablet by mouth daily.     [provider]  tiZANidine (ZANAFLEX) 4 MG tablet TAKE 1 TABLET BY MOUTH EVERY NIGHT AT BEDTIME AS NEEDED 10/31/18   Bo Merino, MD  traMADol (ULTRAM) 50 MG tablet Take 50 mg by mouth every 6 (six) hours as needed for pain.    [provider]  TURMERIC PO Take 1 tablet by mouth daily.     [provider]    Family History Family History  Problem Relation Age of Onset  . Hypertension Mother   . Heart failure Mother   . Colon polyps Mother   . Other Father        had to have esogasus dilated  . Heart disease Brother   . Stroke Maternal Grandmother   . Hypertension Brother   . Heart disease Brother   . Healthy Son   . Esophageal cancer Neg Hx   . Colon cancer Neg Hx   . Rectal cancer Neg Hx   . Stomach cancer Neg Hx     Social History Social History   Tobacco Use  . Smoking status: Former Smoker    Packs/day: 0.10    Years: 20.00    Pack years: 2.00    Types: Cigarettes    Last attempt to quit: 07/16/2006    Years since quitting: 12.5  . Smokeless tobacco: Never Used  Substance Use Topics  . Alcohol use: Yes    Comment: rare  . Drug use: No     Allergies   Cymbalta [duloxetine hcl] and Lyrica [pregabalin]   Review of Systems Review of Systems as per HPI   Physical Exam Triage Vital Signs ED Triage Vitals  Enc Vitals Group     BP 02/01/19 1024 131/61     Pulse Rate 02/01/19 1024  70     Resp 02/01/19 1024 16     Temp 02/01/19 1024 98.5 F (36.9 C)     Temp Source 02/01/19 1024 Oral     SpO2 02/01/19 1024 98 %     Weight 02/01/19 1026 140 lb (63.5 kg)     Height --      Head Circumference --      Peak Flow --      Pain Score 02/01/19 1026 10     Pain Loc --  Pain Edu? --      Excl. in [place]? --    No data found.  Updated Vital Signs BP 131/61   Pulse 70   Temp 98.5 F (36.9 C) (Oral)   Resp 16   Wt 140 lb (63.5 kg)   SpO2 98%   BMI 23.66 kg/m   Visual Acuity Right Eye Distance:   Left Eye Distance:   Bilateral Distance:    Right Eye Near:   Left Eye Near:    Bilateral Near:     Physical Exam Constitutional:      General: She is not in acute distress.    Appearance: She is normal weight.  Cardiovascular:     Rate and Rhythm: Normal rate.  Pulmonary:     Effort: Pulmonary effort is normal.  Skin:    Comments: Right upper lip with deep laceration.  Small puncture on anterior lip and superficial laceration on upper gumline.  No dental pain.  Bleeding well controlled.  No foreign body identified.  Neurological:     Mental Status: She is alert.      UC Treatments / Results  Labs (all labs ordered are listed, but only abnormal results are displayed) Labs Reviewed - No data to display  EKG None  Radiology No results found.  Procedures Laceration Repair Date/Time: 02/01/2019 11:12 AM Performed by: Quincy Sheehan, PA-C Authorized by: Quincy Sheehan, PA-C   Consent:    Consent obtained:  Verbal   Consent given by:  Patient   Risks discussed:  Infection, need for additional repair, pain, poor cosmetic result and poor wound healing   Alternatives discussed:  No treatment and delayed treatment Universal protocol:    Patient identity confirmed:  Verbally with patient Anesthesia (see MAR for exact dosages):    Anesthesia method:  Local infiltration   Local anesthetic:  Lidocaine 2% w/o epi Laceration details:     Location:  Lip   Lip location:  Upper exterior lip   Length (cm):  1   Depth (mm):  7 Repair type:    Repair type:  Intermediate Pre-procedure details:    Preparation:  Patient was prepped and draped in usual sterile fashion Exploration:    Contaminated: no   Treatment:    Area cleansed with:  Saline   Amount of cleaning:  Standard   Irrigation solution:  Sterile saline   Irrigation volume:  10 cc   Irrigation method:  Syringe Skin repair:    Repair method:  Sutures   Suture size:  5-0   Suture material:  Prolene   Suture technique:  Simple interrupted   Number of sutures:  3 Approximation:    Approximation:  Close   Vermilion border: well-aligned   Post-procedure details:    Dressing:  Antibiotic ointment   Patient tolerance of procedure:  Tolerated well, no immediate complications   (including critical care time)  Medications Ordered in UC Medications  Tdap (BOOSTRIX) injection 0.5 mL (0.5 mLs Intramuscular Given 02/01/19 1113)    Initial Impression / Assessment and Plan / UC Course  I have reviewed the triage vital signs and the nursing notes.  Pertinent labs & imaging results that were available during my care of the patient were reviewed by me and considered in my medical decision making (see chart for details).     58 year old female with right-sided upper lip laceration.  Patient received tetanus booster in office today.  3 sutures were placed, vermilion border well aligned and patient tolerated procedure well.  Patient to return in 3 to 5 days for suture removal. Final Clinical Impressions(s) / UC Diagnoses   Final diagnoses:  Lip laceration, initial encounter     Discharge Instructions     Keep mouth clean/dry. Apply ice and take NSAIDs for additional swelling & pain relief. Return to clinic in 3-5 days for suture removal.    ED Prescriptions    None     Controlled Substance Prescriptions [place] Controlled Substance Registry consulted? Not  Applicable   Quincy Sheehan, [place] 02/01/19 1138

## 2019-02-01 NOTE — ED Triage Notes (Signed)
Pt states she was clipping a brush over head and the clippers popped back and hit her in the lip causing laceration. This happened about 30 minutes ago.

## 2019-02-01 NOTE — Discharge Instructions (Signed)
Keep mouth clean/dry. Apply ice and take NSAIDs for additional swelling & pain relief. Return to clinic in 3-5 days for suture removal.

## 2019-02-05 ENCOUNTER — Encounter: Payer: Medicare Other | Admitting: Physical Medicine and Rehabilitation

## 2019-02-09 ENCOUNTER — Encounter: Payer: Medicare Other | Admitting: Physical Medicine and Rehabilitation

## 2019-02-12 ENCOUNTER — Encounter: Payer: Medicare Other | Admitting: Physical Medicine and Rehabilitation

## 2019-02-13 DIAGNOSIS — Z961 Presence of intraocular lens: Secondary | ICD-10-CM | POA: Diagnosis not present

## 2019-02-16 ENCOUNTER — Encounter: Payer: Self-pay | Admitting: Gastroenterology

## 2019-02-23 ENCOUNTER — Other Ambulatory Visit: Payer: Self-pay

## 2019-02-23 ENCOUNTER — Encounter: Payer: Self-pay | Admitting: Physical Medicine and Rehabilitation

## 2019-02-23 ENCOUNTER — Ambulatory Visit: Payer: Self-pay

## 2019-02-23 ENCOUNTER — Ambulatory Visit (INDEPENDENT_AMBULATORY_CARE_PROVIDER_SITE_OTHER): Payer: Medicare Other | Admitting: Physical Medicine and Rehabilitation

## 2019-02-23 VITALS — BP 129/75 | HR 58

## 2019-02-23 DIAGNOSIS — M5416 Radiculopathy, lumbar region: Secondary | ICD-10-CM | POA: Diagnosis not present

## 2019-02-23 MED ORDER — BETAMETHASONE SOD PHOS & ACET 6 (3-3) MG/ML IJ SUSP
12.0000 mg | Freq: Once | INTRAMUSCULAR | Status: AC
  Administered 2019-02-23: 09:00:00 12 mg

## 2019-02-23 NOTE — Progress Notes (Signed)
 .  Numeric Pain Rating Scale and Functional Assessment Average Pain 7   In the last MONTH (on 0-10 scale) has pain interfered with the following?  1. General activity like being  able to carry out your everyday physical activities such as walking, climbing stairs, carrying groceries, or moving a chair?  Rating(5)   +Driver, -BT, -Dye Allergies.  

## 2019-03-11 ENCOUNTER — Other Ambulatory Visit: Payer: Self-pay | Admitting: Rheumatology

## 2019-03-11 NOTE — Telephone Encounter (Signed)
Last Visit: 01/30/19 Next Visit: 08/03/19   Okay to refill per Dr. Estanislado Pandy

## 2019-03-16 ENCOUNTER — Encounter: Payer: Medicare Other | Admitting: Gastroenterology

## 2019-04-09 DIAGNOSIS — M810 Age-related osteoporosis without current pathological fracture: Secondary | ICD-10-CM | POA: Diagnosis not present

## 2019-04-09 DIAGNOSIS — E7849 Other hyperlipidemia: Secondary | ICD-10-CM | POA: Diagnosis not present

## 2019-04-09 DIAGNOSIS — R82998 Other abnormal findings in urine: Secondary | ICD-10-CM | POA: Diagnosis not present

## 2019-04-10 NOTE — Procedures (Signed)
S1 Lumbosacral Transforaminal Epidural Steroid Injection - Sub-Pedicular Approach with Fluoroscopic Guidance   Patient: Brenda Gray      Date of Birth: December 24, 1960 MRN: [id number] PCP: Leanna Battles, MD      Visit Date: 02/23/2019   Universal Protocol:    Date/Time: 07/31/203:03 PM  Consent Given By: the patient  Position:  PRONE  Additional Comments: Vital signs were monitored before and after the procedure. Patient was prepped and draped in the usual sterile fashion. The correct patient, procedure, and site was verified.   Injection Procedure Details:  Procedure Site One Meds Administered:  Meds ordered this encounter  Medications  . betamethasone acetate-betamethasone sodium phosphate (CELESTONE) injection 12 mg    Laterality: Bilateral  Location/Site:  S1 Foramen   Needle size: 22 ga.  Needle type: Spinal  Needle Placement: Transforaminal  Findings:   -Comments: Excellent flow of contrast along the nerve and into the epidural space.  Procedure Details: After squaring off the sacral end-plate to get a true AP view, the C-arm was positioned so that the best possible view of the S1 foramen was visualized. The soft tissues overlying this structure were infiltrated with 2-3 ml. of 1% Lidocaine without Epinephrine.    The spinal needle was inserted toward the target using a "trajectory" view along the fluoroscope beam.  Under AP and lateral visualization, the needle was advanced so it did not puncture dura. Biplanar projections were used to confirm position. Aspiration was confirmed to be negative for CSF and/or blood. A 1-2 ml. volume of Isovue-250 was injected and flow of contrast was noted at each level. Radiographs were obtained for documentation purposes.   After attaining the desired flow of contrast documented above, a 0.5 to 1.0 ml test dose of 0.25% Marcaine was injected into each respective transforaminal space.  The patient was observed for 90 seconds  post injection.  After no sensory deficits were reported, and normal lower extremity motor function was noted,   the above injectate was administered so that equal amounts of the injectate were placed at each foramen (level) into the transforaminal epidural space.   Additional Comments:  The patient tolerated the procedure well Dressing: Band-Aid with 2 x 2 sterile gauze    Post-procedure details: Patient was observed during the procedure. Post-procedure instructions were reviewed.  Patient left the clinic in stable condition.

## 2019-04-10 NOTE — Progress Notes (Signed)
Brenda Gray - 58 y.o. female MRN [id number]  Date of birth: August 20, 1961  Office Visit Note: Visit Date: 02/23/2019 PCP: Leanna Battles, MD Referred by: Leanna Battles, MD  Subjective: Chief Complaint  Patient presents with  . Lower Back - Pain  . Left Leg - Pain  . Left Foot - Tingling   HPI:  Brenda Gray is a 58 y.o. female who comes in today For planned repeat bilateral S1 transforaminal epidural steroid injection.  Patient's had a chronic history of lumbosacral pain unamenable to conservative treatment as well as medication management.  She has had complicated cervical multilevel fusion and continued also low back pain.  Prior epidural injection at S1 bilaterally gave her quite a bit of relief in June of last year.  She reports in January began hurting again.  We have had her on a list to do the injection and has been delayed because of the coronavirus.  She has had no new issues or trauma.  She gets some radicular pain on the left side down the posterior leg in the hamstring and calf and a clear S1 distribution.  MRIs are reviewed again are fairly mild.  We will go ahead and repeat the injection today diagnostically and hopefully therapeutically.  ROS Otherwise per HPI.  Assessment & Plan: Visit Diagnoses:  1. Lumbar radiculopathy     Plan: No additional findings.   Meds & Orders:  Meds ordered this encounter  Medications  . betamethasone acetate-betamethasone sodium phosphate (CELESTONE) injection 12 mg    Orders Placed This Encounter  Procedures  . XR C-ARM NO REPORT  . Epidural Steroid injection    Follow-up: Return if symptoms worsen or fail to improve.   Procedures: No procedures performed  S1 Lumbosacral Transforaminal Epidural Steroid Injection - Sub-Pedicular Approach with Fluoroscopic Guidance   Patient: Brenda Gray      Date of Birth: 05-25-1961 MRN: [id number] PCP: Leanna Battles, MD      Visit Date: 02/23/2019   Universal  Protocol:    Date/Time: 07/31/203:03 PM  Consent Given By: the patient  Position:  PRONE  Additional Comments: Vital signs were monitored before and after the procedure. Patient was prepped and draped in the usual sterile fashion. The correct patient, procedure, and site was verified.   Injection Procedure Details:  Procedure Site One Meds Administered:  Meds ordered this encounter  Medications  . betamethasone acetate-betamethasone sodium phosphate (CELESTONE) injection 12 mg    Laterality: Bilateral  Location/Site:  S1 Foramen   Needle size: 22 ga.  Needle type: Spinal  Needle Placement: Transforaminal  Findings:   -Comments: Excellent flow of contrast along the nerve and into the epidural space.  Procedure Details: After squaring off the sacral end-plate to get a true AP view, the C-arm was positioned so that the best possible view of the S1 foramen was visualized. The soft tissues overlying this structure were infiltrated with 2-3 ml. of 1% Lidocaine without Epinephrine.    The spinal needle was inserted toward the target using a "trajectory" view along the fluoroscope beam.  Under AP and lateral visualization, the needle was advanced so it did not puncture dura. Biplanar projections were used to confirm position. Aspiration was confirmed to be negative for CSF and/or blood. A 1-2 ml. volume of Isovue-250 was injected and flow of contrast was noted at each level. Radiographs were obtained for documentation purposes.   After attaining the desired flow of contrast documented above, a 0.5 to 1.0 ml test  dose of 0.25% Marcaine was injected into each respective transforaminal space.  The patient was observed for 90 seconds post injection.  After no sensory deficits were reported, and normal lower extremity motor function was noted,   the above injectate was administered so that equal amounts of the injectate were placed at each foramen (level) into the transforaminal epidural  space.   Additional Comments:  The patient tolerated the procedure well Dressing: Band-Aid with 2 x 2 sterile gauze    Post-procedure details: Patient was observed during the procedure. Post-procedure instructions were reviewed.  Patient left the clinic in stable condition.    Clinical History: Lumbar spine dated 07/04/2012 demonstrates degenerative endplate changes particular at L5-S1 and L4-5 with some facet arthropathy at L4-5.     Objective:  VS:  HT:    WT:   BMI:     BP:129/75  HR:(!) 58bpm  TEMP: ( )  RESP:  Physical Exam  Ortho Exam Imaging: No results found.

## 2019-04-16 DIAGNOSIS — M5136 Other intervertebral disc degeneration, lumbar region: Secondary | ICD-10-CM | POA: Diagnosis not present

## 2019-04-16 DIAGNOSIS — E785 Hyperlipidemia, unspecified: Secondary | ICD-10-CM | POA: Diagnosis not present

## 2019-04-16 DIAGNOSIS — Z1331 Encounter for screening for depression: Secondary | ICD-10-CM | POA: Diagnosis not present

## 2019-04-16 DIAGNOSIS — G4700 Insomnia, unspecified: Secondary | ICD-10-CM | POA: Diagnosis not present

## 2019-04-16 DIAGNOSIS — Z1339 Encounter for screening examination for other mental health and behavioral disorders: Secondary | ICD-10-CM | POA: Diagnosis not present

## 2019-04-16 DIAGNOSIS — M5030 Other cervical disc degeneration, unspecified cervical region: Secondary | ICD-10-CM | POA: Diagnosis not present

## 2019-04-16 DIAGNOSIS — Z0000 Encounter for general adult medical examination without abnormal findings: Secondary | ICD-10-CM | POA: Diagnosis not present

## 2019-04-16 DIAGNOSIS — M810 Age-related osteoporosis without current pathological fracture: Secondary | ICD-10-CM | POA: Diagnosis not present

## 2019-05-08 DIAGNOSIS — Z23 Encounter for immunization: Secondary | ICD-10-CM | POA: Diagnosis not present

## 2019-06-04 ENCOUNTER — Encounter: Payer: Self-pay | Admitting: Gastroenterology

## 2019-06-25 ENCOUNTER — Ambulatory Visit (AMBULATORY_SURGERY_CENTER): Payer: Self-pay | Admitting: *Deleted

## 2019-06-25 ENCOUNTER — Other Ambulatory Visit: Payer: Self-pay | Admitting: *Deleted

## 2019-06-25 ENCOUNTER — Other Ambulatory Visit: Payer: Self-pay

## 2019-06-25 VITALS — Temp 96.9°F | Ht 64.5 in | Wt 138.8 lb

## 2019-06-25 DIAGNOSIS — Z1211 Encounter for screening for malignant neoplasm of colon: Secondary | ICD-10-CM

## 2019-06-25 DIAGNOSIS — Z1159 Encounter for screening for other viral diseases: Secondary | ICD-10-CM

## 2019-06-25 NOTE — Progress Notes (Signed)
No egg or soy allergy known to patient  No issues with past sedation with any surgeries  or procedures, no intubation problems  No diet pills per patient No home 02 use per patient  No blood thinners per patient  Pt denies issues with constipation  No A fib or A flutter  EMMI video sent to pt's e mail   Due to the COVID-19 pandemic we are asking patients to follow these guidelines. Please only bring one care partner. Please be aware that your care partner may wait in the car in the parking lot or if they feel like they will be too hot to wait in the car, they may wait in the lobby on the 4th floor. All care partners are required to wear a mask the entire time (we do not have any that we can provide them), they need to practice social distancing, and we will do a Covid check for all patient's and care partners when you arrive. Also we will check their temperature and your temperature. If the care partner waits in their car they need to stay in the parking lot the entire time and we will call them on their cell phone when the patient is ready for discharge so they can bring the car to the front of the building. Also all patient's will need to wear a mask into building.  COVID SCREENING 07/06/19 9:30 AM    Paper provided for example of otc miralax prep to buy for preping for procedure.

## 2019-07-06 ENCOUNTER — Other Ambulatory Visit: Payer: Self-pay

## 2019-07-06 DIAGNOSIS — Z1159 Encounter for screening for other viral diseases: Secondary | ICD-10-CM

## 2019-07-06 LAB — SARS CORONAVIRUS 2 (TAT 6-24 HRS): SARS Coronavirus 2: NEGATIVE

## 2019-07-09 ENCOUNTER — Encounter: Payer: Self-pay | Admitting: Gastroenterology

## 2019-07-09 ENCOUNTER — Other Ambulatory Visit: Payer: Self-pay

## 2019-07-09 ENCOUNTER — Ambulatory Visit (AMBULATORY_SURGERY_CENTER): Payer: Medicare Other | Admitting: Gastroenterology

## 2019-07-09 VITALS — BP 118/67 | HR 79 | Temp 98.3°F | Resp 13 | Ht 64.5 in | Wt 139.0 lb

## 2019-07-09 DIAGNOSIS — Z1211 Encounter for screening for malignant neoplasm of colon: Secondary | ICD-10-CM

## 2019-07-09 MED ORDER — SODIUM CHLORIDE 0.9 % IV SOLN: 500.0000 mL | Freq: Once | INTRAVENOUS | Status: AC

## 2019-07-09 NOTE — Op Note (Signed)
[facility] Patient Name: Brenda Gray Procedure Date: 07/09/2019 2:19 PM MRN: [id number] Endoscopist: Thornton Park MD, MD Age: 58 Referring MD:  Date of Birth: 13-Jun-1961 Gender: Female Account #: [id number] Procedure:                Colonoscopy Indications:              Screening for colorectal malignant neoplasm                           Normal screening colonoscopy with Dr. Deatra Ina in 2010                           Mother with colon polyps Medicines:                Monitored Anesthesia Care Procedure:                Pre-Anesthesia Assessment:                           - Prior to the procedure, a History and Physical                            was performed, and patient medications and                            allergies were reviewed. The patient's tolerance of                            previous anesthesia was also reviewed. The risks                            and benefits of the procedure and the sedation                            options and risks were discussed with the patient.                            All questions were answered, and informed consent                            was obtained. Prior Anticoagulants: The patient has                            taken no previous anticoagulant or antiplatelet                            agents. ASA Grade Assessment: II - A patient with                            mild systemic disease. After reviewing the risks                            and benefits, the patient was deemed in  satisfactory condition to undergo the procedure.                           After obtaining informed consent, the colonoscope                            was passed under direct vision. Throughout the                            procedure, the patient's blood pressure, pulse, and                            oxygen saturations were monitored continuously. The                            Colonoscope was introduced  through the anus and                            advanced to the the terminal ileum, with                            identification of the appendiceal orifice and IC                            valve. A second forward view of the right colon was                            performed. The colonoscopy was performed without                            difficulty. The patient tolerated the procedure                            well. The quality of the bowel preparation was                            good. The terminal ileum, ileocecal valve,                            appendiceal orifice, and rectum were photographed. Scope In: 2:27:10 PM Scope Out: 2:44:04 PM Scope Withdrawal Time: 0 hours 12 minutes 33 seconds  Total Procedure Duration: 0 hours 16 minutes 54 seconds  Findings:                 The perianal and digital rectal examinations were                            normal.                           Multiple small and large-mouthed diverticula were                            found in the sigmoid colon and descending colon.  The exam was otherwise without abnormality on                            direct and retroflexion views. Complications:            No immediate complications. Estimated Blood Loss:     Estimated blood loss: none. Impression:               - Diverticulosis in the sigmoid colon and in the                            descending colon.                           - The examination was otherwise normal on direct                            and retroflexion views.                           - No specimens collected. Recommendation:           - Patient has a contact number available for                            emergencies. The signs and symptoms of potential                            delayed complications were discussed with the                            patient. Return to normal activities tomorrow.                            Written discharge  instructions were provided to the                            patient.                           - High fiber diet. Drink at least 64 ounces of                            water daily. Consider taking a daily stool bulking                            agent such as Metamucil, Benefiber, or Citrucel.                           - Continue present medications.                           - Repeat colonoscopy in 10 years for screening                            purposes. I would recommend repeating the  colonoscopy in 5 years if your Mom's polyps were                            precancerous (adenomas or serrated polyps) and if                            they were diagnosed before age 8. Thornton Park MD, MD 07/09/2019 2:50:41 PM This report has been signed electronically.

## 2019-07-09 NOTE — Progress Notes (Signed)
PT taken to PACU. Monitors in place. VSS. Report given to RN. 

## 2019-07-09 NOTE — Progress Notes (Signed)
Vitals-CW Temp-JB  Covid negativePt's states no medical or surgical changes since previsit or office visit.

## 2019-07-09 NOTE — Progress Notes (Signed)
No problems noted in the recovery room. maw 

## 2019-07-09 NOTE — Patient Instructions (Addendum)
I did not find any polyps on your colonoscopy today.  You have diverticulosis although there was no evidence for diverticulitis.  I recommend that you continue to follow a high-fiber diet, drink plenty of fluids, and considering taking a stool bulking agent like Metamucil every day.  Thank you for confirming your Mom's history of colon polyps. Given her age at the time of diagnosis, you are not at high risk for colon cancer or polyps.   I recommend that you have another colonoscopy in 10 years.   Thank you for putting your trust in me today.  Please let me know if you have any new problems prior that time.    YOU HAD AN ENDOSCOPIC PROCEDURE TODAY AT [facility]:   Refer to the procedure report that was given to you for any specific questions about what was found during the examination.  If the procedure report does not answer your questions, please call your gastroenterologist to clarify.  If you requested that your care partner not be given the details of your procedure findings, then the procedure report has been included in a sealed envelope for you to review at your convenience later.  YOU SHOULD EXPECT: Some feelings of bloating in the abdomen. Passage of more gas than usual.  Walking can help get rid of the air that was put into your GI tract during the procedure and reduce the bloating. If you had a lower endoscopy (such as a colonoscopy or flexible sigmoidoscopy) you may notice spotting of blood in your stool or on the toilet paper. If you underwent a bowel prep for your procedure, you may not have a normal bowel movement for a few days.  Please Note:  You might notice some irritation and congestion in your nose or some drainage.  This is from the oxygen used during your procedure.  There is no need for concern and it should clear up in a day or so.  SYMPTOMS TO REPORT IMMEDIATELY:   Following lower endoscopy (colonoscopy or flexible sigmoidoscopy):  Excessive amounts  of blood in the stool  Significant tenderness or worsening of abdominal pains  Swelling of the abdomen that is new, acute  Fever of 100F or higher    For urgent or emergent issues, a gastroenterologist can be reached at any hour by calling [phone].   DIET:  We do recommend a small meal at first, but then you may proceed to your regular diet.  Drink plenty of fluids but you should avoid alcoholic beverages for 24 hours.  ACTIVITY:  You should plan to take it easy for the rest of today and you should NOT DRIVE or use heavy machinery until tomorrow (because of the sedation medicines used during the test).    FOLLOW UP: Our staff will call the number listed on your records 48-72 hours following your procedure to check on you and address any questions or concerns that you may have regarding the information given to you following your procedure. If we do not reach you, we will leave a message.  We will attempt to reach you two times.  During this call, we will ask if you have developed any symptoms of COVID 19. If you develop any symptoms (ie: fever, flu-like symptoms, shortness of breath, cough etc.) before then, please call [phone].  If you test positive for Covid 19 in the 2 weeks post procedure, please call and report this information to [place].    If any biopsies were taken you  will be contacted by phone or by letter within the next 1-3 weeks.  Please call us at [phone] if you have not heard about the biopsies in 3 weeks.    SIGNATURES/CONFIDENTIALITY: You and/or your care partner have signed paperwork which will be entered into your electronic medical record.  These signatures attest to the fact that that the information above on your After Visit Summary has been reviewed and is understood.  Full responsibility of the confidentiality of this discharge information lies with you and/or your care-partner.   Handouts were given to your care partner on diverticulosis and a high  fiber diet with liberal fluid intake. Drink at least 64 oz of water daily.  Consider taking a daily stool bulking agent such as METAMUCIL, BENEFIBER, of CITRUCEL.   You may resume your current medications today. Repeat colonoscopy in 10 yr for screening.  If your mother polyps were pre-cancerous, then repeat colonoscopy in 5 yrs. Please call if any questions or concerns.

## 2019-07-13 ENCOUNTER — Telehealth: Payer: Self-pay

## 2019-07-13 NOTE — Telephone Encounter (Signed)
  Follow up Call-  Call back number 07/09/2019 08/05/2018  Post procedure Call Back phone  # [phone]  Permission to leave phone message Yes Yes  Some recent data might be hidden     Patient questions:  Do you have a fever, pain , or abdominal swelling? No. Pain Score  0 *  Have you tolerated food without any problems? Yes.    Have you been able to return to your normal activities? Yes.    Do you have any questions about your discharge instructions: Diet   No. Medications  No. Follow up visit  No.  Do you have questions or concerns about your Care? No.  Actions: * If pain score is 4 or above: No action needed, pain <4. 1. Have you developed a fever since your procedure?  2.   Have you had an respiratory symptoms (SOB or cough) since your procedure? no  3.   Have you tested positive for COVID 19 since your procedure no  4.   Have you had any family members/close contacts diagnosed with the COVID 19 since your procedure?  no   If yes to any of these questions please route to Joylene John, RN and Alphonsa Gin, Therapist, sports.

## 2019-07-20 NOTE — Progress Notes (Deleted)
Office Visit Note  Patient: Brenda Gray             Date of Birth: [birth year]           MRN: [id number]             PCP: Leanna Battles, MD Referring: Leanna Battles, MD Visit Date: 08/03/2019 Occupation: @GUAROCC @  Subjective:  No chief complaint on file.   History of Present Illness: Brenda Gray is a 58 y.o. female ***   Activities of Daily Living:  Patient reports morning stiffness for *** {minute/hour:19697}.   Patient {ACTIONS;DENIES/REPORTS:21021675::"Denies"} nocturnal pain.  Difficulty dressing/grooming: {ACTIONS;DENIES/REPORTS:21021675::"Denies"} Difficulty climbing stairs: {ACTIONS;DENIES/REPORTS:21021675::"Denies"} Difficulty getting out of chair: {ACTIONS;DENIES/REPORTS:21021675::"Denies"} Difficulty using hands for taps, buttons, cutlery, and/or writing: {ACTIONS;DENIES/REPORTS:21021675::"Denies"}  No Rheumatology ROS completed.   PMFS History:  Patient Active Problem List   Diagnosis Date Noted  . Atypical chest pain 06/04/2017  . S/P rotator cuff repair 11/04/2016  . Dyslipidemia 11/04/2016  . History of peptic ulcer disease 11/04/2016  . Interstitial cystitis 11/04/2016  . DJD (degenerative joint disease), cervical 10/29/2016  . Spondylosis of lumbar region without myelopathy or radiculopathy 10/29/2016  . Primary osteoarthritis of both knees 10/29/2016  . 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 Primary insomnia 10/29/2016  . History of recurrent cystitis 10/29/2016  . Primary osteoarthritis of both hands 10/29/2016  . Morton neuroma, right 10/15/2016  . DYSPHAGIA UNSPECIFIED 07/31/2010  . ABDOMINAL PAIN, EPIGASTRIC 07/31/2010    Past Medical History:  Diagnosis Date  . Arthritis   . Atypical chest pain 06/04/2017  . Cataract    bilateral-removed 2 yrs ago 06/25/19  . Chronic headache S/P CERVICAL FUSION'S  . Cystitis, interstitial    frequency/urgency/nocturia  . DDD (degenerative disc disease), cervical   .  Dry eyes   . Frequency of urination   . H/O: upper GI bleed   . Hyperlipidemia   . Mild acid reflux OCCASIONAL--  WATCHES DIET  . Nocturia   . Numbness s/p cerival fusion   right side pain and numbness   . Osteopenia   . Pulmonary nodule noted in 2010   right upper lobe -- stable (monitored by pcp)  . Status post dilation of esophageal narrowing 2011  . Urge incontinence of urine     Family History  Problem Relation Age of Onset  . Hypertension Mother   . Heart failure Mother   . Colon polyps Mother   . Other Father        had to have esogasus dilated  . Heart disease Brother   . Stroke Maternal Grandmother   . Hypertension Brother   . Heart disease Brother   . Healthy Son   . Esophageal cancer Neg Hx   . Colon cancer Neg Hx   . Rectal cancer Neg Hx   . Stomach cancer Neg Hx    Past Surgical History:  Procedure Laterality Date  . CATARACT EXTRACTION, BILATERAL  07/2017   with lens implants  . CERVICAL DISKECTOMY AND FUSION  05-14-2002   C5 - 7  . COLONOSCOPY    . CYSTO WITH HYDRODISTENSION  07/19/2011   Procedure: CYSTOSCOPY/HYDRODISTENSION;  Surgeon: Malka So;  Location: [place];  Service: Urology;  Laterality: N/A;  CYSTOSCOPY WITH HYDORDISTENTION OF BLADDER AND INSTILLATION OF MARCAINE AND PYRIDIUM  . cysto/ hod  03-10-2009   [facility]  . CYSTOSCOPY N/A 05/01/2013   Procedure: CYSTOSCOPY;  Surgeon: Reece Packer, MD;  Location: [facility];  Service: Urology;  Laterality: N/A;  . CYSTOSCOPY WITH INJECTION  06/17/2012   Procedure: CYSTOSCOPY WITH INJECTION;  Surgeon: Reece Packer, MD;  Location: [place];  Service: Urology;  Laterality: N/A;  BOTOX  . CYSTOSCOPY WITH INJECTION N/A 12/08/2012   Procedure: CYSTOSCOPY WITH BOTOX INJECTION;  Surgeon: Reece Packer, MD;  Location: [place];  Service: Urology;  Laterality: N/A;  . laparoscopy ovarian cystectomy  2000 (APPROX)  . left rotator  cuff repair  OCT 2009  . POSTERIOR FUSION CERVICAL SPINE  06-29-2009   C4 - 5  . PUBOVAGINAL SLING N/A 05/01/2013   Procedure:  Lowella Dell;  Surgeon: Reece Packer, MD;  Location: [facility];  Service: Urology;  Laterality: N/A;  . ROTATOR CUFF REPAIR  2001   RIGHT SHOULDER  . TONSILLECTOMY  1981  . VAGINAL HYSTERECTOMY  1992   Social History   Social History Narrative  . Not on file   Immunization History  Administered Date(s) Administered  . Tdap 02/01/2019     Objective: Vital Signs: There were no vitals taken for this visit.   Physical Exam   Musculoskeletal Exam: ***  CDAI Exam: CDAI Score: - Patient Global: -; Provider Global: - Swollen: -; Tender: - Joint Exam   No joint exam has been documented for this visit   There is currently no information documented on the homunculus. Go to the Rheumatology activity and complete the homunculus joint exam.  Investigation: No additional findings.  Imaging: No results found.  Recent Labs: Lab Results  Component Value Date   WBC 5.3 11/12/2017   HGB 13.6 11/12/2017   PLT 250 11/12/2017   NA 142 12/17/2017   K 4.7 12/17/2017   CL 107 12/17/2017   CO2 31 12/17/2017   GLUCOSE 89 12/17/2017   BUN 15 12/17/2017   CREATININE 0.77 12/17/2017   BILITOT 0.4 12/17/2017   ALKPHOS 65 11/05/2016   AST 25 12/17/2017   ALT 17 12/17/2017   PROT 6.5 12/17/2017   ALBUMIN 4.3 11/05/2016   CALCIUM 9.6 12/17/2017   GFRAA 98 12/17/2017    Speciality Comments: Dexa: 09/08/18 Osteopenia T-Score -2.2 BMD 0.800 Hold reclast  Procedures:  No procedures performed Allergies: Cymbalta [duloxetine hcl] and Lyrica [pregabalin]   Assessment / Plan:     Visit Diagnoses: No diagnosis found.  Orders: No orders of the defined types were placed in this encounter.  No orders of the defined types were placed in this encounter.   Face-to-face time spent with patient was *** minutes. Greater than 50% of time was  spent in counseling and coordination of care.  Follow-Up Instructions: No follow-ups on file.   Earnestine Mealing, CMA  Note - This record has been created using Editor, commissioning.  Chart creation errors have been sought, but may not always  have been located. Such creation errors do not reflect on  the standard of medical care.

## 2019-08-03 ENCOUNTER — Ambulatory Visit: Payer: Medicare Other | Admitting: Rheumatology

## 2019-08-10 ENCOUNTER — Telehealth: Payer: Self-pay | Admitting: Rheumatology

## 2019-08-10 NOTE — Telephone Encounter (Signed)
Patient called requesting prescription refill of Tizanidine to be sent to [facility] at [place].  Patient was told it would be sent on Wednesday, 08/12/19.

## 2019-08-10 NOTE — Telephone Encounter (Signed)
No recent labs Last RF 03/11/2019 Last appt 01/30/2019 Next appt 09/28/2019

## 2019-08-11 MED ORDER — TIZANIDINE HCL 4 MG PO TABS: 4.0000 mg | ORAL_TABLET | Freq: Every evening | ORAL | 2 refills | Status: DC | PRN

## 2019-08-11 NOTE — Telephone Encounter (Signed)
Ok to refill 

## 2019-08-11 NOTE — Telephone Encounter (Signed)
Prescription  Has been sent.

## 2019-08-11 NOTE — Telephone Encounter (Signed)
Please contact patient. Thank you. 

## 2019-09-14 DIAGNOSIS — Z1231 Encounter for screening mammogram for malignant neoplasm of breast: Secondary | ICD-10-CM | POA: Diagnosis not present

## 2019-09-24 DIAGNOSIS — D2230 Melanocytic nevi of unspecified part of face: Secondary | ICD-10-CM | POA: Diagnosis not present

## 2019-09-24 DIAGNOSIS — D2271 Melanocytic nevi of right lower limb, including hip: Secondary | ICD-10-CM | POA: Diagnosis not present

## 2019-09-24 DIAGNOSIS — Z85828 Personal history of other malignant neoplasm of skin: Secondary | ICD-10-CM | POA: Diagnosis not present

## 2019-09-24 DIAGNOSIS — D225 Melanocytic nevi of trunk: Secondary | ICD-10-CM | POA: Diagnosis not present

## 2019-09-28 ENCOUNTER — Ambulatory Visit: Payer: Medicare Other | Admitting: Rheumatology

## 2019-09-30 NOTE — Progress Notes (Deleted)
Office Visit Note  Patient: Brenda Gray             Date of Birth: 12-Apr-1961           MRN: [id number]             PCP: Leanna Battles, MD Referring: Leanna Battles, MD Visit Date: 10/06/2019 Occupation: @GUAROCC @  Subjective:  No chief complaint on file.   History of Present Illness: Brenda Gray is a 59 y.o. female ***   Activities of Daily Living:  Patient reports morning stiffness for *** {minute/hour:19697}.   Patient {ACTIONS;DENIES/REPORTS:21021675::"Denies"} nocturnal pain.  Difficulty dressing/grooming: {ACTIONS;DENIES/REPORTS:21021675::"Denies"} Difficulty climbing stairs: {ACTIONS;DENIES/REPORTS:21021675::"Denies"} Difficulty getting out of chair: {ACTIONS;DENIES/REPORTS:21021675::"Denies"} Difficulty using hands for taps, buttons, cutlery, and/or writing: {ACTIONS;DENIES/REPORTS:21021675::"Denies"}  No Rheumatology ROS completed.   PMFS History:  Patient Active Problem List   Diagnosis Date Noted  . Atypical chest pain 06/04/2017  . S/P rotator cuff repair 11/04/2016  . Dyslipidemia 11/04/2016  . History of peptic ulcer disease 11/04/2016  . Interstitial cystitis 11/04/2016  . DJD (degenerative joint disease), cervical 10/29/2016  . Spondylosis of lumbar region without myelopathy or radiculopathy 10/29/2016  . Primary osteoarthritis of both knees 10/29/2016  . 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 Primary insomnia 10/29/2016  . History of recurrent cystitis 10/29/2016  . Primary osteoarthritis of both hands 10/29/2016  . Morton neuroma, right 10/15/2016  . DYSPHAGIA UNSPECIFIED 07/31/2010  . ABDOMINAL PAIN, EPIGASTRIC 07/31/2010    Past Medical History:  Diagnosis Date  . Arthritis   . Atypical chest pain 06/04/2017  . Cataract    bilateral-removed 2 yrs ago 06/25/19  . Chronic headache S/P CERVICAL FUSION'S  . Cystitis, interstitial    frequency/urgency/nocturia  . DDD (degenerative disc disease), cervical   .  Dry eyes   . Frequency of urination   . H/O: upper GI bleed   . Hyperlipidemia   . Mild acid reflux OCCASIONAL--  WATCHES DIET  . Nocturia   . Numbness s/p cerival fusion   right side pain and numbness   . Osteopenia   . Pulmonary nodule noted in 2010   right upper lobe -- stable (monitored by pcp)  . Status post dilation of esophageal narrowing 2011  . Urge incontinence of urine     Family History  Problem Relation Age of Onset  . Hypertension Mother   . Heart failure Mother   . Colon polyps Mother   . Other Father        had to have esogasus dilated  . Heart disease Brother   . Stroke Maternal Grandmother   . Hypertension Brother   . Heart disease Brother   . Healthy Son   . Esophageal cancer Neg Hx   . Colon cancer Neg Hx   . Rectal cancer Neg Hx   . Stomach cancer Neg Hx    Past Surgical History:  Procedure Laterality Date  . CATARACT EXTRACTION, BILATERAL  07/2017   with lens implants  . CERVICAL DISKECTOMY AND FUSION  05-14-2002   C5 - 7  . COLONOSCOPY    . CYSTO WITH HYDRODISTENSION  07/19/2011   Procedure: CYSTOSCOPY/HYDRODISTENSION;  Surgeon: Malka So;  Location: [place];  Service: Urology;  Laterality: N/A;  CYSTOSCOPY WITH HYDORDISTENTION OF BLADDER AND INSTILLATION OF MARCAINE AND PYRIDIUM  . cysto/ hod  03-10-2009   [facility]  . CYSTOSCOPY N/A 05/01/2013   Procedure: CYSTOSCOPY;  Surgeon: Reece Packer, MD;  Location[facility];  Service: Urology;  Laterality: N/A;  . CYSTOSCOPY WITH INJECTION  06/17/2012   Procedure: CYSTOSCOPY WITH INJECTION;  Surgeon: Reece Packer, MD;  Location: [place];  Service: Urology;  Laterality: N/A;  BOTOX  . CYSTOSCOPY WITH INJECTION N/A 12/08/2012   Procedure: CYSTOSCOPY WITH BOTOX INJECTION;  Surgeon: Reece Packer, MD;  Location: [place];  Service: Urology;  Laterality: N/A;  . laparoscopy ovarian cystectomy  2000 ([facility]PROX)  . left rotator  cuff repair  OCT 2009  . POSTERIOR FUSION CERVICAL SPINE  06-29-2009   C4 - 5  . PUBOVAGINAL SLING N/A 05/01/2013   Procedure:  Lowella Dell;  Surgeon: Reece Packer, MD;  [facility];  Service: Urology;  Laterality: N/A;  . ROTATOR CUFF REPAIR  2001   RIGHT SHOULDER  . TONSILLECTOMY  1981  . VAGINAL HYSTERECTOMY  1992   Social History   Social History Narrative  . Not on file   Immunization History  Administered Date(s) Administered  . Tdap 02/01/2019     Objective: Vital Signs: There were no vitals taken for this visit.   Physical Exam   Musculoskeletal Exam: ***  CDAI Exam: CDAI Score: -- Patient Global: --; Provider Global: -- Swollen: --; Tender: -- Joint Exam 10/06/2019   No joint exam has been documented for this visit   There is currently no information documented on the homunculus. Go to the Rheumatology activity and complete the homunculus joint exam.  Investigation: No additional findings.  Imaging: No results found.  Recent Labs: Lab Results  Component Value Date   WBC 5.3 11/12/2017   HGB 13.6 11/12/2017   PLT 250 11/12/2017   NA 142 12/17/2017   K 4.7 12/17/2017   CL 107 12/17/2017   CO2 31 12/17/2017   GLUCOSE 89 12/17/2017   BUN 15 12/17/2017   CREATININE 0.77 12/17/2017   BILITOT 0.4 12/17/2017   ALKPHOS 65 11/05/2016   AST 25 12/17/2017   ALT 17 12/17/2017   PROT 6.5 12/17/2017   ALBUMIN 4.3 11/05/2016   CALCIUM 9.6 12/17/2017   GFRAA 98 12/17/2017    Speciality Comments: Dexa: 09/08/18 Osteopenia T-Score -2.2 BMD 0.800 Hold reclast  Procedures:  No procedures performed Allergies: Cymbalta [duloxetine hcl] and Lyrica [pregabalin]   Assessment / Plan:     Visit Diagnoses: No diagnosis found.  Orders: No orders of the defined types were placed in this encounter.  No orders of the defined types were placed in this encounter.   Face-to-face time spent with patient was *** minutes. Greater than  50% of time was spent in counseling and coordination of care.  Follow-Up Instructions: No follow-ups on file.   Ofilia Neas, PA-C  Note - This record has been created using Dragon software.  Chart creation errors have been sought, but may not always  have been located. Such creation errors do not reflect on  the standard of medical care.

## 2019-09-30 NOTE — Progress Notes (Signed)
Office Visit Note  Patient: Brenda Gray             Date of Birth: [birth year]           MRN: [id number]             PCP: Leanna Battles, MD Referring: Leanna Battles, MD Visit Date: 10/01/2019 Occupation: @GUAROCC @  Subjective:  Left shoulder joint pain   History of Present Illness: Brenda Gray is a 59 y.o. female with history of osteoarthritis, DDD, and osteoporosis.  She presents today with left shoulder joint pain.  She requests a left shoulder joint cortisone injection. Her last subacromial bursa cortisone injection was on 01/30/19. She has been experiencing pain in both knee joints and both trochanteric bursa. She states she has been less active due to being unable to go to the senior center to exercise daily. She received IV reclast infusions once yearly x2 years and takes calcium and vitamin D supplements.  She is currently holding reclast until she updates her DEXA.  Activities of Daily Living:  Patient reports morning stiffness for 5-10 minutes.   Patient Reports nocturnal pain.  Difficulty dressing/grooming: Denies Difficulty climbing stairs: Denies Difficulty getting out of chair: Denies Difficulty using hands for taps, buttons, cutlery, and/or writing: Reports  Review of Systems  Constitutional: Negative for fatigue.  HENT: Negative for mouth sores, mouth dryness and nose dryness.   Eyes: Positive for dryness. Negative for pain and itching.  Respiratory: Negative for shortness of breath, wheezing and difficulty breathing.   Cardiovascular: Negative for chest pain and swelling in legs/feet.  Gastrointestinal: Negative for blood in stool, constipation and diarrhea.  Endocrine: Negative for increased urination.  Genitourinary: Negative for difficulty urinating and painful urination.  Musculoskeletal: Positive for arthralgias, joint pain and morning stiffness. Negative for joint swelling.  Skin: Negative for rash and hair loss.  Allergic/Immunologic:  Negative for susceptible to infections.  Neurological: Negative for dizziness, light-headedness, headaches, memory loss and weakness.  Hematological: Negative for bruising/bleeding tendency.  Psychiatric/Behavioral: Negative for confusion and sleep disturbance.    PMFS History:  Patient Active Problem List   Diagnosis Date Noted  . Atypical chest pain 06/04/2017  . S/P rotator cuff repair 11/04/2016  . Dyslipidemia 11/04/2016  . History of peptic ulcer disease 11/04/2016  . Interstitial cystitis 11/04/2016  . DJD (degenerative joint disease), cervical 10/29/2016  . Spondylosis of lumbar region without myelopathy or radiculopathy 10/29/2016  . Primary osteoarthritis of both knees 10/29/2016  . 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 Primary insomnia 10/29/2016  . History of recurrent cystitis 10/29/2016  . Primary osteoarthritis of both hands 10/29/2016  . Morton neuroma, right 10/15/2016  . DYSPHAGIA UNSPECIFIED 07/31/2010  . ABDOMINAL PAIN, EPIGASTRIC 07/31/2010    Past Medical History:  Diagnosis Date  . Arthritis   . Atypical chest pain 06/04/2017  . Cataract    bilateral-removed 2 yrs ago 06/25/19  . Chronic headache S/P CERVICAL FUSION'S  . Cystitis, interstitial    frequency/urgency/nocturia  . DDD (degenerative disc disease), cervical   . Dry eyes   . Frequency of urination   . H/O: upper GI bleed   . Hyperlipidemia   . Mild acid reflux OCCASIONAL--  WATCHES DIET  . Nocturia   . Numbness s/p cerival fusion   right side pain and numbness   . Osteopenia   . Pulmonary nodule noted in 2010   right upper lobe -- stable (monitored by pcp)  . Status post dilation of esophageal  narrowing 2011  . Urge incontinence of urine     Family History  Problem Relation Age of Onset  . Hypertension Mother   . Heart failure Mother   . Colon polyps Mother   . Other Father        had to have esogasus dilated  . Heart disease Brother   . Stroke Maternal  Grandmother   . Hypertension Brother   . Heart disease Brother   . Healthy Son   . Esophageal cancer Neg Hx   . Colon cancer Neg Hx   . Rectal cancer Neg Hx   . Stomach cancer Neg Hx    Past Surgical History:  Procedure Laterality Date  . CATARACT EXTRACTION, BILATERAL  07/2017   with lens implants  . CERVICAL DISKECTOMY AND FUSION  05-14-2002   C5 - 7  . COLONOSCOPY    . CYSTO WITH HYDRODISTENSION  07/19/2011   Procedure: CYSTOSCOPY/HYDRODISTENSION;  Surgeon: Malka So;  Location: [place];  Service: Urology;  Laterality: N/A;  CYSTOSCOPY WITH HYDORDISTENTION OF BLADDER AND INSTILLATION OF MARCAINE AND PYRIDIUM  . cysto/ hod  03-10-2009   [facility]  . CYSTOSCOPY N/A 05/01/2013   Procedure: CYSTOSCOPY;  Surgeon: Reece Packer, MD;  Location: [facility];  Service: Urology;  Laterality: N/A;  . CYSTOSCOPY WITH INJECTION  06/17/2012   Procedure: CYSTOSCOPY WITH INJECTION;  Surgeon: Reece Packer, MD;  Location: [place];  Service: Urology;  Laterality: N/A;  BOTOX  . CYSTOSCOPY WITH INJECTION N/A 12/08/2012   Procedure: CYSTOSCOPY WITH BOTOX INJECTION;  Surgeon: Reece Packer, MD;  Location: [place];  Service: Urology;  Laterality: N/A;  . laparoscopy ovarian cystectomy  2000 (APPROX)  . left rotator cuff repair  OCT 2009  . POSTERIOR FUSION CERVICAL SPINE  06-29-2009   C4 - 5  . PUBOVAGINAL SLING N/A 05/01/2013   Procedure:  Lowella Dell;  Surgeon: Reece Packer, MD;  Location: [facility];  Service: Urology;  Laterality: N/A;  . ROTATOR CUFF REPAIR  2001   RIGHT SHOULDER  . TONSILLECTOMY  1981  . VAGINAL HYSTERECTOMY  1992   Social History   Social History Narrative  . Not on file   Immunization History  Administered Date(s) Administered  . Tdap 02/01/2019     Objective: Vital Signs: BP 130/78 (BP Location: Right Arm, Patient Position: Sitting, Cuff Size: Normal)   Pulse  70   Resp 12   Ht 5' 4.5" (1.638 m)   Wt 143 lb (64.9 kg)   BMI 24.17 kg/m    Physical Exam Vitals and nursing note reviewed.  Constitutional:      Appearance: She is well-developed.  HENT:     Head: Normocephalic and atraumatic.  Eyes:     Conjunctiva/sclera: Conjunctivae normal.  Cardiovascular:     Rate and Rhythm: Normal rate and regular rhythm.     Heart sounds: Normal heart sounds.  Pulmonary:     Effort: Pulmonary effort is normal.     Breath sounds: Normal breath sounds.  Abdominal:     General: Bowel sounds are normal.     Palpations: Abdomen is soft.  Musculoskeletal:     Cervical back: Normal range of motion.  Lymphadenopathy:     Cervical: No cervical adenopathy.  Skin:    General: Skin is warm and dry.     Capillary Refill: Capillary refill takes less than 2 seconds.  Neurological:     Mental  Status: She is alert and oriented to person, place, and time.  Psychiatric:        Behavior: Behavior normal.      Musculoskeletal Exam: C-spine limited ROM with lateral rotation.  Thoracic and lumbar spine good ROM. Left shoulder discomfort with abduction. Tenderness over left AC joint. Shoulder joints, elbow joints, wrist joints, MCPs, PIPs, and DIPs good ROM with no synovitis.  PIP and DIP synovial thickening consistent with osteoarthritis of both hands.  Hip joints, knee joints, ankle joints, MTPs, PIPs, and DIPs good ROM with no synovitis.  No warmth or effusion of knee joints.  No tenderness or swelling of ankle joints.  Tenderness over bilateral trochanteric bursitis    CDAI Exam: CDAI Score: -- Patient Global: --; Provider Global: -- Swollen: --; Tender: -- Joint Exam 10/01/2019   No joint exam has been documented for this visit   There is currently no information documented on the homunculus. Go to the Rheumatology activity and complete the homunculus joint exam.  Investigation: No additional findings.  Imaging: No results found.  Recent Labs: Lab  Results  Component Value Date   WBC 5.3 11/12/2017   HGB 13.6 11/12/2017   PLT 250 11/12/2017   NA 142 12/17/2017   K 4.7 12/17/2017   CL 107 12/17/2017   CO2 31 12/17/2017   GLUCOSE 89 12/17/2017   BUN 15 12/17/2017   CREATININE 0.77 12/17/2017   BILITOT 0.4 12/17/2017   ALKPHOS 65 11/05/2016   AST 25 12/17/2017   ALT 17 12/17/2017   PROT 6.5 12/17/2017   ALBUMIN 4.3 11/05/2016   CALCIUM 9.6 12/17/2017   GFRAA 98 12/17/2017    Speciality Comments: Dexa: 09/08/18 Osteopenia T-Score -2.2 BMD 0.800 Hold reclast  Procedures:  Large Joint Inj: L glenohumeral on 10/01/2019 10:49 AM Indications: pain Details: 27 G 1.5 in needle, posterior approach  Arthrogram: No  Medications: 1.5 mL lidocaine 1 %; 40 mg triamcinolone acetonide 40 MG/ML Aspirate: 0 mL Outcome: tolerated well, no immediate complications Procedure, treatment alternatives, risks and benefits explained, specific risks discussed. Consent was given by the patient. Immediately prior to procedure a time out was called to verify the correct patient, procedure, equipment, support staff and site/side marked as required. Patient was prepped and draped in the usual sterile fashion.     Allergies: Cymbalta [duloxetine hcl] and Lyrica [pregabalin]   Assessment / Plan:     Visit Diagnoses: Primary osteoarthritis of both hands: She has PIP and DIP synovial thickening consistent with osteoarthritis of both hands. No tenderness or synovitis noted.  Complete fist formation bilaterally.  Joint protection and muscle strengthening were discussed.  She will follow up in 6 months.   Primary osteoarthritis of both knees: She has been experiencing increased pain in both knee joints.  She has good ROM with no discomfort.  No warmth or effusion of knee joints noted.  She has been more sedentary recently, which she feels has exacerbated her discomfort.   S/P rotator cuff repair -She presents today with left shoulder joint pain. She had a  left shoulder subacromial cortisone injection on 01/30/2019.  She requested a repeat injection today.  She was given a handout of shoulder joint exercises.  Chronic left shoulder pain: She presents today with acute on chronic left shoulder joint pain.  She has discomfort with abduction and tenderness over the left AC joint.  She previously had rotator cuff repaired by Dr. Marlou Sa.  She requested a left shoulder subacromial bursa injection today.  She tolerated  the procedure well.  Procedure note completed above.  Aftercare discussed. She was given a handout of shoulder joint exercises to perform.   Trochanteric bursitis of both hips: She has tenderness over bilateral trochanteric bursa.  She has been more sedentary over the past several months due to being unable to exercise at the senior center.  She was given a handout of shoulder joint exercises.  A few exercises were demonstrated today in the office.   Age-related osteoporosis without current pathological fracture - Dexa scan on 09/08/18 showed T-score of -2.2 at [facility]. Previously treated with Reclast and last infusion April 2019.  She is taking calcium and vitamin D supplements.   DDD (degenerative disc disease), cervical-She has limited ROM with lateral rotation.  She has good flexion and extension of the C-spine.  No symptoms of radiculopathy.   DDD (degenerative disc disease), thoracic: No midline spinal tenderness.    DDD (degenerative disc disease), lumbar: She is not having any lower back pain currently.    Other medical conditions are listed as follows:   Interstitial cystitis  History of peptic ulcer disease  Dyslipidemia  Orders: Orders Placed This Encounter  Procedures  . Large Joint Inj: L glenohumeral   No orders of the defined types were placed in this encounter.    Follow-Up Instructions: Return in about 6 months (around 03/30/2020) for Osteoarthritis, Osteoporosis, DDD.   Bo Merino, MD   Scribed  by-  Hazel Sams , PA-C  Note - This record has been created using Dragon software.  Chart creation errors have been sought, but may not always  have been located. Such creation errors do not reflect on  the standard of medical care.

## 2019-10-01 ENCOUNTER — Ambulatory Visit: Payer: Medicare Other | Admitting: Rheumatology

## 2019-10-01 ENCOUNTER — Other Ambulatory Visit: Payer: Self-pay

## 2019-10-01 ENCOUNTER — Encounter: Payer: Self-pay | Admitting: Rheumatology

## 2019-10-01 VITALS — BP 130/78 | HR 70 | Resp 12 | Ht 64.5 in | Wt 143.0 lb

## 2019-10-01 DIAGNOSIS — G8929 Other chronic pain: Secondary | ICD-10-CM

## 2019-10-01 DIAGNOSIS — M19041 Primary osteoarthritis, right hand: Secondary | ICD-10-CM | POA: Diagnosis not present

## 2019-10-01 DIAGNOSIS — Z9889 Other specified postprocedural states: Secondary | ICD-10-CM | POA: Diagnosis not present

## 2019-10-01 DIAGNOSIS — E785 Hyperlipidemia, unspecified: Secondary | ICD-10-CM

## 2019-10-01 DIAGNOSIS — M7062 Trochanteric bursitis, left hip: Secondary | ICD-10-CM

## 2019-10-01 DIAGNOSIS — M7061 Trochanteric bursitis, right hip: Secondary | ICD-10-CM

## 2019-10-01 DIAGNOSIS — Z8711 Personal history of peptic ulcer disease: Secondary | ICD-10-CM

## 2019-10-01 DIAGNOSIS — N3010 Interstitial cystitis (chronic) without hematuria: Secondary | ICD-10-CM

## 2019-10-01 DIAGNOSIS — M19042 Primary osteoarthritis, left hand: Secondary | ICD-10-CM

## 2019-10-01 DIAGNOSIS — M5136 Other intervertebral disc degeneration, lumbar region: Secondary | ICD-10-CM

## 2019-10-01 DIAGNOSIS — M25512 Pain in left shoulder: Secondary | ICD-10-CM

## 2019-10-01 DIAGNOSIS — M5134 Other intervertebral disc degeneration, thoracic region: Secondary | ICD-10-CM

## 2019-10-01 DIAGNOSIS — M810 Age-related osteoporosis without current pathological fracture: Secondary | ICD-10-CM

## 2019-10-01 DIAGNOSIS — M5030 Other cervical disc degeneration, unspecified cervical region: Secondary | ICD-10-CM

## 2019-10-01 DIAGNOSIS — M170 Bilateral primary osteoarthritis of knee: Secondary | ICD-10-CM

## 2019-10-01 MED ORDER — TRIAMCINOLONE ACETONIDE 40 MG/ML IJ SUSP
40.0000 mg | INTRAMUSCULAR | Status: AC | PRN
  Administered 2019-10-01: 40 mg via INTRA_ARTICULAR

## 2019-10-01 MED ORDER — LIDOCAINE HCL 1 % IJ SOLN
1.5000 mL | INTRAMUSCULAR | Status: AC | PRN
  Administered 2019-10-01: 1.5 mL

## 2019-10-01 NOTE — Patient Instructions (Addendum)
Shoulder Exercises Ask your health care provider which exercises are safe for you. Do exercises exactly as told by your health care provider and adjust them as directed. It is normal to feel mild stretching, pulling, tightness, or discomfort as you do these exercises. Stop right away if you feel sudden pain or your pain gets worse. Do not begin these exercises until told by your health care provider. Stretching exercises External rotation and abduction This exercise is sometimes called corner stretch. This exercise rotates your arm outward (external rotation) and moves your arm out from your body (abduction). 1. Stand in a doorway with one of your feet slightly in front of the other. This is called a staggered stance. If you cannot reach your forearms to the door frame, stand facing a corner of a room. 2. Choose one of the following positions as told by your health care provider: ? Place your hands and forearms on the door frame above your head. ? Place your hands and forearms on the door frame at the height of your head. ? Place your hands on the door frame at the height of your elbows. 3. Slowly move your weight onto your front foot until you feel a stretch across your chest and in the front of your shoulders. Keep your head and chest upright and keep your abdominal muscles tight. 4. Hold for __________ seconds. 5. To release the stretch, shift your weight to your back foot. Repeat __________ times. Complete this exercise __________ times a day. Extension, standing 1. Stand and hold a broomstick, a cane, or a similar object behind your back. ? Your hands should be a little wider than shoulder width apart. ? Your palms should face away from your back. 2. Keeping your elbows straight and your shoulder muscles relaxed, move the stick away from your body until you feel a stretch in your shoulders (extension). ? Avoid shrugging your shoulders while you move the stick. Keep your shoulder blades tucked  down toward the middle of your back. 3. Hold for __________ seconds. 4. Slowly return to the starting position. Repeat __________ times. Complete this exercise __________ times a day. Range-of-motion exercises Pendulum  1. Stand near a wall or a surface that you can hold onto for balance. 2. Bend at the waist and let your left / right arm hang straight down. Use your other arm to support you. Keep your back straight and do not lock your knees. 3. Relax your left / right arm and shoulder muscles, and move your hips and your trunk so your left / right arm swings freely. Your arm should swing because of the motion of your body, not because you are using your arm or shoulder muscles. 4. Keep moving your hips and trunk so your arm swings in the following directions, as told by your health care provider: ? Side to side. ? Forward and backward. ? In clockwise and counterclockwise circles. 5. Continue each motion for __________ seconds, or for as long as told by your health care provider. 6. Slowly return to the starting position. Repeat __________ times. Complete this exercise __________ times a day. Shoulder flexion, standing  1. Stand and hold a broomstick, a cane, or a similar object. Place your hands a little more than shoulder width apart on the object. Your left / right hand should be palm up, and your other hand should be palm down. 2. Keep your elbow straight and your shoulder muscles relaxed. Push the stick up with your healthy arm to   raise your left / right arm in front of your body, and then over your head until you feel a stretch in your shoulder (flexion). ? Avoid shrugging your shoulder while you raise your arm. Keep your shoulder blade tucked down toward the middle of your back. 3. Hold for __________ seconds. 4. Slowly return to the starting position. Repeat __________ times. Complete this exercise __________ times a day. Shoulder abduction, standing 1. Stand and hold a broomstick,  a cane, or a similar object. Place your hands a little more than shoulder width apart on the object. Your left / right hand should be palm up, and your other hand should be palm down. 2. Keep your elbow straight and your shoulder muscles relaxed. Push the object across your body toward your left / right side. Raise your left / right arm to the side of your body (abduction) until you feel a stretch in your shoulder. ? Do not raise your arm above shoulder height unless your health care provider tells you to do that. ? If directed, raise your arm over your head. ? Avoid shrugging your shoulder while you raise your arm. Keep your shoulder blade tucked down toward the middle of your back. 3. Hold for __________ seconds. 4. Slowly return to the starting position. Repeat __________ times. Complete this exercise __________ times a day. Internal rotation  1. Place your left / right hand behind your back, palm up. 2. Use your other hand to dangle an exercise band, a towel, or a similar object over your shoulder. Grasp the band with your left / right hand so you are holding on to both ends. 3. Gently pull up on the band until you feel a stretch in the front of your left / right shoulder. The movement of your arm toward the center of your body is called internal rotation. ? Avoid shrugging your shoulder while you raise your arm. Keep your shoulder blade tucked down toward the middle of your back. 4. Hold for __________ seconds. 5. Release the stretch by letting go of the band and lowering your hands. Repeat __________ times. Complete this exercise __________ times a day. Strengthening exercises External rotation  1. Sit in a stable chair without armrests. 2. Secure an exercise band to a stable object at elbow height on your left / right side. 3. Place a soft object, such as a folded towel or a small pillow, between your left / right upper arm and your body to move your elbow about 4 inches (10 cm) away  from your side. 4. Hold the end of the exercise band so it is tight and there is no slack. 5. Keeping your elbow pressed against the soft object, slowly move your forearm out, away from your abdomen (external rotation). Keep your body steady so only your forearm moves. 6. Hold for __________ seconds. 7. Slowly return to the starting position. Repeat __________ times. Complete this exercise __________ times a day. Shoulder abduction  1. Sit in a stable chair without armrests, or stand up. 2. Hold a __________ weight in your left / right hand, or hold an exercise band with both hands. 3. Start with your arms straight down and your left / right palm facing in, toward your body. 4. Slowly lift your left / right hand out to your side (abduction). Do not lift your hand above shoulder height unless your health care provider tells you that this is safe. ? Keep your arms straight. ? Avoid shrugging your shoulder while you   do this movement. Keep your shoulder blade tucked down toward the middle of your back. 5. Hold for __________ seconds. 6. Slowly lower your arm, and return to the starting position. Repeat __________ times. Complete this exercise __________ times a day. Shoulder extension 1. Sit in a stable chair without armrests, or stand up. 2. Secure an exercise band to a stable object in front of you so it is at shoulder height. 3. Hold one end of the exercise band in each hand. Your palms should face each other. 4. Straighten your elbows and lift your hands up to shoulder height. 5. Step back, away from the secured end of the exercise band, until the band is tight and there is no slack. 6. Squeeze your shoulder blades together as you pull your hands down to the sides of your thighs (extension). Stop when your hands are straight down by your sides. Do not let your hands go behind your body. 7. Hold for __________ seconds. 8. Slowly return to the starting position. Repeat __________ times.  Complete this exercise __________ times a day. Shoulder row 1. Sit in a stable chair without armrests, or stand up. 2. Secure an exercise band to a stable object in front of you so it is at waist height. 3. Hold one end of the exercise band in each hand. Position your palms so that your thumbs are facing the ceiling (neutral position). 4. Bend each of your elbows to a 90-degree angle (right angle) and keep your upper arms at your sides. 5. Step back until the band is tight and there is no slack. 6. Slowly pull your elbows back behind you. 7. Hold for __________ seconds. 8. Slowly return to the starting position. Repeat __________ times. Complete this exercise __________ times a day. Shoulder press-ups  1. Sit in a stable chair that has armrests. Sit upright, with your feet flat on the floor. 2. Put your hands on the armrests so your elbows are bent and your fingers are pointing forward. Your hands should be about even with the sides of your body. 3. Push down on the armrests and use your arms to lift yourself off the chair. Straighten your elbows and lift yourself up as much as you comfortably can. ? Move your shoulder blades down, and avoid letting your shoulders move up toward your ears. ? Keep your feet on the ground. As you get stronger, your feet should support less of your body weight as you lift yourself up. 4. Hold for __________ seconds. 5. Slowly lower yourself back into the chair. Repeat __________ times. Complete this exercise __________ times a day. Wall push-ups  1. Stand so you are facing a stable wall. Your feet should be about one arm-length away from the wall. 2. Lean forward and place your palms on the wall at shoulder height. 3. Keep your feet flat on the floor as you bend your elbows and lean forward toward the wall. 4. Hold for __________ seconds. 5. Straighten your elbows to push yourself back to the starting position. Repeat __________ times. Complete this exercise  __________ times a day. This information is not intended to replace advice given to you by your health care provider. Make sure you discuss any questions you have with your health care provider. Document Revised: 12/19/2018 Document Reviewed: 09/26/2018 Elsevier Patient Education  2020 [place].   Hip Bursitis Rehab Ask your health care provider which exercises are safe for you. Do exercises exactly as told by your health care provider and  adjust them as directed. It is normal to feel mild stretching, pulling, tightness, or discomfort as you do these exercises. Stop right away if you feel sudden pain or your pain gets worse. Do not begin these exercises until told by your health care provider. Stretching exercise This exercise warms up your muscles and joints and improves the movement and flexibility of your hip. This exercise also helps to relieve pain and stiffness. Iliotibial band stretch An iliotibial band is a strong band of muscle tissue that runs from the outer side of your hip to the outer side of your thigh and knee. 1. Lie on your side with your left / right leg in the top position. 2. Bend your left / right knee and grab your ankle. Stretch out your bottom arm to help you balance. 3. Slowly bring your knee back so your thigh is behind your body. 4. Slowly lower your knee toward the floor until you feel a gentle stretch on the outside of your left / right thigh. If you do not feel a stretch and your knee will not fall farther, place the heel of your other foot on top of your knee and pull your knee down toward the floor with your foot. 5. Hold this position for __________ seconds. 6. Slowly return to the starting position. Repeat __________ times. Complete this exercise __________ times a day. Strengthening exercises These exercises build strength and endurance in your hip and pelvis. Endurance is the ability to use your muscles for a long time, even after they get  tired. Bridge This exercise strengthens the muscles that move your thigh backward (hip extensors). 1. Lie on your back on a firm surface with your knees bent and your feet flat on the floor. 2. Tighten your buttocks muscles and lift your buttocks off the floor until your trunk is level with your thighs. ? Do not arch your back. ? You should feel the muscles working in your buttocks and the back of your thighs. If you do not feel these muscles, slide your feet 1-2 inches (2.5-5 cm) farther away from your buttocks. ? If this exercise is too easy, try doing it with your arms crossed over your chest. 3. Hold this position for __________ seconds. 4. Slowly lower your hips to the starting position. 5. Let your muscles relax completely after each repetition. Repeat __________ times. Complete this exercise __________ times a day. Squats This exercise strengthens the muscles in front of your thigh and knee (quadriceps). 1. Stand in front of a table, with your feet and knees pointing straight ahead. You may rest your hands on the table for balance but not for support. 2. Slowly bend your knees and lower your hips like you are going to sit in a chair. ? Keep your weight over your heels, not over your toes. ? Keep your lower legs upright so they are parallel with the table legs. ? Do not let your hips go lower than your knees. ? Do not bend lower than told by your health care provider. ? If your hip pain increases, do not bend as low. 3. Hold the squat position for __________ seconds. 4. Slowly push with your legs to return to standing. Do not use your hands to pull yourself to standing. Repeat __________ times. Complete this exercise __________ times a day. Hip hike 1. Stand sideways on a bottom step. Stand on your left / right leg with your other foot unsupported next to the step. You can hold on  to the railing or wall for balance if needed. 2. Keep your knees straight and your torso square. Then  lift your left / right hip up toward the ceiling. 3. Hold this position for __________ seconds. 4. Slowly let your left / right hip lower toward the floor, past the starting position. Your foot should get closer to the floor. Do not lean or bend your knees. Repeat __________ times. Complete this exercise __________ times a day. Single leg stand 1. Without shoes, stand near a railing or in a doorway. You may hold on to the railing or door frame as needed for balance. 2. Squeeze your left / right buttock muscles, then lift up your other foot. ? Do not let your left / right hip push out to the side. ? It is helpful to stand in front of a mirror for this exercise so you can watch your hip. 3. Hold this position for __________ seconds. Repeat __________ times. Complete this exercise __________ times a day. This information is not intended to replace advice given to you by your health care provider. Make sure you discuss any questions you have with your health care provider. Document Revised: 12/22/2018 Document Reviewed: 12/22/2018 Elsevier Patient Education  [place] Band Syndrome Rehab Ask your health care provider which exercises are safe for you. Do exercises exactly as told by your health care provider and adjust them as directed. It is normal to feel mild stretching, pulling, tightness, or discomfort as you do these exercises. Stop right away if you feel sudden pain or your pain gets significantly worse. Do not begin these exercises until told by your health care provider. Stretching and range-of-motion exercises These exercises warm up your muscles and joints and improve the movement and flexibility of your hip and pelvis. Quadriceps stretch, prone  6. Lie on your abdomen on a firm surface, such as a bed or padded floor (prone position). 7. Bend your left / right knee and reach back to hold your ankle or pant leg. If you cannot reach your ankle or pant leg, loop a  belt around your foot and grab the belt instead. 8. Gently pull your heel toward your buttocks. Your knee should not slide out to the side. You should feel a stretch in the front of your thigh and knee (quadriceps). 9. Hold this position for __________ seconds. Repeat __________ times. Complete this exercise __________ times a day. Iliotibial band stretch An iliotibial band is a strong band of muscle tissue that runs from the outer side of your hip to the outer side of your thigh and knee. 5. Lie on your side with your left / right leg in the top position. 6. Bend both of your knees and grab your left / right ankle. Stretch out your bottom arm to help you balance. 7. Slowly bring your top knee back so your thigh goes behind your trunk. 8. Slowly lower your top leg toward the floor until you feel a gentle stretch on the outside of your left / right hip and thigh. If you do not feel a stretch and your knee will not fall farther, place the heel of your other foot on top of your knee and pull your knee down toward the floor with your foot. 9. Hold this position for __________ seconds. Repeat __________ times. Complete this exercise __________ times a day. Strengthening exercises These exercises build strength and endurance in your hip and pelvis. Endurance is the ability to use your  muscles for a long time, even after they get tired. Straight leg raises, side-lying This exercise strengthens the muscles that rotate the leg at the hip and move it away from your body (hip abductors). 7. Lie on your side with your left / right leg in the top position. Lie so your head, shoulder, hip, and knee line up. You may bend your bottom knee to help you balance. 8. Roll your hips slightly forward so your hips are stacked directly over each other and your left / right knee is facing forward. 9. Tense the muscles in your outer thigh and lift your top leg 4-6 inches (10-15 cm). 10. Hold this position for __________  seconds. 11. Slowly return to the starting position. Let your muscles relax completely before doing another repetition. Repeat __________ times. Complete this exercise __________ times a day. Leg raises, prone This exercise strengthens the muscles that move the hips (hip extensors). 5. Lie on your abdomen on your bed or a firm surface. You can put a pillow under your hips if that is more comfortable for your lower back. 6. Bend your left / right knee so your foot is straight up in the air. 7. Squeeze your buttocks muscles and lift your left / right thigh off the bed. Do not let your back arch. 8. Tense your thigh muscle as hard as you can without increasing any knee pain. 9. Hold this position for __________ seconds. 10. Slowly lower your leg to the starting position and allow it to relax completely. Repeat __________ times. Complete this exercise __________ times a day. Hip hike 5. Stand sideways on a bottom step. Stand on your left / right leg with your other foot unsupported next to the step. You can hold on to the railing or wall for balance if needed. 6. Keep your knees straight and your torso square. Then lift your left / right hip up toward the ceiling. 7. Slowly let your left / right hip lower toward the floor, past the starting position. Your foot should get closer to the floor. Do not lean or bend your knees. Repeat __________ times. Complete this exercise __________ times a day. This information is not intended to replace advice given to you by your health care provider. Make sure you discuss any questions you have with your health care provider. Document Revised: 12/18/2018 Document Reviewed: 06/18/2018 Elsevier Patient Education  [place].

## 2019-10-06 ENCOUNTER — Ambulatory Visit: Payer: Medicare Other | Admitting: Rheumatology

## 2019-11-02 DIAGNOSIS — Z0120 Encounter for dental examination and cleaning without abnormal findings: Secondary | ICD-10-CM | POA: Diagnosis not present

## 2019-11-10 ENCOUNTER — Other Ambulatory Visit: Payer: Self-pay | Admitting: *Deleted

## 2019-11-10 MED ORDER — TIZANIDINE HCL 4 MG PO TABS: 4.0000 mg | ORAL_TABLET | Freq: Every evening | ORAL | 2 refills | Status: DC | PRN

## 2019-11-10 NOTE — Telephone Encounter (Signed)
Refill request received via fax  Last Visit: 10/01/19 Next Visit: 03/30/20  Okay to refill per Dr. Estanislado Pandy

## 2020-01-14 ENCOUNTER — Telehealth: Payer: Self-pay | Admitting: Physical Medicine and Rehabilitation

## 2020-01-14 NOTE — Telephone Encounter (Signed)
Patient would like to schedule an injection. Bilateral S1 TF 02/23/2019. Ok to repeat if helped, same problem/side, and no new injury?

## 2020-01-14 NOTE — Telephone Encounter (Signed)
ok 

## 2020-01-15 NOTE — Telephone Encounter (Signed)
Is auth needed? Scheduled for 6/2 with driver.

## 2020-01-15 NOTE — Telephone Encounter (Signed)
No pa is needed for [phone] per [employer] online portal and for [facility].medicare

## 2020-02-10 ENCOUNTER — Ambulatory Visit: Payer: Self-pay

## 2020-02-10 ENCOUNTER — Encounter: Payer: Self-pay | Admitting: Physical Medicine and Rehabilitation

## 2020-02-10 ENCOUNTER — Other Ambulatory Visit: Payer: Self-pay

## 2020-02-10 ENCOUNTER — Ambulatory Visit: Payer: Medicare Other | Admitting: Physical Medicine and Rehabilitation

## 2020-02-10 VITALS — BP 115/66 | HR 67

## 2020-02-10 DIAGNOSIS — M5416 Radiculopathy, lumbar region: Secondary | ICD-10-CM | POA: Diagnosis not present

## 2020-02-10 MED ORDER — METHYLPREDNISOLONE ACETATE 80 MG/ML IJ SUSP
80.0000 mg | Freq: Once | INTRAMUSCULAR | Status: AC
  Administered 2020-02-10: 80 mg

## 2020-02-10 NOTE — Progress Notes (Signed)
Numeric Pain Rating Scale and Functional Assessment Average Pain 7   In the last MONTH (on 0-10 scale) has pain interfered with the following?  1. General activity like being  able to carry out your everyday physical activities such as walking, climbing stairs, carrying groceries, or moving a chair?  Rating(5)  Pain is in lower back and pain radiates into both legs, but left is the worse.  Numbness/ tingling is worse at night.  Walking helps.  +Driver, -BT, -Dye Allergies.

## 2020-02-11 NOTE — Procedures (Signed)
Lumbosacral Transforaminal Epidural Steroid Injection - Sub-Pedicular Approach with Fluoroscopic Guidance  Patient: Brenda Gray      Date of Birth: 1961/06/02 MRN: [id number] PCP: Leanna Battles, MD      Visit Date: 02/10/2020   Universal Protocol:    Date/Time: 02/10/2020  Consent Given By: the patient  Position: PRONE  Additional Comments: Vital signs were monitored before and after the procedure. Patient was prepped and draped in the usual sterile fashion. The correct patient, procedure, and site was verified.   Injection Procedure Details:  Procedure Site One Meds Administered:  Meds ordered this encounter  Medications  . methylPREDNISolone acetate (DEPO-MEDROL) injection 80 mg    Laterality: Bilateral  Location/Site:  S1-2  Needle size: 22 G  Needle type: Spinal  Needle Placement: Transforaminal  Findings:    -Comments: Excellent flow of contrast along the nerve and into the epidural space.  Procedure Details: After squaring off the end-plates to get a true AP view, the C-arm was positioned so that an oblique view of the foramen as noted above was visualized. The target area is just inferior to the "nose of the scotty dog" or sub pedicular. The soft tissues overlying this structure were infiltrated with 2-3 ml. of 1% Lidocaine without Epinephrine.  The spinal needle was inserted toward the target using a "trajectory" view along the fluoroscope beam.  Under AP and lateral visualization, the needle was advanced so it did not puncture dura and was located close the 6 O'Clock position of the pedical in AP tracterory. Biplanar projections were used to confirm position. Aspiration was confirmed to be negative for CSF and/or blood. A 1-2 ml. volume of Isovue-250 was injected and flow of contrast was noted at each level. Radiographs were obtained for documentation purposes.   After attaining the desired flow of contrast documented above, a 0.5 to 1.0 ml test dose  of 0.25% Marcaine was injected into each respective transforaminal space.  The patient was observed for 90 seconds post injection.  After no sensory deficits were reported, and normal lower extremity motor function was noted,   the above injectate was administered so that equal amounts of the injectate were placed at each foramen (level) into the transforaminal epidural space.   Additional Comments:  The patient tolerated the procedure well Dressing: 2 x 2 sterile gauze and Band-Aid    Post-procedure details: Patient was observed during the procedure. Post-procedure instructions were reviewed.  Patient left the clinic in stable condition.

## 2020-02-11 NOTE — Progress Notes (Addendum)
Brenda Gray - 59 y.o. female MRN [id number]  Date of birth: [birth year]  Office Visit Note: Visit Date: 02/10/2020 PCP: Leanna Battles, MD Referred by: Leanna Battles, MD  Subjective: Chief Complaint  Patient presents with  . ESI   HPI:  Brenda Gray is a 59 y.o. female who comes in today for planned Bilateral S1-2 lumbar transforaminal epidural steroid injection with fluoroscopic guidance.  The patient has failed conservative care including home exercise, medications, time and activity modification.  This injection will be diagnostic and hopefully therapeutic.  Please see requesting physician notes for further details and justification.  Patient received more than 50% pain relief from prior injection.  Patient does have known arthritis at L4-5 from the facet joints.  She is not really getting mechanical complaints today the last time we saw her was a year ago and she did well with epidural injection so we will repeat that today.  In the future could consider repeat radiofrequency ablation is been a year and a half since we have done that.   ROS Otherwise per HPI.  Assessment & Plan: Visit Diagnoses:  1. Lumbar radiculopathy     Plan: No additional findings.   Meds & Orders:  Meds ordered this encounter  Medications  . methylPREDNISolone acetate (DEPO-MEDROL) injection 80 mg    Orders Placed This Encounter  Procedures  . XR C-ARM NO REPORT  . Epidural Steroid injection    Follow-up: Return if symptoms worsen or fail to improve.   Procedures: No procedures performed  Lumbosacral Transforaminal Epidural Steroid Injection - Sub-Pedicular Approach with Fluoroscopic Guidance  Patient: Brenda Gray      Date of Birth: 04-21-1961 MRN: [id number] PCP: Leanna Battles, MD      Visit Date: 02/10/2020   Universal Protocol:    Date/Time: 02/10/2020  Consent Given By: the patient  Position: PRONE  Additional Comments: Vital signs were monitored  before and after the procedure. Patient was prepped and draped in the usual sterile fashion. The correct patient, procedure, and site was verified.   Injection Procedure Details:  Procedure Site One Meds Administered:  Meds ordered this encounter  Medications  . methylPREDNISolone acetate (DEPO-MEDROL) injection 80 mg    Laterality: Bilateral  Location/Site:  S1-2  Needle size: 22 G  Needle type: Spinal  Needle Placement: Transforaminal  Findings:    -Comments: Excellent flow of contrast along the nerve and into the epidural space.  Procedure Details: After squaring off the end-plates to get a true AP view, the C-arm was positioned so that an oblique view of the foramen as noted above was visualized. The target area is just inferior to the "nose of the scotty dog" or sub pedicular. The soft tissues overlying this structure were infiltrated with 2-3 ml. of 1% Lidocaine without Epinephrine.  The spinal needle was inserted toward the target using a "trajectory" view along the fluoroscope beam.  Under AP and lateral visualization, the needle was advanced so it did not puncture dura and was located close the 6 O'Clock position of the pedical in AP tracterory. Biplanar projections were used to confirm position. Aspiration was confirmed to be negative for CSF and/or blood. A 1-2 ml. volume of Isovue-250 was injected and flow of contrast was noted at each level. Radiographs were obtained for documentation purposes.   After attaining the desired flow of contrast documented above, a 0.5 to 1.0 ml test dose of 0.25% Marcaine was injected into each respective transforaminal space.  The patient was  observed for 90 seconds post injection.  After no sensory deficits were reported, and normal lower extremity motor function was noted,   the above injectate was administered so that equal amounts of the injectate were placed at each foramen (level) into the transforaminal epidural  space.   Additional Comments:  The patient tolerated the procedure well Dressing: 2 x 2 sterile gauze and Band-Aid    Post-procedure details: Patient was observed during the procedure. Post-procedure instructions were reviewed.  Patient left the clinic in stable condition.      Clinical History: Lumbar spine dated 07/04/2012 demonstrates degenerative endplate changes particular at L5-S1 and L4-5 with some facet arthropathy at L4-5.     Objective:  VS:  HT:    WT:   BMI:     BP:115/66  HR:67bpm  TEMP: ( )  RESP:98 % Physical Exam Constitutional:      General: She is not in acute distress.    Appearance: Normal appearance. She is not ill-appearing.  HENT:     Head: Normocephalic and atraumatic.     Right Ear: External ear normal.     Left Ear: External ear normal.  Eyes:     Extraocular Movements: Extraocular movements intact.  Cardiovascular:     Rate and Rhythm: Normal rate.     Pulses: Normal pulses.  Musculoskeletal:     Right lower leg: No edema.     Left lower leg: No edema.     Comments: Patient has good distal strength with no pain over the greater trochanters.  No clonus or focal weakness.  Skin:    Findings: No erythema, lesion or rash.  Neurological:     General: No focal deficit present.     Mental Status: She is alert and oriented to person, place, and time.     Sensory: No sensory deficit.     Motor: No weakness or abnormal muscle tone.     Coordination: Coordination normal.  Psychiatric:        Mood and Affect: Mood normal.        Behavior: Behavior normal.      Imaging: No results found.

## 2020-02-19 DIAGNOSIS — H524 Presbyopia: Secondary | ICD-10-CM | POA: Diagnosis not present

## 2020-02-19 DIAGNOSIS — Z961 Presence of intraocular lens: Secondary | ICD-10-CM | POA: Diagnosis not present

## 2020-02-19 DIAGNOSIS — H26493 Other secondary cataract, bilateral: Secondary | ICD-10-CM | POA: Diagnosis not present

## 2020-03-02 DIAGNOSIS — H26492 Other secondary cataract, left eye: Secondary | ICD-10-CM | POA: Diagnosis not present

## 2020-03-08 ENCOUNTER — Telehealth: Payer: Self-pay | Admitting: *Deleted

## 2020-03-08 NOTE — Telephone Encounter (Signed)
Received a health guide from patient's insurance. Patient is taking Alprazolam, tramadol and Tizanidine.  Reviewed by Hazel Sams, PA-C  Recommendations: Please advise patient to avoid taking alprazolam, tramadol and tizanidine concurrently. She should only take Tizanidine PRN if possible.

## 2020-03-09 DIAGNOSIS — H26491 Other secondary cataract, right eye: Secondary | ICD-10-CM | POA: Diagnosis not present

## 2020-03-16 NOTE — Progress Notes (Signed)
Office Visit Note  Patient: Brenda Gray             Date of Birth: 1960/09/22           MRN: [id number]             PCP: Leanna Battles, MD Referring: Leanna Battles, MD Visit Date: 03/30/2020 Occupation: @GUAROCC @  Subjective:  Left shoulder joint pain   History of Present Illness: Brenda Gray is a 59 y.o. female with history of osteoarthritis, DDD, and osteoporosis.  She has chronic neck and lower back pain and stiffness. She performs neck and lower back stretches on a daily basis. She had an epidural steroid injection performed by Dr. Ernestina Patches on 02/10/20.  She continues to exercise on a daily basis.  She continues to have pain in her left shoulder joint.  She has been using voltaren gel topically which has resolved her nocturnal pain.  She is also having persistent discomfort in the left ankle joint.   Her last DEXA was performed on 09/08/18 and has improved from 08/27/16.    Activities of Daily Living:  Patient reports morning stiffness for  15 minutes.   Patient Reports nocturnal pain.  Difficulty dressing/grooming: Denies Difficulty climbing stairs: Denies Difficulty getting out of chair: Denies Difficulty using hands for taps, buttons, cutlery, and/or writing: Denies  Review of Systems  Constitutional: Negative for fatigue.  HENT: Negative for mouth sores, mouth dryness and nose dryness.   Eyes: Negative for pain, itching, visual disturbance and dryness.  Respiratory: Negative for cough, hemoptysis, shortness of breath and difficulty breathing.   Cardiovascular: Negative for chest pain, palpitations, hypertension and swelling in legs/feet.  Gastrointestinal: Negative for blood in stool, constipation and diarrhea.  Endocrine: Negative for increased urination.  Genitourinary: Negative for difficulty urinating and painful urination.  Musculoskeletal: Positive for arthralgias, joint pain and morning stiffness. Negative for joint swelling, myalgias, muscle  weakness, muscle tenderness and myalgias.  Skin: Negative for color change, pallor, rash, hair loss, nodules/bumps, redness, skin tightness, ulcers and sensitivity to sunlight.  Allergic/Immunologic: Negative for susceptible to infections.  Neurological: Positive for numbness. Negative for dizziness, headaches, memory loss and weakness.  Hematological: Negative for bruising/bleeding tendency and swollen glands.  Psychiatric/Behavioral: Negative for depressed mood, confusion and sleep disturbance. The patient is not nervous/anxious.     PMFS History:  Patient Active Problem List   Diagnosis Date Noted  . Atypical chest pain 06/04/2017  . S/P rotator cuff repair 11/04/2016  . Dyslipidemia 11/04/2016  . History of peptic ulcer disease 11/04/2016  . Interstitial cystitis 11/04/2016  . DJD (degenerative joint disease), cervical 10/29/2016  . Spondylosis of lumbar region without myelopathy or radiculopathy 10/29/2016  . Primary osteoarthritis of both knees 10/29/2016  . 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 Primary insomnia 10/29/2016  . History of recurrent cystitis 10/29/2016  . Primary osteoarthritis of both hands 10/29/2016  . Morton neuroma, right 10/15/2016  . DYSPHAGIA UNSPECIFIED 07/31/2010  . ABDOMINAL PAIN, EPIGASTRIC 07/31/2010    Past Medical History:  Diagnosis Date  . Arthritis   . Atypical chest pain 06/04/2017  . Cataract    bilateral-removed 2 yrs ago 06/25/19  . Chronic headache S/P CERVICAL FUSION'S  . Cystitis, interstitial    frequency/urgency/nocturia  . DDD (degenerative disc disease), cervical   . Dry eyes   . Frequency of urination   . H/O: upper GI bleed   . Hyperlipidemia   . Mild acid reflux OCCASIONAL--  WATCHES DIET  .  Nocturia   . Numbness s/p cerival fusion   right side pain and numbness   . Osteopenia   . Pulmonary nodule noted in 2010   right upper lobe -- stable (monitored by pcp)  . Status post dilation of esophageal  narrowing 2011  . Urge incontinence of urine     Family History  Problem Relation Age of Onset  . Hypertension Mother   . Heart failure Mother   . Colon polyps Mother   . Other Father        had to have esogasus dilated  . Heart disease Brother   . Stroke Maternal Grandmother   . Hypertension Brother   . Heart disease Brother   . Healthy Son   . Esophageal cancer Neg Hx   . Colon cancer Neg Hx   . Rectal cancer Neg Hx   . Stomach cancer Neg Hx    Past Surgical History:  Procedure Laterality Date  . CATARACT EXTRACTION, BILATERAL  07/2017   with lens implants  . CERVICAL DISKECTOMY AND FUSION  05-14-2002   C5 - 7  . COLONOSCOPY    . CYSTO WITH HYDRODISTENSION  07/19/2011   Procedure: CYSTOSCOPY/HYDRODISTENSION;  Surgeon: Malka So;  Location: [facility];  Service: Urology;  Laterality: N/A;  CYSTOSCOPY WITH HYDORDISTENTION OF BLADDER AND INSTILLATION OF MARCAINE AND PYRIDIUM  . cysto/ hod  03-10-2009   [facility]  . CYSTOSCOPY N/A 05/01/2013   Procedure: CYSTOSCOPY;  Surgeon: Reece Packer, MD;  Location[facility];  Service: Urology;  Laterality: N/A;  . CYSTOSCOPY WITH INJECTION  06/17/2012   Procedure: CYSTOSCOPY WITH INJECTION;  Surgeon: Reece Packer, MD;  Location: [place];  Service: Urology;  Laterality: N/A;  BOTOX  . CYSTOSCOPY WITH INJECTION N/A 12/08/2012   Procedure: CYSTOSCOPY WITH BOTOX INJECTION;  Surgeon: Reece Packer, MD;  Location: [place];  Service: Urology;  Laterality: N/A;  . laparoscopy ovarian cystectomy  2000 (APPROX)  . left rotator cuff repair  OCT 2009  . POSTERIOR FUSION CERVICAL SPINE  06-29-2009   C4 - 5  . PUBOVAGINAL SLING N/A 05/01/2013   Procedure:  Lowella Dell;  Surgeon: Reece Packer, MD;  Location: [facility];  Service: Urology;  Laterality: N/A;  . ROTATOR CUFF REPAIR  2001   RIGHT SHOULDER  . TONSILLECTOMY  1981  . VAGINAL  HYSTERECTOMY  1992   Social History   Social History Narrative  . Not on file   Immunization History  Administered Date(s) Administered  . Tdap 02/01/2019     Objective: Vital Signs: BP 130/79 (BP Location: Left Arm, Patient Position: Sitting, Cuff Size: Normal)   Pulse 67   Resp 15   Ht 5' 4.5" (1.638 m)   Wt 140 lb 6.4 oz (63.7 kg)   BMI 23.73 kg/m    Physical Exam Vitals and nursing note reviewed.  Constitutional:      Appearance: She is well-developed.  HENT:     Head: Normocephalic and atraumatic.  Eyes:     Conjunctiva/sclera: Conjunctivae normal.  Pulmonary:     Effort: Pulmonary effort is normal.  Abdominal:     General: Bowel sounds are normal.     Palpations: Abdomen is soft.  Musculoskeletal:     Cervical back: Normal range of motion.  Lymphadenopathy:     Cervical: No cervical adenopathy.  Skin:    General: Skin is warm and dry.     Capillary Refill:  Capillary refill takes less than 2 seconds.  Neurological:     Mental Status: She is alert and oriented to person, place, and time.  Psychiatric:        Behavior: Behavior normal.      Musculoskeletal Exam: C-spine limited ROM.  Painful ROM of the lumbar spine.  Painful ROM of the left shoulder joint.  Right shoulder has good ROM with no discomfort.  Bilateral elbow joint contractures.  Wrist joints, MCPs, PIPs, and DIPs good ROM with no synovitis.  Hip joints, knee joints, ankle joints, MTPs, PIPs, and DIPs good ROM with no synovitis.  No warmth or effusion of knee joints.  No tenderness or swelling of ankle joints.   CDAI Exam: CDAI Score: -- Patient Global: --; Provider Global: -- Swollen: --; Tender: -- Joint Exam 03/30/2020   No joint exam has been documented for this visit   There is currently no information documented on the homunculus. Go to the Rheumatology activity and complete the homunculus joint exam.  Investigation: No additional findings.  Imaging: XR Shoulder Left  Result Date:  03/30/2020 No glenohumeral joint space narrowing was noted.  No acromioclavicular joint space narrowing was noted.  No chondrocalcinosis was noted. Impression: Unremarkable x-ray of the shoulder joint.   Recent Labs: Lab Results  Component Value Date   WBC 5.3 11/12/2017   HGB 13.6 11/12/2017   PLT 250 11/12/2017   NA 142 12/17/2017   K 4.7 12/17/2017   CL 107 12/17/2017   CO2 31 12/17/2017   GLUCOSE 89 12/17/2017   BUN 15 12/17/2017   CREATININE 0.77 12/17/2017   BILITOT 0.4 12/17/2017   ALKPHOS 65 11/05/2016   AST 25 12/17/2017   ALT 17 12/17/2017   PROT 6.5 12/17/2017   ALBUMIN 4.3 11/05/2016   CALCIUM 9.6 12/17/2017   GFRAA 98 12/17/2017    Speciality Comments: Dexa: 09/08/18 Osteopenia T-Score -2.2 BMD 0.800 Hold reclast  Procedures:  Large Joint Inj: L glenohumeral on 03/30/2020 12:46 PM Indications: pain Details: 27 G 1.5 in needle, posterior approach  Arthrogram: No  Medications: 1 mL lidocaine 1 %; 40 mg triamcinolone acetonide 40 MG/ML Aspirate: 0 mL Outcome: tolerated well, no immediate complications Procedure, treatment alternatives, risks and benefits explained, specific risks discussed. Consent was given by the patient. Immediately prior to procedure a time out was called to verify the correct patient, procedure, equipment, support staff and site/side marked as required. Patient was prepped and draped in the usual sterile fashion.     Allergies: Cymbalta [duloxetine hcl] and Lyrica [pregabalin]   Assessment / Plan:     Visit Diagnoses: Age-related osteoporosis without current pathological fracture -  DEXA on 09/08/2018 AP spine BMD 0.800 with T score -2.2.  She had an 11% improvement in her AP spine BMD.  Her last Reclast IV infusion was on April 2019.  She has been holding Reclast since then.  She will be due to update DEXA in December 2021.  She has not had any falls or fractures. She remains active exercising on a daily basis.  She will follow up in 6  months.   Primary osteoarthritis of both hands: She has PIP and DIP thickening consistent with osteoarthritis of both hands.  No tenderness or inflammation.  Joint protection and muscle strengthening were discussed.   Primary osteoarthritis of both knees: She has good range of motion of both knee joints on exam.  No warmth or effusion was noted.  She remains active exercising on a daily basis.  She performs squats for lower extremity muscle strengthening.  She had Visco gel injections performed in November 2019 which provided significant relief.  She does not want to reapply for gel injections at this time.  She was advised to notify us if her knee joint pain returns.   S/P rotator cuff repair: Left-Chronic pain. X-rays updated today.  Subacromial cortisone injection performed today.   Chronic left shoulder pain -She has chronic left shoulder joint pain.  She has good range of motion with discomfort.  X-rays of the left shoulder were obtained today.  She requested a cortisone injection.  Also acromial cortisone injection was performed.  Aftercare was discussed.  She was given a handout of shoulder exercises to perform.  She was advised to notify us if the discomfort persists or worsens.  She was encouraged to continue to use Voltaren gel topically as needed for pain relief.  Plan: XR Shoulder Left  Trochanteric bursitis of both hips: Resolved.  She performs stretching exercises on a regular basis.   DDD (degenerative disc disease), cervical: She has limited range of motion with lateral rotation.  She perform stretching exercises on a daily basis.  She is not experiencing any symptoms of radiculopathy currently.  DDD (degenerative disc disease), thoracic: She is not experiencing any discomfort.  DDD (degenerative disc disease), lumbar: Chronic pain.  She had an epidural steroid injection performed by Dr. Ernestina Patches on 02/10/2020.  She continues to have some discomfort and plans on following up with Dr.  Ernestina Patches for further evaluation and treatment.  Other medical conditions are listed as follows:   Interstitial cystitis  History of peptic ulcer disease  Dyslipidemia  Orders: Orders Placed This Encounter  Procedures  . Large Joint Inj: L glenohumeral  . XR Shoulder Left   No orders of the defined types were placed in this encounter.   Face-to-face time spent with patient was 30 minutes. Greater than 50% of time was spent in counseling and coordination of care.  Follow-Up Instructions: Return in about 6 months (around 09/30/2020) for Osteoporosis, Osteoarthritis, DDD.   Hazel Sams, PA-C  I examined and evaluated the patient with Hazel Sams PA.  Patient had no synovitis on examination.  She has been having discomfort and pain in her left shoulder joint.  After informed consent was obtained and different treatment options were discussed left shoulder joint was injected with cortisone as described above.  She tolerated procedure well.  The plan of care was discussed as noted above.  Bo Merino, MD Note - This record has been created using Editor, commissioning.  Chart creation errors have been sought, but may not always  have been located. Such creation errors do not reflect on  the standard of medical care.

## 2020-03-18 NOTE — Telephone Encounter (Signed)
Patient advised  to avoid taking alprazolam, tramadol and tizanidine concurrently. She should only take Tizanidine PRN if possible.

## 2020-03-30 ENCOUNTER — Ambulatory Visit: Payer: Self-pay

## 2020-03-30 ENCOUNTER — Ambulatory Visit: Payer: Medicare Other | Admitting: Rheumatology

## 2020-03-30 ENCOUNTER — Other Ambulatory Visit: Payer: Self-pay

## 2020-03-30 ENCOUNTER — Encounter: Payer: Self-pay | Admitting: Physician Assistant

## 2020-03-30 VITALS — BP 130/79 | HR 67 | Resp 15 | Ht 64.5 in | Wt 140.4 lb

## 2020-03-30 DIAGNOSIS — M5030 Other cervical disc degeneration, unspecified cervical region: Secondary | ICD-10-CM

## 2020-03-30 DIAGNOSIS — G8929 Other chronic pain: Secondary | ICD-10-CM

## 2020-03-30 DIAGNOSIS — M25512 Pain in left shoulder: Secondary | ICD-10-CM | POA: Diagnosis not present

## 2020-03-30 DIAGNOSIS — M19041 Primary osteoarthritis, right hand: Secondary | ICD-10-CM | POA: Diagnosis not present

## 2020-03-30 DIAGNOSIS — Z9889 Other specified postprocedural states: Secondary | ICD-10-CM

## 2020-03-30 DIAGNOSIS — M810 Age-related osteoporosis without current pathological fracture: Secondary | ICD-10-CM

## 2020-03-30 DIAGNOSIS — M170 Bilateral primary osteoarthritis of knee: Secondary | ICD-10-CM | POA: Diagnosis not present

## 2020-03-30 DIAGNOSIS — M7061 Trochanteric bursitis, right hip: Secondary | ICD-10-CM

## 2020-03-30 DIAGNOSIS — Z8711 Personal history of peptic ulcer disease: Secondary | ICD-10-CM

## 2020-03-30 DIAGNOSIS — E785 Hyperlipidemia, unspecified: Secondary | ICD-10-CM

## 2020-03-30 DIAGNOSIS — M51369 Other intervertebral disc degeneration, lumbar region without mention of lumbar back pain or lower extremity pain: Secondary | ICD-10-CM

## 2020-03-30 DIAGNOSIS — N3010 Interstitial cystitis (chronic) without hematuria: Secondary | ICD-10-CM

## 2020-03-30 DIAGNOSIS — M7062 Trochanteric bursitis, left hip: Secondary | ICD-10-CM

## 2020-03-30 DIAGNOSIS — M19042 Primary osteoarthritis, left hand: Secondary | ICD-10-CM

## 2020-03-30 DIAGNOSIS — M5134 Other intervertebral disc degeneration, thoracic region: Secondary | ICD-10-CM

## 2020-03-30 DIAGNOSIS — M5136 Other intervertebral disc degeneration, lumbar region: Secondary | ICD-10-CM

## 2020-03-30 MED ORDER — TRIAMCINOLONE ACETONIDE 40 MG/ML IJ SUSP
40.0000 mg | INTRAMUSCULAR | Status: AC | PRN
  Administered 2020-03-30: 40 mg via INTRA_ARTICULAR

## 2020-03-30 MED ORDER — LIDOCAINE HCL 1 % IJ SOLN
1.0000 mL | INTRAMUSCULAR | Status: AC | PRN
  Administered 2020-03-30: 1 mL

## 2020-03-30 NOTE — Patient Instructions (Signed)
Shoulder Exercises Ask your health care provider which exercises are safe for you. Do exercises exactly as told by your health care provider and adjust them as directed. It is normal to feel mild stretching, pulling, tightness, or discomfort as you do these exercises. Stop right away if you feel sudden pain or your pain gets worse. Do not begin these exercises until told by your health care provider. Stretching exercises External rotation and abduction This exercise is sometimes called corner stretch. This exercise rotates your arm outward (external rotation) and moves your arm out from your body (abduction). 1. Stand in a doorway with one of your feet slightly in front of the other. This is called a staggered stance. If you cannot reach your forearms to the door frame, stand facing a corner of a room. 2. Choose one of the following positions as told by your health care provider: ? Place your hands and forearms on the door frame above your head. ? Place your hands and forearms on the door frame at the height of your head. ? Place your hands on the door frame at the height of your elbows. 3. Slowly move your weight onto your front foot until you feel a stretch across your chest and in the front of your shoulders. Keep your head and chest upright and keep your abdominal muscles tight. 4. Hold for __________ seconds. 5. To release the stretch, shift your weight to your back foot. Repeat __________ times. Complete this exercise __________ times a day. Extension, standing 1. Stand and hold a broomstick, a cane, or a similar object behind your back. ? Your hands should be a little wider than shoulder width apart. ? Your palms should face away from your back. 2. Keeping your elbows straight and your shoulder muscles relaxed, move the stick away from your body until you feel a stretch in your shoulders (extension). ? Avoid shrugging your shoulders while you move the stick. Keep your shoulder blades tucked  down toward the middle of your back. 3. Hold for __________ seconds. 4. Slowly return to the starting position. Repeat __________ times. Complete this exercise __________ times a day. Range-of-motion exercises Pendulum  1. Stand near a wall or a surface that you can hold onto for balance. 2. Bend at the waist and let your left / right arm hang straight down. Use your other arm to support you. Keep your back straight and do not lock your knees. 3. Relax your left / right arm and shoulder muscles, and move your hips and your trunk so your left / right arm swings freely. Your arm should swing because of the motion of your body, not because you are using your arm or shoulder muscles. 4. Keep moving your hips and trunk so your arm swings in the following directions, as told by your health care provider: ? Side to side. ? Forward and backward. ? In clockwise and counterclockwise circles. 5. Continue each motion for __________ seconds, or for as long as told by your health care provider. 6. Slowly return to the starting position. Repeat __________ times. Complete this exercise __________ times a day. Shoulder flexion, standing  1. Stand and hold a broomstick, a cane, or a similar object. Place your hands a little more than shoulder width apart on the object. Your left / right hand should be palm up, and your other hand should be palm down. 2. Keep your elbow straight and your shoulder muscles relaxed. Push the stick up with your healthy arm to   raise your left / right arm in front of your body, and then over your head until you feel a stretch in your shoulder (flexion). ? Avoid shrugging your shoulder while you raise your arm. Keep your shoulder blade tucked down toward the middle of your back. 3. Hold for __________ seconds. 4. Slowly return to the starting position. Repeat __________ times. Complete this exercise __________ times a day. Shoulder abduction, standing 1. Stand and hold a broomstick,  a cane, or a similar object. Place your hands a little more than shoulder width apart on the object. Your left / right hand should be palm up, and your other hand should be palm down. 2. Keep your elbow straight and your shoulder muscles relaxed. Push the object across your body toward your left / right side. Raise your left / right arm to the side of your body (abduction) until you feel a stretch in your shoulder. ? Do not raise your arm above shoulder height unless your health care provider tells you to do that. ? If directed, raise your arm over your head. ? Avoid shrugging your shoulder while you raise your arm. Keep your shoulder blade tucked down toward the middle of your back. 3. Hold for __________ seconds. 4. Slowly return to the starting position. Repeat __________ times. Complete this exercise __________ times a day. Internal rotation  1. Place your left / right hand behind your back, palm up. 2. Use your other hand to dangle an exercise band, a towel, or a similar object over your shoulder. Grasp the band with your left / right hand so you are holding on to both ends. 3. Gently pull up on the band until you feel a stretch in the front of your left / right shoulder. The movement of your arm toward the center of your body is called internal rotation. ? Avoid shrugging your shoulder while you raise your arm. Keep your shoulder blade tucked down toward the middle of your back. 4. Hold for __________ seconds. 5. Release the stretch by letting go of the band and lowering your hands. Repeat __________ times. Complete this exercise __________ times a day. Strengthening exercises External rotation  1. Sit in a stable chair without armrests. 2. Secure an exercise band to a stable object at elbow height on your left / right side. 3. Place a soft object, such as a folded towel or a small pillow, between your left / right upper arm and your body to move your elbow about 4 inches (10 cm) away  from your side. 4. Hold the end of the exercise band so it is tight and there is no slack. 5. Keeping your elbow pressed against the soft object, slowly move your forearm out, away from your abdomen (external rotation). Keep your body steady so only your forearm moves. 6. Hold for __________ seconds. 7. Slowly return to the starting position. Repeat __________ times. Complete this exercise __________ times a day. Shoulder abduction  1. Sit in a stable chair without armrests, or stand up. 2. Hold a __________ weight in your left / right hand, or hold an exercise band with both hands. 3. Start with your arms straight down and your left / right palm facing in, toward your body. 4. Slowly lift your left / right hand out to your side (abduction). Do not lift your hand above shoulder height unless your health care provider tells you that this is safe. ? Keep your arms straight. ? Avoid shrugging your shoulder while you   do this movement. Keep your shoulder blade tucked down toward the middle of your back. 5. Hold for __________ seconds. 6. Slowly lower your arm, and return to the starting position. Repeat __________ times. Complete this exercise __________ times a day. Shoulder extension 1. Sit in a stable chair without armrests, or stand up. 2. Secure an exercise band to a stable object in front of you so it is at shoulder height. 3. Hold one end of the exercise band in each hand. Your palms should face each other. 4. Straighten your elbows and lift your hands up to shoulder height. 5. Step back, away from the secured end of the exercise band, until the band is tight and there is no slack. 6. Squeeze your shoulder blades together as you pull your hands down to the sides of your thighs (extension). Stop when your hands are straight down by your sides. Do not let your hands go behind your body. 7. Hold for __________ seconds. 8. Slowly return to the starting position. Repeat __________ times.  Complete this exercise __________ times a day. Shoulder row 1. Sit in a stable chair without armrests, or stand up. 2. Secure an exercise band to a stable object in front of you so it is at waist height. 3. Hold one end of the exercise band in each hand. Position your palms so that your thumbs are facing the ceiling (neutral position). 4. Bend each of your elbows to a 90-degree angle (right angle) and keep your upper arms at your sides. 5. Step back until the band is tight and there is no slack. 6. Slowly pull your elbows back behind you. 7. Hold for __________ seconds. 8. Slowly return to the starting position. Repeat __________ times. Complete this exercise __________ times a day. Shoulder press-ups  1. Sit in a stable chair that has armrests. Sit upright, with your feet flat on the floor. 2. Put your hands on the armrests so your elbows are bent and your fingers are pointing forward. Your hands should be about even with the sides of your body. 3. Push down on the armrests and use your arms to lift yourself off the chair. Straighten your elbows and lift yourself up as much as you comfortably can. ? Move your shoulder blades down, and avoid letting your shoulders move up toward your ears. ? Keep your feet on the ground. As you get stronger, your feet should support less of your body weight as you lift yourself up. 4. Hold for __________ seconds. 5. Slowly lower yourself back into the chair. Repeat __________ times. Complete this exercise __________ times a day. Wall push-ups  1. Stand so you are facing a stable wall. Your feet should be about one arm-length away from the wall. 2. Lean forward and place your palms on the wall at shoulder height. 3. Keep your feet flat on the floor as you bend your elbows and lean forward toward the wall. 4. Hold for __________ seconds. 5. Straighten your elbows to push yourself back to the starting position. Repeat __________ times. Complete this exercise  __________ times a day. This information is not intended to replace advice given to you by your health care provider. Make sure you discuss any questions you have with your health care provider. Document Revised: 12/19/2018 Document Reviewed: 09/26/2018 [employer] Patient Education  2020 [employer]  

## 2020-04-12 DIAGNOSIS — M810 Age-related osteoporosis without current pathological fracture: Secondary | ICD-10-CM | POA: Diagnosis not present

## 2020-04-12 DIAGNOSIS — Z0000 Encounter for general adult medical examination without abnormal findings: Secondary | ICD-10-CM | POA: Diagnosis not present

## 2020-04-12 DIAGNOSIS — E7849 Other hyperlipidemia: Secondary | ICD-10-CM | POA: Diagnosis not present

## 2020-04-12 DIAGNOSIS — Z01419 Encounter for gynecological examination (general) (routine) without abnormal findings: Secondary | ICD-10-CM | POA: Diagnosis not present

## 2020-04-19 DIAGNOSIS — E785 Hyperlipidemia, unspecified: Secondary | ICD-10-CM | POA: Diagnosis not present

## 2020-04-19 DIAGNOSIS — R82998 Other abnormal findings in urine: Secondary | ICD-10-CM | POA: Diagnosis not present

## 2020-04-19 DIAGNOSIS — Z1212 Encounter for screening for malignant neoplasm of rectum: Secondary | ICD-10-CM | POA: Diagnosis not present

## 2020-04-19 DIAGNOSIS — Z0000 Encounter for general adult medical examination without abnormal findings: Secondary | ICD-10-CM | POA: Diagnosis not present

## 2020-04-19 DIAGNOSIS — F329 Major depressive disorder, single episode, unspecified: Secondary | ICD-10-CM | POA: Diagnosis not present

## 2020-04-19 DIAGNOSIS — G4700 Insomnia, unspecified: Secondary | ICD-10-CM | POA: Diagnosis not present

## 2020-04-22 ENCOUNTER — Other Ambulatory Visit: Payer: Self-pay | Admitting: Internal Medicine

## 2020-04-22 DIAGNOSIS — E049 Nontoxic goiter, unspecified: Secondary | ICD-10-CM

## 2020-05-03 DIAGNOSIS — Z0120 Encounter for dental examination and cleaning without abnormal findings: Secondary | ICD-10-CM | POA: Diagnosis not present

## 2020-05-05 ENCOUNTER — Ambulatory Visit
Admission: RE | Admit: 2020-05-05 | Discharge: 2020-05-05 | Disposition: A | Discharge status: Home / Self Care | Payer: Medicare Other | Source: Ambulatory Visit | Attending: Internal Medicine | Admitting: Internal Medicine

## 2020-05-05 DIAGNOSIS — E041 Nontoxic single thyroid nodule: Secondary | ICD-10-CM | POA: Diagnosis not present

## 2020-05-05 DIAGNOSIS — E049 Nontoxic goiter, unspecified: Secondary | ICD-10-CM

## 2020-05-20 ENCOUNTER — Telehealth: Payer: Self-pay | Admitting: Physical Medicine and Rehabilitation

## 2020-05-20 NOTE — Telephone Encounter (Signed)
Patient called.   She said she would like to proceed with having an RF procedure done. Wants to know if she needs another appointment first though.   Call back: [phone]

## 2020-05-20 NOTE — Telephone Encounter (Signed)
Ok to repeat L5-S1 RFA?

## 2020-05-20 NOTE — Telephone Encounter (Signed)
Ok if able to do repeat from 2015. RFA of L4-5 facet. On SRS. If trouble with Pre-Auth really need MRI and OV.

## 2020-05-20 NOTE — Telephone Encounter (Signed)
Called pt and sch 06/22/20

## 2020-06-01 ENCOUNTER — Other Ambulatory Visit: Payer: Self-pay | Admitting: Rheumatology

## 2020-06-22 ENCOUNTER — Ambulatory Visit: Payer: Self-pay

## 2020-06-22 ENCOUNTER — Ambulatory Visit: Payer: Medicare Other | Admitting: Physical Medicine and Rehabilitation

## 2020-06-22 ENCOUNTER — Encounter: Payer: Self-pay | Admitting: Physical Medicine and Rehabilitation

## 2020-06-22 ENCOUNTER — Other Ambulatory Visit: Payer: Self-pay

## 2020-06-22 VITALS — BP 123/76 | HR 63

## 2020-06-22 DIAGNOSIS — M47816 Spondylosis without myelopathy or radiculopathy, lumbar region: Secondary | ICD-10-CM

## 2020-06-22 MED ORDER — METHYLPREDNISOLONE ACETATE 80 MG/ML IJ SUSP
80.0000 mg | Freq: Once | INTRAMUSCULAR | Status: AC
  Administered 2020-06-22: 80 mg

## 2020-06-22 NOTE — Progress Notes (Signed)
Pt state lower back pain. Pt state when walking and she lifts that right leg she feels a sharp pain and the left leg feels numb. Pt state she use heating pad and excise to ease the pain. Pt state she use pain meds sometimes. Pt has hx of inj on 02/10/20 pt state it has been good.  Numeric Pain Rating Scale and Functional Assessment Average Pain 6   In the last MONTH (on 0-10 scale) has pain interfered with the following?  1. General activity like being  able to carry out your everyday physical activities such as walking, climbing stairs, carrying groceries, or moving a chair?  Rating(6)   +Driver, -BT, -Dye Allergies.

## 2020-07-04 NOTE — Progress Notes (Signed)
Brenda Gray - 59 y.o. female MRN [id number]  Date of birth: 08-Jun-1961  Office Visit Note: Visit Date: 06/22/2020 PCP: Leanna Battles, MD Referred by: Leanna Battles, MD  Subjective: Chief Complaint  Patient presents with  . Lower Back - Pain   HPI:  Brenda Gray is a 59 y.o. female who comes in today for planned repeat radiofrequency ablation of the Bilateral L5-S1 Lumbar facet joints. This would be ablation of the corresponding medial branches and/or dorsal rami.  Patient has had double diagnostic blocks with more than 70% relief.  Subsequent ablation gave them more than 6 months of over 60% relief.  They have had chronic back pain for quite some time, more than 3 months, which has been an ongoing situation with recalcitrant axial back pain.  They have no radicular pain.  Their axial pain is worse with standing and ambulating and on exam today with facet loading.  They have had physical therapy as well as home exercise program.  The imaging noted in the chart below indicated facet pathology. Accordingly they meet all the criteria and qualification for for radiofrequency ablation and we are going to complete this today hopefully for more longer term relief as part of comprehensive management program.   ROS Otherwise per HPI.  Assessment & Plan: Visit Diagnoses:  1. Spondylosis without myelopathy or radiculopathy, lumbar region     Plan: No additional findings.   Meds & Orders:  Meds ordered this encounter  Medications  . methylPREDNISolone acetate (DEPO-MEDROL) injection 80 mg    Orders Placed This Encounter  Procedures  . Radiofrequency,Lumbar  . XR C-ARM NO REPORT    Follow-up: Return if symptoms worsen or fail to improve.   Procedures: No procedures performed  Lumbar Facet Joint Nerve Denervation  Patient: Brenda Gray      Date of Birth: 12/05/1960 MRN: [id number] PCP: Leanna Battles, MD      Visit Date: 06/22/2020   Universal Protocol:     Date/Time: 10/25/216:01 AM  Consent Given By: the patient  Position: PRONE  Additional Comments: Vital signs were monitored before and after the procedure. Patient was prepped and draped in the usual sterile fashion. The correct patient, procedure, and site was verified.   Injection Procedure Details:   Procedure diagnoses:  1. Spondylosis without myelopathy or radiculopathy, lumbar region      Meds Administered:  Meds ordered this encounter  Medications  . methylPREDNISolone acetate (DEPO-MEDROL) injection 80 mg     Laterality: Bilateral  Location/Site:  L5-S1  Needle size: 18 G  Needle type: Radiofrequency cannula  Needle Placement: Along juncture of superior articular process and transverse pocess  Findings:  -Comments:  Procedure Details: For each desired target nerve, the corresponding transverse process (sacral ala for the L5 dorsal rami) was identified and the fluoroscope was positioned to square off the endplates of the corresponding vertebral body to achieve a true AP midline view.  The beam was then obliqued 15 to 20 degrees and caudally tilted 15 to 20 degrees to line up a trajectory along the target nerves. The skin over the target of the junction of superior articulating process and transverse process (sacral ala for the L5 dorsal rami) was infiltrated with 11ml of 1% Lidocaine without Epinephrine.  The 18 gauge 29mm active tip outer cannula was advanced in trajectory view to the target.  This procedure was repeated for each target nerve.  Then, for all levels, the outer cannula placement was fine-tuned and the position was  then confirmed with bi-planar imaging.    Test stimulation was done both at sensory and motor levels to ensure there was no radicular stimulation. The target tissues were then infiltrated with 1 ml of 1% Lidocaine without Epinephrine. Subsequently, a percutaneous neurotomy was carried out for 90 seconds at 80 degrees Celsius.  After the  completion of the lesion, 1 ml of injectate was delivered. It was then repeated for each facet joint nerve mentioned above. Appropriate radiographs were obtained to verify the probe placement during the neurotomy.   Additional Comments:  The patient tolerated the procedure well Dressing: 2 x 2 sterile gauze and Band-Aid    Post-procedure details: Patient was observed during the procedure. Post-procedure instructions were reviewed.  Patient left the clinic in stable condition.       Clinical History: Lumbar spine dated 07/04/2012 demonstrates degenerative endplate changes particular at L5-S1 and L4-5 with some facet arthropathy at L4-5.     Objective:  VS:  HT:    WT:   BMI:     BP:123/76  HR:63bpm  TEMP: ( )  RESP:  Physical Exam Constitutional:      General: She is not in acute distress.    Appearance: Normal appearance. She is not ill-appearing.  HENT:     Head: Normocephalic and atraumatic.     Right Ear: External ear normal.     Left Ear: External ear normal.  Eyes:     Extraocular Movements: Extraocular movements intact.  Cardiovascular:     Rate and Rhythm: Normal rate.     Pulses: Normal pulses.  Musculoskeletal:     Right lower leg: No edema.     Left lower leg: No edema.     Comments: Patient has good distal strength with no pain over the greater trochanters.  No clonus or focal weakness.Patient somewhat slow to rise from a seated position to full extension.  There is concordant low back pain with facet loading and lumbar spine extension rotation.  There are no definitive trigger points but the patient is somewhat tender across the lower back and PSIS.  There is no pain with hip rotation.   Skin:    Findings: No erythema, lesion or rash.  Neurological:     General: No focal deficit present.     Mental Status: She is alert and oriented to person, place, and time.     Sensory: No sensory deficit.     Motor: No weakness or abnormal muscle tone.      Coordination: Coordination normal.  Psychiatric:        Mood and Affect: Mood normal.        Behavior: Behavior normal.      Imaging: No results found.

## 2020-07-04 NOTE — Procedures (Signed)
Lumbar Facet Joint Nerve Denervation  Patient: Brenda Gray      Date of Birth: 1960/12/31 MRN: [id number] PCP: Leanna Battles, MD      Visit Date: 06/22/2020   Universal Protocol:    Date/Time: 10/25/216:01 AM  Consent Given By: the patient  Position: PRONE  Additional Comments: Vital signs were monitored before and after the procedure. Patient was prepped and draped in the usual sterile fashion. The correct patient, procedure, and site was verified.   Injection Procedure Details:   Procedure diagnoses:  1. Spondylosis without myelopathy or radiculopathy, lumbar region      Meds Administered:  Meds ordered this encounter  Medications  . methylPREDNISolone acetate (DEPO-MEDROL) injection 80 mg     Laterality: Bilateral  Location/Site:  L5-S1  Needle size: 18 G  Needle type: Radiofrequency cannula  Needle Placement: Along juncture of superior articular process and transverse pocess  Findings:  -Comments:  Procedure Details: For each desired target nerve, the corresponding transverse process (sacral ala for the L5 dorsal rami) was identified and the fluoroscope was positioned to square off the endplates of the corresponding vertebral body to achieve a true AP midline view.  The beam was then obliqued 15 to 20 degrees and caudally tilted 15 to 20 degrees to line up a trajectory along the target nerves. The skin over the target of the junction of superior articulating process and transverse process (sacral ala for the L5 dorsal rami) was infiltrated with 34ml of 1% Lidocaine without Epinephrine.  The 18 gauge 25mm active tip outer cannula was advanced in trajectory view to the target.  This procedure was repeated for each target nerve.  Then, for all levels, the outer cannula placement was fine-tuned and the position was then confirmed with bi-planar imaging.    Test stimulation was done both at sensory and motor levels to ensure there was no radicular  stimulation. The target tissues were then infiltrated with 1 ml of 1% Lidocaine without Epinephrine. Subsequently, a percutaneous neurotomy was carried out for 90 seconds at 80 degrees Celsius.  After the completion of the lesion, 1 ml of injectate was delivered. It was then repeated for each facet joint nerve mentioned above. Appropriate radiographs were obtained to verify the probe placement during the neurotomy.   Additional Comments:  The patient tolerated the procedure well Dressing: 2 x 2 sterile gauze and Band-Aid    Post-procedure details: Patient was observed during the procedure. Post-procedure instructions were reviewed.  Patient left the clinic in stable condition.

## 2020-07-22 NOTE — Progress Notes (Signed)
Office Visit Note  Patient: Brenda Gray             Date of Birth: 12/19/1960           MRN: [id number]             PCP: Leanna Battles, MD Referring: Leanna Battles, MD Visit Date: 08/01/2020 Occupation: @GUAROCC @  Subjective:  Left shoulder and left knee joint pain  History of Present Illness: Brenda Gray is a 59 y.o. female with history of osteoarthritis, DDD, and osteoporosis.  She presents today with discomfort in the left shoulder joint and left knee joint.  She states she has been experiencing nocturnal pain in the left shoulder when laying on her side.  She has noticed increased stiffness and discomfort with ROM.  She states her pain was also exacerbated by shoveling and staining her deck several weeks ago. She had a radiofrequency ablation performed by Dr. Ernestina Patches on 06/22/20 which provided significant relief.  Her lower back nocturnal pain has resolved.  She denies any recent falls or fractures. She continues to take calcium and vitamin D as recommended.   Activities of Daily Living:  Patient reports morning stiffness for  10-15 minutes.   Patient Reports nocturnal pain.  Difficulty dressing/grooming: Denies Difficulty climbing stairs: Denies Difficulty getting out of chair: Denies Difficulty using hands for taps, buttons, cutlery, and/or writing: Reports  Review of Systems  Constitutional: Negative for fatigue.  HENT: Negative for mouth sores, mouth dryness and nose dryness.   Eyes: Negative for pain, itching and dryness.  Respiratory: Negative for shortness of breath and difficulty breathing.   Cardiovascular: Negative for chest pain and palpitations.  Gastrointestinal: Negative for blood in stool, constipation and diarrhea.  Endocrine: Negative for increased urination.  Genitourinary: Negative for difficulty urinating.  Musculoskeletal: Positive for arthralgias, joint pain and morning stiffness. Negative for joint swelling, myalgias, muscle  tenderness and myalgias.  Skin: Negative for color change, rash and redness.  Allergic/Immunologic: Negative for susceptible to infections.  Neurological: Positive for numbness. Negative for dizziness, headaches, memory loss and weakness.  Hematological: Negative for bruising/bleeding tendency.  Psychiatric/Behavioral: Negative for confusion.    PMFS History:  Patient Active Problem List   Diagnosis Date Noted  . Atypical chest pain 06/04/2017  . S/P rotator cuff repair 11/04/2016  . Dyslipidemia 11/04/2016  . History of peptic ulcer disease 11/04/2016  . Interstitial cystitis 11/04/2016  . DJD (degenerative joint disease), cervical 10/29/2016  . Spondylosis of lumbar region without myelopathy or radiculopathy 10/29/2016  . Primary osteoarthritis of both knees 10/29/2016  . 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 Primary insomnia 10/29/2016  . History of recurrent cystitis 10/29/2016  . Primary osteoarthritis of both hands 10/29/2016  . Morton neuroma, right 10/15/2016  . DYSPHAGIA UNSPECIFIED 07/31/2010  . ABDOMINAL PAIN, EPIGASTRIC 07/31/2010    Past Medical History:  Diagnosis Date  . Arthritis   . Atypical chest pain 06/04/2017  . Cataract    bilateral-removed 2 yrs ago 06/25/19  . Chronic headache S/P CERVICAL FUSION'S  . Cystitis, interstitial    frequency/urgency/nocturia  . DDD (degenerative disc disease), cervical   . Dry eyes   . Frequency of urination   . H/O: upper GI bleed   . Hyperlipidemia   . Mild acid reflux OCCASIONAL--  WATCHES DIET  . Nocturia   . Numbness s/p cerival fusion   right side pain and numbness   . Osteopenia   . Pulmonary nodule noted in 2010  right upper lobe -- stable (monitored by pcp)  . Status post dilation of esophageal narrowing 2011  . Urge incontinence of urine     Family History  Problem Relation Age of Onset  . Hypertension Mother   . Heart failure Mother   . Colon polyps Mother   . Other Father         had to have esogasus dilated  . Heart disease Brother   . Stroke Maternal Grandmother   . Hypertension Brother   . Heart disease Brother   . Healthy Son   . Esophageal cancer Neg Hx   . Colon cancer Neg Hx   . Rectal cancer Neg Hx   . Stomach cancer Neg Hx    Past Surgical History:  Procedure Laterality Date  . CATARACT EXTRACTION, BILATERAL  07/2017   with lens implants  . CERVICAL DISKECTOMY AND FUSION  05-14-2002   C5 - 7  . COLONOSCOPY    . CYSTO WITH HYDRODISTENSION  07/19/2011   Procedure: CYSTOSCOPY/HYDRODISTENSION;  Surgeon: Malka So;  Location: [facility];  Service: Urology;  Laterality: N/A;  CYSTOSCOPY WITH HYDORDISTENTION OF BLADDER AND INSTILLATION OF MARCAINE AND PYRIDIUM  . cysto/ hod  03-10-2009   [facility]  . CYSTOSCOPY N/A 05/01/2013   Procedure: CYSTOSCOPY;  Surgeon: Reece Packer, MD;  Location: [facility];  Service: Urology;  Laterality: N/A;  . CYSTOSCOPY WITH INJECTION  06/17/2012   Procedure: CYSTOSCOPY WITH INJECTION;  Surgeon: Reece Packer, MD;  Location: [facility];  Service: Urology;  Laterality: N/A;  BOTOX  . CYSTOSCOPY WITH INJECTION N/A 12/08/2012   Procedure: CYSTOSCOPY WITH BOTOX INJECTION;  Surgeon: Reece Packer, MD;  Location: [facility];  Service: Urology;  Laterality: N/A;  . laparoscopy ovarian cystectomy  2000 (APPROX)  . left rotator cuff repair  OCT 2009  . POSTERIOR FUSION CERVICAL SPINE  06-29-2009   C4 - 5  . PUBOVAGINAL SLING N/A 05/01/2013   Procedure:  Lowella Dell;  Surgeon: Reece Packer, MD;  Location: [facility];  Service: Urology;  Laterality: N/A;  . ROTATOR CUFF REPAIR  2001   RIGHT SHOULDER  . TONSILLECTOMY  1981  . VAGINAL HYSTERECTOMY  1992   Social History   Social History Narrative  . Not on file   Immunization History  Administered Date(s) Administered  . PFIZER SARS-COV-2 Vaccination 11/27/2019, 12/18/2019,  06/24/2020  . Tdap 02/01/2019     Objective: Vital Signs: BP 134/72 (BP Location: Left Arm, Patient Position: Sitting, Cuff Size: Normal)   Pulse 71   Resp 14   Ht 5' 4.5" (1.638 m)   Wt 141 lb 9.6 oz (64.2 kg)   BMI 23.93 kg/m    Physical Exam Vitals and nursing note reviewed.  Constitutional:      Appearance: She is well-developed.  HENT:     Head: Normocephalic and atraumatic.  Eyes:     Conjunctiva/sclera: Conjunctivae normal.  Pulmonary:     Effort: Pulmonary effort is normal.  Abdominal:     Palpations: Abdomen is soft.  Musculoskeletal:     Cervical back: Normal range of motion.  Skin:    General: Skin is warm and dry.     Capillary Refill: Capillary refill takes less than 2 seconds.  Neurological:     Mental Status: She is alert and oriented to person, place, and time.  Psychiatric:        Behavior: Behavior normal.  Musculoskeletal Exam: C-spine limited ROM.  Thoracic and lumbar spine good ROM.  No midline spinal tenderness. No SI joint tenderness.  Right shoulder has full ROM.  Left shoulder has painful and limited internal rotation.  Tenderness over the subacromial bursa.  Elbow joints, wrist joints, MCPs, PIPs, and DIPs good ROM with no synovitis.  PIP and DIP thickening consistent with osteoarthritis of both hands.  Hip joints good ROM.  Left knee has painful extension.  Right knee has good ROM with no discomfort. No warmth or effusion of knee joints noted.  Ankle joints good ROM with no tenderness or inflammation.   CDAI Exam: CDAI Score: -- Patient Global: --; Provider Global: -- Swollen: --; Tender: -- Joint Exam 08/01/2020   No joint exam has been documented for this visit   There is currently no information documented on the homunculus. Go to the Rheumatology activity and complete the homunculus joint exam.  Investigation: No additional findings.  Imaging: XR KNEE 3 VIEW LEFT  Result Date: 08/01/2020 Mild to moderate medial compartment  narrowing with the intercondylar and medial osteophytes were noted.  Mild patellofemoral narrowing was noted.  No chondrocalcinosis was noted. Impression: These findings are consistent mild to moderate osteoarthritis and mild chondromalacia patella.  XR KNEE 3 VIEW RIGHT  Result Date: 08/01/2020 Mild medial compartment narrowing and mild patellofemoral narrowing was noted.  No chondrocalcinosis was noted. Impression: These findings are consistent mild osteoarthritis and mild chondromalacia patella.   Recent Labs: Lab Results  Component Value Date   WBC 5.3 11/12/2017   HGB 13.6 11/12/2017   PLT 250 11/12/2017   NA 142 12/17/2017   K 4.7 12/17/2017   CL 107 12/17/2017   CO2 31 12/17/2017   GLUCOSE 89 12/17/2017   BUN 15 12/17/2017   CREATININE 0.77 12/17/2017   BILITOT 0.4 12/17/2017   ALKPHOS 65 11/05/2016   AST 25 12/17/2017   ALT 17 12/17/2017   PROT 6.5 12/17/2017   ALBUMIN 4.3 11/05/2016   CALCIUM 9.6 12/17/2017   GFRAA 98 12/17/2017    Speciality Comments: Dexa: 09/08/18 Osteopenia T-Score -2.2 BMD 0.800 Hold reclast  Procedures:  Large Joint Inj: L subacromial bursa on 08/01/2020 8:29 AM Indications: pain Details: 27 G 1.5 in needle, posterior approach  Arthrogram: No  Medications: 40 mg triamcinolone acetonide 40 MG/ML; 1 mL lidocaine 1 % Aspirate: 0 mL Outcome: tolerated well, no immediate complications Procedure, treatment alternatives, risks and benefits explained, specific risks discussed. Consent was given by the patient. Immediately prior to procedure a time out was called to verify the correct patient, procedure, equipment, support staff and site/side marked as required. Patient was prepped and draped in the usual sterile fashion.   Large Joint Inj: L knee on 08/01/2020 8:29 AM Indications: pain Details: 27 G 1.5 in needle, medial approach  Arthrogram: No  Medications: 1.5 mL lidocaine 1 %; 40 mg triamcinolone acetonide 40 MG/ML Aspirate: 0  mL Outcome: tolerated well, no immediate complications Procedure, treatment alternatives, risks and benefits explained, specific risks discussed. Consent was given by the patient. Immediately prior to procedure a time out was called to verify the correct patient, procedure, equipment, support staff and site/side marked as required. Patient was prepped and draped in the usual sterile fashion.     Allergies: Cymbalta [duloxetine hcl] and Lyrica [pregabalin]   Assessment / Plan:     Visit Diagnoses: Age-related osteoporosis without current pathological fracture - DEXA on 09/08/2018 AP spine BMD 0.800 with T score -2.2.  She had an  11% improvement in her AP spine BMD.  Her last Reclast IV infusion was on April 2019.  She has not had any recent falls or fractures.  She has no midline spinal tenderness concerning for a vertebral fracture.  She will be due to update DEXA in December 2021.  She was advised to continue taking calcium and vitamin D as recommended.  She performs resistive exercises and exercises on a daily basis.  We will schedule an appointment to discuss DEXA results once available.   - Plan: DG BONE DENSITY (DXA)  Primary osteoarthritis of both hands: She has PIP and DIP thickening consistent with osteoarthritis of both hands.  No tenderness or inflammation was noted.  She has complete fist formation bilaterally.  Primary osteoarthritis of both knees -she presents today with increased discomfort in the left knee joint.  She has painful range of motion with some stiffness especially with extension of the left knee.  No warmth or effusion of the knee joints were noted.  Right knee has good range of motion with no discomfort.  X-rays of both knees were updated today.  She requested a left knee joint cortisone injection.  She tolerated procedure well.  Aftercare was discussed.  She was advised to notify us when she would like to reapply for Visco gel injections for both knee joints.  Her most  recent Visco gel injections were performed in October//November 2019.  Plan: XR KNEE 3 VIEW RIGHT, XR KNEE 3 VIEW LEFT  S/P rotator cuff repair-left: She presents today with increased discomfort in the left shoulder.  Painful and limited internal rotation noted.  Tenderness over the subacromial bursa.  Left subacromial space injected with cortisone today.   Chronic left shoulder pain - She presents today with acute on chronic pain in the left shoulder joint.  She has not had any recent injuries but her discomfort seems to have been exacerbated by recently shoveling and staining her deck.  She has been experiencing nocturnal pain over the past several weeks.  She has limited internal rotation with discomfort.  Her last cortisone injection was on 03/30/2020.  She requested an injection today-subacromial approach.  She tolerated the procedure well.  Aftercare was discussed.  Procedure note completed above.   Discussed the importance of stretching and strengthening exercises.   Chronic pain of both knees - She presents today with increased discomfort in the left knee joint over the past couple of months.  She has not had any recent injuries or falls.  X-rays of both knees were updated today which were consistent with mild to moderate osteoarthritis.  She requested a left knee joint cortisone injection today.  She tolerated the procedure well.  Aftercare was discussed. She will notify us when she would like to reapply for visco gel injections. She will continue to use voltaren gel topically as needed for pain relief.  Plan: XR KNEE 3 VIEW RIGHT, XR KNEE 3 VIEW LEFT  Trochanteric bursitis of both hips - Resolved.  She has no tenderness palpation on exam.  DDD (degenerative disc disease), cervical: She has limited range of motion with lateral rotation.  She has chronic neck stiffness but is not having any discomfort or symptoms of radiculopathy at this time.  She was encouraged to continue to perform neck  stretching exercises on a daily basis.  DDD (degenerative disc disease), thoracic: No midline spinal tenderness.  DDD (degenerative disc disease), lumbar - She had an epidural steroid injection performed by Dr. Ernestina Patches on 02/10/2020 and a  radiofrequency ablation performed on 06/22/20.  Her nocturnal pain has resolved.  She is not having any symptoms of radiculopathy at this time.  She has no midline spinal tenderness.  Other medical conditions are listed as follows:  History of peptic ulcer disease  Interstitial cystitis  Dyslipidemia   Orders: Orders Placed This Encounter  Procedures  . Large Joint Inj: L subacromial bursa  . Large Joint Inj: L knee  . XR KNEE 3 VIEW RIGHT  . XR KNEE 3 VIEW LEFT  . DG BONE DENSITY (DXA)   No orders of the defined types were placed in this encounter.     Follow-Up Instructions: Return in about 6 months (around 01/29/2021) for Osteoarthritis, Osteoporosis.   Ofilia Neas, PA-C  Note - This record has been created using Dragon software.  Chart creation errors have been sought, but may not always  have been located. Such creation errors do not reflect on  the standard of medical care.

## 2020-07-29 DIAGNOSIS — L570 Actinic keratosis: Secondary | ICD-10-CM | POA: Diagnosis not present

## 2020-07-29 DIAGNOSIS — L720 Epidermal cyst: Secondary | ICD-10-CM | POA: Diagnosis not present

## 2020-07-29 DIAGNOSIS — M795 Residual foreign body in soft tissue: Secondary | ICD-10-CM | POA: Diagnosis not present

## 2020-08-01 ENCOUNTER — Encounter: Payer: Self-pay | Admitting: Physician Assistant

## 2020-08-01 ENCOUNTER — Ambulatory Visit: Payer: Self-pay

## 2020-08-01 ENCOUNTER — Ambulatory Visit: Payer: Medicare Other | Admitting: Physician Assistant

## 2020-08-01 ENCOUNTER — Other Ambulatory Visit: Payer: Self-pay

## 2020-08-01 VITALS — BP 134/72 | HR 71 | Resp 14 | Ht 64.5 in | Wt 141.6 lb

## 2020-08-01 DIAGNOSIS — M170 Bilateral primary osteoarthritis of knee: Secondary | ICD-10-CM | POA: Diagnosis not present

## 2020-08-01 DIAGNOSIS — M25512 Pain in left shoulder: Secondary | ICD-10-CM

## 2020-08-01 DIAGNOSIS — Z8711 Personal history of peptic ulcer disease: Secondary | ICD-10-CM

## 2020-08-01 DIAGNOSIS — M25562 Pain in left knee: Secondary | ICD-10-CM | POA: Diagnosis not present

## 2020-08-01 DIAGNOSIS — M5134 Other intervertebral disc degeneration, thoracic region: Secondary | ICD-10-CM

## 2020-08-01 DIAGNOSIS — G8929 Other chronic pain: Secondary | ICD-10-CM

## 2020-08-01 DIAGNOSIS — M25561 Pain in right knee: Secondary | ICD-10-CM | POA: Diagnosis not present

## 2020-08-01 DIAGNOSIS — M7061 Trochanteric bursitis, right hip: Secondary | ICD-10-CM

## 2020-08-01 DIAGNOSIS — M19042 Primary osteoarthritis, left hand: Secondary | ICD-10-CM

## 2020-08-01 DIAGNOSIS — Z9889 Other specified postprocedural states: Secondary | ICD-10-CM

## 2020-08-01 DIAGNOSIS — N3010 Interstitial cystitis (chronic) without hematuria: Secondary | ICD-10-CM

## 2020-08-01 DIAGNOSIS — M5136 Other intervertebral disc degeneration, lumbar region: Secondary | ICD-10-CM

## 2020-08-01 DIAGNOSIS — M7062 Trochanteric bursitis, left hip: Secondary | ICD-10-CM

## 2020-08-01 DIAGNOSIS — E785 Hyperlipidemia, unspecified: Secondary | ICD-10-CM

## 2020-08-01 DIAGNOSIS — M810 Age-related osteoporosis without current pathological fracture: Secondary | ICD-10-CM | POA: Diagnosis not present

## 2020-08-01 DIAGNOSIS — M5030 Other cervical disc degeneration, unspecified cervical region: Secondary | ICD-10-CM

## 2020-08-01 DIAGNOSIS — M19041 Primary osteoarthritis, right hand: Secondary | ICD-10-CM | POA: Diagnosis not present

## 2020-08-01 MED ORDER — LIDOCAINE HCL 1 % IJ SOLN
1.5000 mL | INTRAMUSCULAR | Status: AC | PRN
  Administered 2020-08-01: 1.5 mL

## 2020-08-01 MED ORDER — LIDOCAINE HCL 1 % IJ SOLN
1.0000 mL | INTRAMUSCULAR | Status: AC | PRN
  Administered 2020-08-01: 1 mL

## 2020-08-01 MED ORDER — TRIAMCINOLONE ACETONIDE 40 MG/ML IJ SUSP
40.0000 mg | INTRAMUSCULAR | Status: AC | PRN
  Administered 2020-08-01: 40 mg via INTRA_ARTICULAR

## 2020-08-11 ENCOUNTER — Telehealth: Payer: Self-pay

## 2020-08-11 NOTE — Telephone Encounter (Signed)
Noted. Thanks.

## 2020-08-11 NOTE — Telephone Encounter (Signed)
Pt returned call. States she will complete test after 08/23/20. Order is set to expire 08/2021. Will await results. Routing this message back to Dr. Tarri Glenn to make her aware.

## 2020-08-11 NOTE — Telephone Encounter (Signed)
Per Dr. Tarri Glenn request, called pt to follow up re: completion of repeat Hpylori testing after completion of tx't. Order is expiring and would like pt to repeat to ensure resolved. LVM requesting returned call.

## 2020-08-23 ENCOUNTER — Other Ambulatory Visit: Payer: Medicare Other

## 2020-08-23 DIAGNOSIS — A048 Other specified bacterial intestinal infections: Secondary | ICD-10-CM

## 2020-08-24 ENCOUNTER — Other Ambulatory Visit: Payer: Medicare Other

## 2020-08-24 DIAGNOSIS — A048 Other specified bacterial intestinal infections: Secondary | ICD-10-CM

## 2020-08-26 LAB — HELICOBACTER PYLORI  SPECIAL ANTIGEN
MICRO NUMBER:: 11321381
RESULT:: DETECTED — AB
SPECIMEN QUALITY: ADEQUATE

## 2020-09-19 DIAGNOSIS — Z1231 Encounter for screening mammogram for malignant neoplasm of breast: Secondary | ICD-10-CM | POA: Diagnosis not present

## 2020-09-30 ENCOUNTER — Ambulatory Visit: Payer: Medicare Other | Admitting: Gastroenterology

## 2020-09-30 ENCOUNTER — Encounter: Payer: Self-pay | Admitting: Gastroenterology

## 2020-09-30 ENCOUNTER — Other Ambulatory Visit: Payer: Medicare Other

## 2020-09-30 VITALS — BP 108/72 | HR 73 | Ht 64.5 in | Wt 135.4 lb

## 2020-09-30 DIAGNOSIS — B9681 Helicobacter pylori [H. pylori] as the cause of diseases classified elsewhere: Secondary | ICD-10-CM

## 2020-09-30 DIAGNOSIS — K2970 Gastritis, unspecified, without bleeding: Secondary | ICD-10-CM | POA: Diagnosis not present

## 2020-09-30 DIAGNOSIS — K31A11 Gastric intestinal metaplasia without dysplasia, involving the antrum: Secondary | ICD-10-CM

## 2020-09-30 DIAGNOSIS — K2100 Gastro-esophageal reflux disease with esophagitis, without bleeding: Secondary | ICD-10-CM

## 2020-09-30 DIAGNOSIS — R198 Other specified symptoms and signs involving the digestive system and abdomen: Secondary | ICD-10-CM

## 2020-09-30 DIAGNOSIS — R09A2 Foreign body sensation, throat: Secondary | ICD-10-CM

## 2020-09-30 DIAGNOSIS — R0989 Other specified symptoms and signs involving the circulatory and respiratory systems: Secondary | ICD-10-CM

## 2020-09-30 MED ORDER — PYLERA 140-125-125 MG PO CAPS: 3.0000 | ORAL_CAPSULE | Freq: Three times a day (TID) | ORAL | 0 refills | Status: DC

## 2020-09-30 NOTE — Patient Instructions (Addendum)
I have recommended another round of treatment for your persistent H pylori. ° °PRESCRIPTION MEDICATION(S): We have sent the following medication(s) to your pharmacy: ° °• Pylera - Please take 3 capsules by mouth 4 times daily x 10 days ° °• NOTE: If your medication(s) requires a PRIOR AUTHORIZATION, we will receive notification from your pharmacy. Once received, the process to submit for approval may take up to 7-10 business days. You will be contacted about any denials we have received from your insurance company as well as alternatives recommended by your provider. ° °If you symptoms do not improve with successful treatment of H pylori, we will need to proceed with additional evaluation and treatment.  ° °LABS: I am requesting that you repeat an H pylori stool antigen 4 weeks AFTER completing your treatment. To pick up your kit, Press "B" on the elevator. The lab is located at the first door on the left as you exit the elevator. ° °HEALTHCARE LAWS AND MY CHART RESULTS: Due to recent changes in healthcare laws, you may see the results of your imaging and laboratory studies on MyChart before your provider has had a chance to review them.  We understand that in some cases there may be results that are confusing or concerning to you. Not all laboratory results come back in the same time frame and the provider may be waiting for multiple results in order to interpret others.  Please give us 48 hours in order for your provider to thoroughly review all the results before contacting the office for clarification of your results.  ° °FOLLOW UP: ° °Please plan to follow up with me in the office 6-8 weeks after completion of your treatment AND submission of your stool specimen. ° °If you are age 64 or younger, your body mass index should be between 19-25. Your There is no height or weight on file to calculate BMI. If this is out of the aformentioned range listed, please consider follow up with your Primary Care Provider.   ° °Thank you for trusting me with your gastrointestinal care!   ° °Kimberly Beavers, MD, MPH ° ° °

## 2020-09-30 NOTE — Progress Notes (Signed)
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 Deatra Ina 08/01/10 with an 74mm Maloney Constipation - previously prescribed Amitiza 24 mcg BID Prior tobacco use 2006 (10 pack year history) Normal screening colonoscopy 02/2009    - colonoscopy due 02/2019   PLAN: - Insurance will not cover Helidac x 14 days, therefore will treat with Pylera x 10 days - H pylori stool antigen testing 4 weeks after completing therapy - Resume Pantoprazole 40 mg daily if no improvement with H pylori iradiation - Pursue additional evaluation and treatment if symptoms persist despite iradication of H pylori - Plan EGD for intestinal metaplasia mapping after treatment of H pylori - Follow-up in the office in 6-8 weeks  Please see the "Patient Instructions" section for addition details about the plan.  HPI: Brenda Gray is a 60 y.o. female who returns in follow-up. She was last seen 09/19/2018 for  H pylori gastritis diagnosed 08/05/2018 and treated with azithromycin, clarithromycin, and pantroprazole.  Follow-up H pylori testing 08/24/20 was positive.   At the time of her initial consultation 07/18/18 she reported a constant sensation of phlegm in her throat, coughing, and frequent throat clearing.   Esophagram 05/13/2018 for feeling of fullness in the throat, dysphasia, and intermittent clearing of the throat showed a small hiatal  hernia with gastroesophageal reflux, normal esophageal peristalsis, mild delay in the barium pill at the level of the hiatal hernia.  No esophageal narrowing was seen but this was suggested by the radiologist given the delay of the barium pill.   EGD 08/05/18 showed a normal esophagus with distal esophageal biopsies showed reflux. Biopsies were negative for EOE.  Dilation was not performed. Gastric biopsies showed H pylori with intestinal metaplasia.   Despite treatment with antibiotics for H pylori, she has noticed no change in her symptoms. Returns today to further review. Appetite is good. Weight has been stable.   Endoscopic history: Colonoscopy 03/09/09: normal colonoscopy Colonoscopy 07/09/19: diverticulosis EGD 08/05/18: normal esophagus with normal esophageal biopsies. Dilation was not performed.   Past Medical History:  Diagnosis Date  . Arthritis   . Atypical chest pain 06/04/2017  . Cataract    bilateral-removed 2 yrs ago 06/25/19  . Chronic headache S/P CERVICAL FUSION'S  . Cystitis, interstitial    frequency/urgency/nocturia  . DDD (degenerative disc disease), cervical   . Dry eyes   . Frequency of urination   . H. pylori infection   . H/O: upper GI bleed   . Hyperlipidemia   . Mild acid reflux OCCASIONAL--  WATCHES DIET  . Nocturia   . Numbness s/p cerival fusion   right side pain and numbness   . Osteopenia   . Pulmonary nodule noted in 2010   right upper lobe -- stable (monitored by pcp)  . Status post dilation of esophageal narrowing 2011  . Urge incontinence of urine     Past  Surgical History:  Procedure Laterality Date  . CATARACT EXTRACTION, BILATERAL  07/2017   with lens implants  . CERVICAL DISKECTOMY AND FUSION  05-14-2002   C5 - 7  . COLONOSCOPY    . CYSTO WITH HYDRODISTENSION  07/19/2011   Procedure: CYSTOSCOPY/HYDRODISTENSION;  Surgeon: Malka So;  Location: [place];  Service: Urology;  Laterality: N/A;  CYSTOSCOPY WITH  HYDORDISTENTION OF BLADDER AND INSTILLATION OF MARCAINE AND PYRIDIUM  . cysto/ hod  03-10-2009   [facility]  . CYSTOSCOPY N/A 05/01/2013   Procedure: CYSTOSCOPY;  Surgeon: Reece Packer, MD;  Location[facility];  Service: Urology;  Laterality: N/A;  . CYSTOSCOPY WITH INJECTION  06/17/2012   Procedure: CYSTOSCOPY WITH INJECTION;  Surgeon: Reece Packer, MD;  Location: [place];  Service: Urology;  Laterality: N/A;  BOTOX  . CYSTOSCOPY WITH INJECTION N/A 12/08/2012   Procedure: CYSTOSCOPY WITH BOTOX INJECTION;  Surgeon: Reece Packer, MD;  Location: [place];  Service: Urology;  Laterality: N/A;  . laparoscopy ovarian cystectomy  2000 (APPROX)  . left rotator cuff repair  OCT 2009  . POSTERIOR FUSION CERVICAL SPINE  06-29-2009   C4 - 5  . PUBOVAGINAL SLING N/A 05/01/2013   Procedure:  Lowella Dell;  Surgeon: Reece Packer, MD;  Location: [facility];  Service: Urology;  Laterality: N/A;  . ROTATOR CUFF REPAIR  2001   RIGHT SHOULDER  . TONSILLECTOMY  1981  . VAGINAL HYSTERECTOMY  1992    Current Outpatient Medications  Medication Sig Dispense Refill  . ALPRAZolam (XANAX) 0.5 MG tablet Take 0.5 mg by mouth at bedtime.    . Calcium Polycarbophil (FIBER-CAPS PO) Take 5 capsules by mouth 2 (two) times daily.    Marland Kitchen CALCIUM-VITAMIN D PO Take 1 tablet by mouth daily.    . Cetirizine HCl (ZYRTEC ALLERGY PO) Take 1 tablet by mouth daily.     . famotidine-calcium carbonate-magnesium hydroxide (PEPCID COMPLETE) 10-800-165 MG chewable tablet Chew 1 tablet by mouth daily as needed.    . Ginger, Zingiber officinalis, (GINGER PO) Take 1 tablet by mouth daily.     . Multiple Vitamin (MULTIVITAMIN) tablet Take 1 tablet by mouth daily.    . Omega-3 Fatty Acids (FISH OIL) 1000 MG CAPS Take 1 capsule by mouth daily.    . Probiotic Product (PROBIOTIC DAILY PO) Take 1 tablet by mouth daily.     . rosuvastatin (CRESTOR) 20 MG tablet  Take 20 mg by mouth daily.    Marland Kitchen tiZANidine (ZANAFLEX) 4 MG tablet TAKE 1 TABLET(4 MG) BY MOUTH AT BEDTIME AS NEEDED 30 tablet 2  . traMADol (ULTRAM) 50 MG tablet Take 50 mg by mouth every 6 (six) hours as needed for pain.    . TURMERIC PO Take 1 tablet by mouth daily.      Current Facility-Administered Medications  Medication Dose Route Frequency Provider Last Rate Last Admin  . 0.9 %  sodium chloride infusion  500 mL Intravenous Once Thornton Park, MD      . zoledronic acid (RECLAST) injection 5 mg  5 mg Intravenous Once Bo Merino, MD        Allergies as of 09/30/2020 - Review Complete 09/30/2020  Allergen Reaction Noted  . Cymbalta [duloxetine hcl]  11/05/2016  . Lyrica [pregabalin] Itching 11/05/2016    Family History  Problem Relation Age of Onset  . Hypertension Mother   . Heart failure Mother   . Colon polyps Mother   .  Other Father        had to have esogasus dilated  . Heart disease Brother   . Stroke Maternal Grandmother   . Hypertension Brother   . Heart disease Brother   . Healthy Son   . Esophageal cancer Neg Hx   . Colon cancer Neg Hx   . Rectal cancer Neg Hx   . Stomach cancer Neg Hx     Social History   Socioeconomic History  . Marital status: Married    Spouse name: Not on file  . Number of children: 1  . Years of education: Not on file  . Highest education level: Not on file  Occupational History  . Occupation: retired  Tobacco Use  . Smoking status: Former Smoker    Packs/day: 0.10    Years: 20.00    Pack years: 2.00    Types: Cigarettes    Quit date: 07/16/2006    Years since quitting: 14.2  . Smokeless tobacco: Never Used  Vaping Use  . Vaping Use: Never used  Substance and Sexual Activity  . Alcohol use: Yes    Comment: rare  . Drug use: No  . Sexual activity: Not on file  Other Topics Concern  . Not on file  Social History Narrative  . Not on file   Social Determinants of Health   Financial Resource Strain: Not on  file  Food Insecurity: Not on file  Transportation Needs: Not on file  Physical Activity: Not on file  Stress: Not on file  Social Connections: Not on file  Intimate Partner Violence: Not on file    Review of Systems: 12 system ROS is negative except as noted above.   Physical Exam: General:   Alert,  well-nourished, pleasant and cooperative in NAD Head:  Normocephalic and atraumatic. Eyes:  Sclera clear, no icterus.   Conjunctiva pink. Ears:  Normal auditory acuity. Nose:  No deformity, discharge,  or lesions. Mouth:  No deformity or lesions.   Neck:  Supple; no masses or thyromegaly. Lungs:  Clear throughout to auscultation.   No wheezes. Heart:  Regular rate and rhythm; no murmurs. Abdomen:  Soft,nontender, nondistended, normal bowel sounds, no rebound or guarding. No hepatosplenomegaly.   Rectal:  Deferred  Msk:  Symmetrical. No boney deformities LAD: No inguinal or umbilical LAD Extremities:  No clubbing or edema. Neurologic:  Alert and  oriented x4;  grossly nonfocal Skin:  Intact without significant lesions or rashes. Psych:  Alert and cooperative. Normal mood and affect.     Lada Fulbright L. Tarri Glenn, MD, MPH 09/30/2020, 2:57 PM

## 2020-10-03 ENCOUNTER — Ambulatory Visit: Payer: Medicare Other | Admitting: Physician Assistant

## 2020-10-06 ENCOUNTER — Encounter: Payer: Self-pay | Admitting: Physician Assistant

## 2020-10-06 DIAGNOSIS — M8589 Other specified disorders of bone density and structure, multiple sites: Secondary | ICD-10-CM | POA: Diagnosis not present

## 2020-10-07 DIAGNOSIS — D2261 Melanocytic nevi of right upper limb, including shoulder: Secondary | ICD-10-CM | POA: Diagnosis not present

## 2020-10-07 DIAGNOSIS — D2271 Melanocytic nevi of right lower limb, including hip: Secondary | ICD-10-CM | POA: Diagnosis not present

## 2020-10-07 DIAGNOSIS — D2230 Melanocytic nevi of unspecified part of face: Secondary | ICD-10-CM | POA: Diagnosis not present

## 2020-10-07 DIAGNOSIS — D225 Melanocytic nevi of trunk: Secondary | ICD-10-CM | POA: Diagnosis not present

## 2020-10-07 DIAGNOSIS — Z85828 Personal history of other malignant neoplasm of skin: Secondary | ICD-10-CM | POA: Diagnosis not present

## 2020-10-10 ENCOUNTER — Telehealth: Payer: Self-pay | Admitting: *Deleted

## 2020-10-10 NOTE — Telephone Encounter (Signed)
Patient advised for results and recommendations.

## 2020-10-10 NOTE — Telephone Encounter (Signed)
Received DEXA results from [facility].  Date of Scan: 10/06/2020 Lowest T-score and site measured:-2.1, BMD 0.821, AP Spine Significant changes in BMD and site measured (5% and above): N/A  Current Regimen:Reclas 5 mg once, Calcium, Vitamin D  Recommendation: Continue resistive excercises, Vitamin D and calcium, Repeat DEXA in 2 years.

## 2020-10-24 DIAGNOSIS — I10 Essential (primary) hypertension: Secondary | ICD-10-CM | POA: Diagnosis not present

## 2020-11-11 ENCOUNTER — Other Ambulatory Visit: Payer: Medicare Other

## 2020-11-11 DIAGNOSIS — B9681 Helicobacter pylori [H. pylori] as the cause of diseases classified elsewhere: Secondary | ICD-10-CM

## 2020-11-11 DIAGNOSIS — K2970 Gastritis, unspecified, without bleeding: Secondary | ICD-10-CM | POA: Diagnosis not present

## 2020-11-14 LAB — HELICOBACTER PYLORI  SPECIAL ANTIGEN
MICRO NUMBER:: 11608724
SPECIMEN QUALITY: ADEQUATE

## 2020-11-29 ENCOUNTER — Other Ambulatory Visit: Payer: Self-pay

## 2020-11-29 ENCOUNTER — Ambulatory Visit: Payer: Medicare Other | Admitting: Gastroenterology

## 2020-11-29 ENCOUNTER — Encounter: Payer: Self-pay | Admitting: Gastroenterology

## 2020-11-29 ENCOUNTER — Other Ambulatory Visit: Payer: Self-pay | Admitting: Gastroenterology

## 2020-11-29 VITALS — BP 122/74 | HR 66 | Ht 64.5 in | Wt 134.0 lb

## 2020-11-29 DIAGNOSIS — B9681 Helicobacter pylori [H. pylori] as the cause of diseases classified elsewhere: Secondary | ICD-10-CM

## 2020-11-29 DIAGNOSIS — K2970 Gastritis, unspecified, without bleeding: Secondary | ICD-10-CM | POA: Diagnosis not present

## 2020-11-29 DIAGNOSIS — K2100 Gastro-esophageal reflux disease with esophagitis, without bleeding: Secondary | ICD-10-CM | POA: Diagnosis not present

## 2020-11-29 MED ORDER — PANTOPRAZOLE SODIUM 40 MG PO TBEC: 40.0000 mg | DELAYED_RELEASE_TABLET | Freq: Two times a day (BID) | ORAL | 1 refills | Status: DC

## 2020-11-29 MED ORDER — FAMOTIDINE 20 MG PO TABS: 20.0000 mg | ORAL_TABLET | Freq: Two times a day (BID) | ORAL | 1 refills | Status: DC

## 2020-11-29 NOTE — Patient Instructions (Addendum)
I have recommended that you start pantoprazole 40 mg twice daily and famotidine 20 mg twice daily.  If you aren't feeling better by Brenda Gray, please let me know as we would proceed with additional testing.   Please follow-up with Dr. Sharlett Iles with your questions about your thyroid nodule.   The medications have been sent to your pharmacy.  We have scheduled you a follow up with Dr. Tarri Glenn on 01/10/21 at 10:30am.

## 2020-11-29 NOTE — Progress Notes (Signed)
Referring Provider: Leanna Battles, MD Primary Care Physician:  Leanna Battles, MD  Chief complaint:  Throat fullness, H pylori   IMPRESSION:  Ongoing GERD and globus  History of H pylori gastritis 08/05/18    - treated with azithromycin, clarithromycin, and pantoprazole    - History of H pylori + gastric ulcers 2010, bleeding 2011    - intestinal metaplasia on gastric biopsies 2019    - H pylori stool antigen + 08/24/20 despite completing treatment    - Successful iradication with Pylera 2022 Abnormal esophagram 05/13/18    - delayed of barium tablet at the hiatal hernia, no stricture or ring on EGD 08/05/18 History of dysphagia    - empiric dilation by Dr. Deatra Ina 08/01/10 with an 95mm Maloney History of thyroid nodule Constipation - previously prescribed Amitiza 24 mcg BID    - symptomatically improved after iradication of H pylori Prior tobacco use 2006 (10 pack year history) Normal screening colonoscopy 02/2009 and 02/2019   PLAN: - Resume Pantoprazole 40 mg BID and Famotidine 20 mg BID for reflux and globus - Proceed with esophagram +/- 24 hour pH probe and esophageal manometry if reflux and globus are not responding to treatment - Follow-up in the office in 6-8 weeks - Plan EGD for intestinal metaplasia mapping after treatment in the future now that H pylori has been treated - Follow-up with Dr. Sharlett Iles re:- thyroid nodule  Please see the "Patient Instructions" section for addition details about the plan.  HPI: Brenda Gray is a 60 y.o. female who returns in follow-up for H pylori.  At the time of her initial consultation 07/18/18 she reported a constant sensation of phlegm in her throat, coughing, and frequent throat clearing.   Esophagram 05/13/2018 for feeling of fullness in the throat, dysphasia, and intermittent clearing of the throat showed a small hiatal hernia with gastroesophageal reflux, normal esophageal peristalsis, mild delay in the barium pill at the  level of the hiatal hernia.  No esophageal narrowing was seen but this was suggested by the radiologist given the delay of the barium pill.   EGD 08/05/18 showed a normal esophagus with distal esophageal biopsies showed reflux. Biopsies were negative for EOE.  Dilation was not performed. Gastric biopsies showed H pylori with intestinal metaplasia.   She was treated with azithromycin, clarithromycin, and pantroprazole.  Follow-up H pylori testing 08/24/20 was positive.  Insurance would not cover Helidac, so I recommended Pylera, but, her out-of-pocket cost was $300. She completed the antibiotics but has noticed no change in her symptoms except her constipation has resolved.  Follow-up H pylori stool antigen testing was negative.   Today, her primary complaint is she feels like her throat is sore and raw. Feels a choking sensation in her chest. Has frequent throat clearing. Appetite is good. Weight has been stable. Also questions when her thyroid nodule needs to be reevaluated. Symptoms worse after she stopped PPI therapy following H pylori treatment.   She uses Zyrtec for allergies but doesn't think that is related.     Endoscopic history: Colonoscopy 03/09/09: normal colonoscopy Colonoscopy 07/09/19: diverticulosis EGD 08/05/18: normal esophagus with normal esophageal biopsies. Dilation was not performed.   Past Medical History:  Diagnosis Date  . Arthritis   . Atypical chest pain 06/04/2017  . Cataract    bilateral-removed 2 yrs ago 06/25/19  . Chronic headache S/P CERVICAL FUSION'S  . Cystitis, interstitial    frequency/urgency/nocturia  . DDD (degenerative disc disease), cervical   . Dry eyes   .  Frequency of urination   . H. pylori infection   . H/O: upper GI bleed   . Hyperlipidemia   . Mild acid reflux OCCASIONAL--  WATCHES DIET  . Nocturia   . Numbness s/p cerival fusion   right side pain and numbness   . Osteopenia   . Pulmonary nodule noted in 2010   right upper lobe --  stable (monitored by pcp)  . Status post dilation of esophageal narrowing 2011  . Urge incontinence of urine     Past Surgical History:  Procedure Laterality Date  . CATARACT EXTRACTION, BILATERAL  07/2017   with lens implants  . CERVICAL DISKECTOMY AND FUSION  05-14-2002   C5 - 7  . COLONOSCOPY    . CYSTO WITH HYDRODISTENSION  07/19/2011   Procedure: CYSTOSCOPY/HYDRODISTENSION;  Surgeon: Malka So;  Location: [place];  Service: Urology;  Laterality: N/A;  CYSTOSCOPY WITH HYDORDISTENTION OF BLADDER AND INSTILLATION OF MARCAINE AND PYRIDIUM  . cysto/ hod  03-10-2009   [facility]  . CYSTOSCOPY N/A 05/01/2013   Procedure: CYSTOSCOPY;  Surgeon: Reece Packer, MD;  Location: [facility];  Service: Urology;  Laterality: N/A;  . CYSTOSCOPY WITH INJECTION  06/17/2012   Procedure: CYSTOSCOPY WITH INJECTION;  Surgeon: Reece Packer, MD;  Location: [place];  Service: Urology;  Laterality: N/A;  BOTOX  . CYSTOSCOPY WITH INJECTION N/A 12/08/2012   Procedure: CYSTOSCOPY WITH BOTOX INJECTION;  Surgeon: Reece Packer, MD;  Location: [place];  Service: Urology;  Laterality: N/A;  . laparoscopy ovarian cystectomy  2000 (APPROX)  . left rotator cuff repair  OCT 2009  . POSTERIOR FUSION CERVICAL SPINE  06-29-2009   C4 - 5  . PUBOVAGINAL SLING N/A 05/01/2013   Procedure:  Lowella Dell;  Surgeon: Reece Packer, MD;  Location: [facility];  Service: Urology;  Laterality: N/A;  . ROTATOR CUFF REPAIR  2001   RIGHT SHOULDER  . TONSILLECTOMY  1981  . VAGINAL HYSTERECTOMY  1992    Current Outpatient Medications  Medication Sig Dispense Refill  . ALPRAZolam (XANAX) 0.5 MG tablet Take 0.5 mg by mouth at bedtime.    . bismuth-metronidazole-tetracycline (PYLERA) 140-125-125 MG capsule Take 3 capsules by mouth 4 (four) times daily -  before meals and at bedtime for 10 days. 120 capsule 0  . Calcium Polycarbophil  (FIBER-CAPS PO) Take 5 capsules by mouth 2 (two) times daily.    Marland Kitchen CALCIUM-VITAMIN D PO Take 1 tablet by mouth daily.    . Cetirizine HCl (ZYRTEC ALLERGY PO) Take 1 tablet by mouth daily.     . famotidine-calcium carbonate-magnesium hydroxide (PEPCID COMPLETE) 10-800-165 MG chewable tablet Chew 1 tablet by mouth daily as needed.    . Ginger, Zingiber officinalis, (GINGER PO) Take 1 tablet by mouth daily.     . Multiple Vitamin (MULTIVITAMIN) tablet Take 1 tablet by mouth daily.    . Omega-3 Fatty Acids (FISH OIL) 1000 MG CAPS Take 1 capsule by mouth daily.    . Probiotic Product (PROBIOTIC DAILY PO) Take 1 tablet by mouth daily.     . rosuvastatin (CRESTOR) 20 MG tablet Take 20 mg by mouth daily.    Marland Kitchen tiZANidine (ZANAFLEX) 4 MG tablet TAKE 1 TABLET(4 MG) BY MOUTH AT BEDTIME AS NEEDED 30 tablet 2  . traMADol (ULTRAM) 50 MG tablet Take 50 mg by mouth every 6 (six) hours as needed for pain.    . TURMERIC PO Take 1  tablet by mouth daily.      Current Facility-Administered Medications  Medication Dose Route Frequency Provider Last Rate Last Admin  . 0.9 %  sodium chloride infusion  500 mL Intravenous Once Thornton Park, MD      . zoledronic acid (RECLAST) injection 5 mg  5 mg Intravenous Once Bo Merino, MD        Allergies as of 11/29/2020 - Review Complete 09/30/2020  Allergen Reaction Noted  . Cymbalta [duloxetine hcl]  11/05/2016  . Lyrica [pregabalin] Itching 11/05/2016    Family History  Problem Relation Age of Onset  . Hypertension Mother   . Heart failure Mother   . Colon polyps Mother   . Other Father        had to have esogasus dilated  . Heart disease Brother   . Stroke Maternal Grandmother   . Hypertension Brother   . Heart disease Brother   . Healthy Son   . Esophageal cancer Neg Hx   . Colon cancer Neg Hx   . Rectal cancer Neg Hx   . Stomach cancer Neg Hx       Physical Exam: General:   Alert,  well-nourished, pleasant and cooperative in NAD Head:   Normocephalic and atraumatic. Eyes:  Sclera clear, no icterus.   Conjunctiva pink. Abdomen:  Soft, nontender, nondistended, normal bowel sounds, no rebound or guarding. No hepatosplenomegaly.   Neurologic:  Alert and  oriented x4;  grossly nonfocal Skin:  Intact without significant lesions or rashes. Psych:  Alert and cooperative. Normal mood and affect.     Kimberly L. Tarri Glenn, MD, MPH 11/29/2020, 1:28 PM

## 2020-12-17 ENCOUNTER — Other Ambulatory Visit: Payer: Self-pay | Admitting: Rheumatology

## 2020-12-19 NOTE — Telephone Encounter (Signed)
Next Visit: 01/30/2021  Last Visit: 08/01/2020  Last Fill: 06/02/2020  Dx: Age-related osteoporosis without current pathological fracture   Current Dose per office note on 08/01/2020, not mentioned  Okay to refill Zanaflex?

## 2021-01-09 ENCOUNTER — Telehealth: Payer: Self-pay

## 2021-01-09 NOTE — Telephone Encounter (Signed)
Patient called stating she would like to apply for gel injections for her bilateral knees.  Patient states she had Orthovisc injections in 2019.

## 2021-01-10 ENCOUNTER — Ambulatory Visit: Payer: Medicare Other | Admitting: Gastroenterology

## 2021-01-10 ENCOUNTER — Encounter: Payer: Self-pay | Admitting: Gastroenterology

## 2021-01-10 VITALS — BP 120/82 | HR 78 | Ht 64.0 in | Wt 134.0 lb

## 2021-01-10 DIAGNOSIS — K31A15 Gastric intestinal metaplasia without dysplasia, involving multiple sites: Secondary | ICD-10-CM | POA: Diagnosis not present

## 2021-01-10 DIAGNOSIS — K2970 Gastritis, unspecified, without bleeding: Secondary | ICD-10-CM

## 2021-01-10 DIAGNOSIS — R198 Other specified symptoms and signs involving the digestive system and abdomen: Secondary | ICD-10-CM | POA: Diagnosis not present

## 2021-01-10 DIAGNOSIS — R0989 Other specified symptoms and signs involving the circulatory and respiratory systems: Secondary | ICD-10-CM

## 2021-01-10 DIAGNOSIS — B9681 Helicobacter pylori [H. pylori] as the cause of diseases classified elsewhere: Secondary | ICD-10-CM

## 2021-01-10 NOTE — Progress Notes (Signed)
Referring Provider: Leanna Battles, MD Primary Care Physician:  Leanna Battles, MD  Chief complaint:  Throat fullness, H pylori   IMPRESSION:  Ongoing globus     - improved but not relieved on PPI BID and H2B BID History of H pylori gastritis 08/05/18    - treated with azithromycin, clarithromycin, and pantoprazole    - History of H pylori + gastric ulcers 2010, bleeding 2011    - intestinal metaplasia on random gastric biopsies 2019    - H pylori stool antigen + 08/24/20 despite completing treatment    - Successful iradication with Pylera 2022 Abnormal esophagram 05/13/18    - delayed of barium tablet at the hiatal hernia, no stricture or ring on EGD 08/05/18 History of dysphagia    - empiric dilation by Dr. Deatra Ina 08/01/10 with an 73mm [place] History of thyroid nodule    - awaiting follow-up with Dr. Sharlett Iles in the event nodule is contributing to symptoms Constipation - previously prescribed Amitiza 24 mcg BID    - symptomatically improved after iradication of H pylori    - not an active issue today Prior tobacco use 2006 (10 pack year history) Normal screening colonoscopy 02/2009 and 02/2019   PLAN: - Continue Pantoprazole 40 mg BID and Famotidine 20 mg BID for reflux and globus - Consider esophageal manometry, esophageal impedance, and pH testing to exclude esophageal motility disorders if globus does not continue to improve  - Discussed empiric trial of amitriptyline 25 mg QHS for additional symptomatic relief. She will consider. Would need updated EKG prior to prescribing.  - Plan EGD for intestinal metaplasia mapping after treatment in the future now that H pylori has been treated (she will call to schedule after reviewing with her insurance) - Follow-up with Dr. Sharlett Iles re:- thyroid nodule (has appointment this summer) - Follow-up in 3-4 months, earlier if needed  Please see the "Patient Instructions" section for addition details about the plan.  HPI: Brenda Gray is a 60 y.o. female who returns in follow-up.  At the time of her initial consultation 07/18/18 she reported a constant sensation of phlegm in her throat, coughing, and frequent throat clearing.   Esophagram 05/13/2018 for feeling of fullness in the throat, dysphasia, and intermittent clearing of the throat showed a small hiatal hernia with gastroesophageal reflux, normal esophageal peristalsis, mild delay in the barium pill at the level of the hiatal hernia.  No esophageal narrowing was seen but this was suggested by the radiologist given the delay of the barium pill.   EGD 08/05/18 showed a normal esophagus with distal esophageal biopsies showing reflux. Biopsies were negative for EOE.  Dilation was not performed. Gastric biopsies showed H pylori with intestinal metaplasia.   She was treated with azithromycin, clarithromycin, and pantroprazole.  Follow-up H pylori testing 08/24/20 was positive.  Insurance would not cover Helidac, so I recommended Pylera, but, her out-of-pocket cost was $300. She completed the antibiotics but has noticed no change in her symptoms except her constipation has resolved.  Follow-up H pylori stool antigen testing was negative.   At the time of her last office visit 11/29/20, her primary complaint was a sore, raw throat. Feels a choking sensation in her chest. Has frequent throat clearing. Also questions when her thyroid nodule needs to be reevaluated. Symptoms worse after she stopped PPI therapy following H pylori treatment.   She returns today feeling somewhat better on PPI BID and H2B BID. However, she continues to have some intermittent sensation of  globus and need for ongoing throat clearing. Not as disruptive to life as it had been but it remains bothersome. No lateralization of symptoms, dysphagia, odynophagia, neck pain, or dysphonia.  She uses Zyrtec for allergies but doesn't think that is related.   EKG 05/31/17: sinus bradycardia, normal QTc   Endoscopic  history: - Colonoscopy 03/09/09: normal colonoscopy - Colonoscopy 07/09/19: diverticulosis - EGD 08/05/18: normal esophagus with normal esophageal biopsies. Dilation was not performed.   Past Medical History:  Diagnosis Date  . Arthritis   . Atypical chest pain 06/04/2017  . Cataract    bilateral-removed 2 yrs ago 06/25/19  . Chronic headache S/P CERVICAL FUSION'S  . Cystitis, interstitial    frequency/urgency/nocturia  . DDD (degenerative disc disease), cervical   . Dry eyes   . Frequency of urination   . H. pylori infection   . H/O: upper GI bleed   . Hyperlipidemia   . Mild acid reflux OCCASIONAL--  WATCHES DIET  . Nocturia   . Numbness s/p cerival fusion   right side pain and numbness   . Osteopenia   . Pulmonary nodule noted in 2010   right upper lobe -- stable (monitored by pcp)  . Status post dilation of esophageal narrowing 2011  . Urge incontinence of urine     Past Surgical History:  Procedure Laterality Date  . CATARACT EXTRACTION, BILATERAL  07/2017   with lens implants  . CERVICAL DISKECTOMY AND FUSION  05-14-2002   C5 - 7  . COLONOSCOPY    . CYSTO WITH HYDRODISTENSION  07/19/2011   Procedure: CYSTOSCOPY/HYDRODISTENSION;  Surgeon: Malka So;  Location: [facility];  Service: Urology;  Laterality: N/A;  CYSTOSCOPY WITH HYDORDISTENTION OF BLADDER AND INSTILLATION OF MARCAINE AND PYRIDIUM  . cysto/ hod  03-10-2009   [facility]  . CYSTOSCOPY N/A 05/01/2013   Procedure: CYSTOSCOPY;  Surgeon: Reece Packer, MD;  Location: [facility];  Service: Urology;  Laterality: N/A;  . CYSTOSCOPY WITH INJECTION  06/17/2012   Procedure: CYSTOSCOPY WITH INJECTION;  Surgeon: Reece Packer, MD;  Location: [facility];  Service: Urology;  Laterality: N/A;  BOTOX  . CYSTOSCOPY WITH INJECTION N/A 12/08/2012   Procedure: CYSTOSCOPY WITH BOTOX INJECTION;  Surgeon: Reece Packer, MD;  Location: [facility];  Service:  Urology;  Laterality: N/A;  . laparoscopy ovarian cystectomy  2000 (APPROX)  . left rotator cuff repair  OCT 2009  . POSTERIOR FUSION CERVICAL SPINE  06-29-2009   C4 - 5  . PUBOVAGINAL SLING N/A 05/01/2013   Procedure:  Lowella Dell;  Surgeon: Reece Packer, MD;  Location: [facility];  Service: Urology;  Laterality: N/A;  . ROTATOR CUFF REPAIR  2001   RIGHT SHOULDER  . TONSILLECTOMY  1981  . VAGINAL HYSTERECTOMY  1992    Current Outpatient Medications  Medication Sig Dispense Refill  . ALPRAZolam (XANAX) 0.5 MG tablet Take 0.5 mg by mouth at bedtime.    . Calcium Polycarbophil (FIBER-CAPS PO) Take 5 capsules by mouth 2 (two) times daily.    Marland Kitchen CALCIUM-VITAMIN D PO Take 1 tablet by mouth daily.    . Cetirizine HCl (ZYRTEC ALLERGY PO) Take 1 tablet by mouth daily.     . famotidine (PEPCID) 20 MG tablet TAKE 1 TABLET(20 MG) BY MOUTH TWICE DAILY 180 tablet 3  . Ginger, Zingiber officinalis, (GINGER PO) Take 1 tablet by mouth daily.     Marland Kitchen losartan (COZAAR) 50 MG tablet Take  50 mg by mouth daily.    . Multiple Vitamin (MULTIVITAMIN) tablet Take 1 tablet by mouth daily.    . Omega-3 Fatty Acids (FISH OIL) 1000 MG CAPS Take 1 capsule by mouth daily.    . pantoprazole (PROTONIX) 40 MG tablet TAKE 1 TABLET(40 MG) BY MOUTH TWICE DAILY 180 tablet 3  . Probiotic Product (PROBIOTIC DAILY PO) Take 1 tablet by mouth daily.     . rosuvastatin (CRESTOR) 20 MG tablet Take 20 mg by mouth daily.    Marland Kitchen tiZANidine (ZANAFLEX) 4 MG tablet TAKE 1 TABLET(4 MG) BY MOUTH AT BEDTIME AS NEEDED 30 tablet 2  . traMADol (ULTRAM) 50 MG tablet Take 50 mg by mouth every 6 (six) hours as needed for pain.    . TURMERIC PO Take 1 tablet by mouth daily.      Current Facility-Administered Medications  Medication Dose Route Frequency Provider Last Rate Last Admin  . 0.9 %  sodium chloride infusion  500 mL Intravenous Once Thornton Park, MD      . zoledronic acid (RECLAST) injection 5 mg  5 mg Intravenous  Once Bo Merino, MD        Allergies as of 01/10/2021 - Review Complete 01/10/2021  Allergen Reaction Noted  . Cymbalta [duloxetine hcl]  11/05/2016  . Lyrica [pregabalin] Itching 11/05/2016    Family History  Problem Relation Age of Onset  . Hypertension Mother   . Heart failure Mother   . Colon polyps Mother   . Other Father        had to have esogasus dilated  . Heart disease Brother   . Stroke Maternal Grandmother   . Hypertension Brother   . Heart disease Brother   . Healthy Son   . Esophageal cancer Neg Hx   . Colon cancer Neg Hx   . Rectal cancer Neg Hx   . Stomach cancer Neg Hx       Physical Exam: General:   Alert,  well-nourished, pleasant and cooperative in NAD Head:  Normocephalic and atraumatic. Eyes:  Sclera clear, no icterus.   Conjunctiva pink. Neurologic:  Alert and  oriented x4;  grossly nonfocal Skin:  Intact without significant lesions or rashes. Psych:  Alert and cooperative. Normal mood and affect.     Zaiyden Strozier L. Tarri Glenn, MD, MPH 01/10/2021, 10:28 AM

## 2021-01-10 NOTE — Telephone Encounter (Signed)
Ok to reapply for visco gel injections for both knees.

## 2021-01-10 NOTE — Patient Instructions (Addendum)
It was a pleasure to see you today. Based on our discussion, I am providing you with my recommendations below:  RECOMMENDATION(S):   I did not make any changes to regimen today. Please continue what you have been doing. Do not hesitate to call me if you have any questions or concerns.  FOLLOW UP:  . I would like for you to follow up with me in as needed.  BMI:  . If you are age 62 or younger, your body mass index should be between 19-25. Your There is no height or weight on file to calculate BMI. If this is out of the aformentioned range listed, please consider follow up with your Primary Care Provider.   Thank you for trusting me with your gastrointestinal care!    Thornton Park, MD, MPH

## 2021-01-12 DIAGNOSIS — Z23 Encounter for immunization: Secondary | ICD-10-CM | POA: Diagnosis not present

## 2021-01-16 NOTE — Progress Notes (Signed)
Office Visit Note  Patient: Brenda Gray             Date of Birth: [birth year]           MRN: [id number]             PCP: Leanna Battles, MD Referring: Leanna Battles, MD Visit Date: 01/30/2021 Occupation: @GUAROCC @  Subjective:  Other (Left shoulder pain and left hand numbness. )   History of Present Illness: Brenda Gray is a 60 y.o. female with a history of osteoarthritis, degenerative disc disease and osteoporosis.  She had bilateral shoulder joint rotator cuff tear repair by Dr. Marlou Sa several years ago.  She has been having increased pain and discomfort in her left shoulder joint for the last 5 years.  She had an injection in her left shoulder joint in November which was helpful.  The pain has recurred now.  She is having difficulty raising her arm.  She has been also doing some gardening and she relates increased hand discomfort to gardening.  She is having a flare of trochanteric bursitis with difficulty sleeping on her sides.  She has been using some of the machines at the gym.  She had her last Reclast infusion in 2019.   Activities of Daily Living:  Patient reports morning stiffness for 10-15 minutes.    Patient Reports nocturnal pain.  Difficulty dressing/grooming: Denies Difficulty climbing stairs: Denies Difficulty getting out of chair: Denies Difficulty using hands for taps, buttons, cutlery, and/or writing: Denies  Review of Systems  Constitutional: Negative for fatigue.  HENT: Negative for mouth sores, mouth dryness and nose dryness.   Eyes: Negative for pain, itching and dryness.  Respiratory: Negative for shortness of breath and difficulty breathing.   Cardiovascular: Negative for chest pain and palpitations.  Gastrointestinal: Negative for blood in stool, constipation and diarrhea.  Endocrine: Negative for increased urination.  Genitourinary: Negative for difficulty urinating.  Musculoskeletal: Positive for arthralgias, joint pain and morning  stiffness. Negative for joint swelling, myalgias, muscle tenderness and myalgias.  Skin: Negative for color change, rash and redness.  Allergic/Immunologic: Negative for susceptible to infections.  Neurological: Positive for numbness. Negative for dizziness, headaches, memory loss and weakness.  Hematological: Negative for bruising/bleeding tendency.  Psychiatric/Behavioral: Negative for confusion.    PMFS History:  Patient Active Problem List   Diagnosis Date Noted  . Atypical chest pain 06/04/2017  . S/P rotator cuff repair 11/04/2016  . Dyslipidemia 11/04/2016  . History of peptic ulcer disease 11/04/2016  . Interstitial cystitis 11/04/2016  . DJD (degenerative joint disease), cervical 10/29/2016  . Spondylosis of lumbar region without myelopathy or radiculopathy 10/29/2016  . Primary osteoarthritis of both knees 10/29/2016  . 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 Primary insomnia 10/29/2016  . History of recurrent cystitis 10/29/2016  . Primary osteoarthritis of both hands 10/29/2016  . Morton neuroma, right 10/15/2016  . DYSPHAGIA UNSPECIFIED 07/31/2010  . ABDOMINAL PAIN, EPIGASTRIC 07/31/2010    Past Medical History:  Diagnosis Date  . Arthritis   . Atypical chest pain 06/04/2017  . Cataract    bilateral-removed 2 yrs ago 06/25/19  . Chronic headache S/P CERVICAL FUSION'S  . Cystitis, interstitial    frequency/urgency/nocturia  . DDD (degenerative disc disease), cervical   . Dry eyes   . Frequency of urination   . H. pylori infection   . H/O: upper GI bleed   . Hyperlipidemia   . Mild acid reflux OCCASIONAL--  WATCHES DIET  . Nocturia   .  Numbness s/p cerival fusion   right side pain and numbness   . Osteopenia   . Pulmonary nodule noted in 2010   right upper lobe -- stable (monitored by pcp)  . Status post dilation of esophageal narrowing 2011  . Urge incontinence of urine     Family History  Problem Relation Age of Onset  . Hypertension  Mother   . Heart failure Mother   . Colon polyps Mother   . Other Father        had to have esogasus dilated  . Heart disease Brother   . Stroke Maternal Grandmother   . Hypertension Brother   . Heart disease Brother   . Healthy Son   . Esophageal cancer Neg Hx   . Colon cancer Neg Hx   . Rectal cancer Neg Hx   . Stomach cancer Neg Hx    Past Surgical History:  Procedure Laterality Date  . CATARACT EXTRACTION, BILATERAL  07/2017   with lens implants  . CERVICAL DISKECTOMY AND FUSION  05-14-2002   C5 - 7  . COLONOSCOPY    . CYSTO WITH HYDRODISTENSION  07/19/2011   Procedure: CYSTOSCOPY/HYDRODISTENSION;  Surgeon: Malka So;  Location: [facility];  Service: Urology;  Laterality: N/A;  CYSTOSCOPY WITH HYDORDISTENTION OF BLADDER AND INSTILLATION OF MARCAINE AND PYRIDIUM  . cysto/ hod  03-10-2009   [facility]  . CYSTOSCOPY N/A 05/01/2013   Procedure: CYSTOSCOPY;  Surgeon: Reece Packer, MD;  Location: [facility];  Service: Urology;  Laterality: N/A;  . CYSTOSCOPY WITH INJECTION  06/17/2012   Procedure: CYSTOSCOPY WITH INJECTION;  Surgeon: Reece Packer, MD;  Location: [facility];  Service: Urology;  Laterality: N/A;  BOTOX  . CYSTOSCOPY WITH INJECTION N/A 12/08/2012   Procedure: CYSTOSCOPY WITH BOTOX INJECTION;  Surgeon: Reece Packer, MD;  Location: [facility];  Service: Urology;  Laterality: N/A;  . laparoscopy ovarian cystectomy  2000 (APPROX)  . left rotator cuff repair  OCT 2009  . POSTERIOR FUSION CERVICAL SPINE  06-29-2009   C4 - 5  . PUBOVAGINAL SLING N/A 05/01/2013   Procedure:  Lowella Dell;  Surgeon: Reece Packer, MD;  Location: [facility];  Service: Urology;  Laterality: N/A;  . ROTATOR CUFF REPAIR  2001   RIGHT SHOULDER  . TONSILLECTOMY  1981  . VAGINAL HYSTERECTOMY  1992   Social History   Social History Narrative  . Not on file   Immunization History  Administered  Date(s) Administered  . PFIZER(Purple Top)SARS-COV-2 Vaccination 11/27/2019, 12/18/2019, 06/24/2020, 01/12/2021  . Tdap 02/01/2019     Objective: Vital Signs: BP 124/76 (BP Location: Left Arm, Patient Position: Sitting, Cuff Size: Normal)   Pulse 60   Resp 15   Ht 5' 4.5" (1.638 m)   Wt 136 lb 12.8 oz (62.1 kg)   BMI 23.12 kg/m    Physical Exam Vitals and nursing note reviewed.  Constitutional:      Appearance: She is well-developed.  HENT:     Head: Normocephalic and atraumatic.  Eyes:     Conjunctiva/sclera: Conjunctivae normal.  Cardiovascular:     Rate and Rhythm: Normal rate and regular rhythm.     Heart sounds: Normal heart sounds.  Pulmonary:     Effort: Pulmonary effort is normal.     Breath sounds: Normal breath sounds.  Abdominal:     General: Bowel sounds are normal.     Palpations: Abdomen is soft.  Musculoskeletal:  Cervical back: Normal range of motion.  Lymphadenopathy:     Cervical: No cervical adenopathy.  Skin:    General: Skin is warm and dry.     Capillary Refill: Capillary refill takes less than 2 seconds.  Neurological:     Mental Status: She is alert and oriented to person, place, and time.  Psychiatric:        Behavior: Behavior normal.      Musculoskeletal Exam: She has stiffness and limited range of motion of her cervical spine.  There was no tenderness over thoracic or lumbar spine.  She had discomfort range of motion of her left shoulder joint.  Right shoulder joint was in full range of motion.  She had tenderness over left subacromial region.  Elbow joints, wrist joints were in good range of motion.  She had bilateral PIP and DIP thickening consistent with osteoarthritis.  Hip joints with good range of motion.  She had bilateral trochanteric bursitis.  Knee joints are in good range of motion.  There was no tenderness over ankles or MTPs.  CDAI Exam: CDAI Score: -- Patient Global: --; Provider Global: -- Swollen: --; Tender: -- Joint  Exam 01/30/2021   No joint exam has been documented for this visit   There is currently no information documented on the homunculus. Go to the Rheumatology activity and complete the homunculus joint exam.  Investigation: No additional findings.  Imaging: No results found.  Recent Labs: Lab Results  Component Value Date   WBC 5.3 11/12/2017   HGB 13.6 11/12/2017   PLT 250 11/12/2017   NA 142 12/17/2017   K 4.7 12/17/2017   CL 107 12/17/2017   CO2 31 12/17/2017   GLUCOSE 89 12/17/2017   BUN 15 12/17/2017   CREATININE 0.77 12/17/2017   BILITOT 0.4 12/17/2017   ALKPHOS 65 11/05/2016   AST 25 12/17/2017   ALT 17 12/17/2017   PROT 6.5 12/17/2017   ALBUMIN 4.3 11/05/2016   CALCIUM 9.6 12/17/2017   GFRAA 98 12/17/2017    Speciality Comments: Dexa: 09/08/18 Osteopenia T-Score -2.2 BMD 0.800 Hold reclast  Procedures:  Large Joint Inj: bilateral knee on 01/30/2021 8:13 AM Indications: pain Details: 25 G 1.5 in needle, medial approach  Arthrogram: No  Medications (Right): 1.5 mL lidocaine 1 %; 16 mg Hylan 16 MG/2ML Aspirate (Right): 0 mL Medications (Left): 1.5 mL lidocaine 1 %; 16 mg Hylan 16 MG/2ML Aspirate (Left): 0 mL Outcome: tolerated well, no immediate complications Procedure, treatment alternatives, risks and benefits explained, specific risks discussed. Consent was given by the patient. Immediately prior to procedure a time out was called to verify the correct patient, procedure, equipment, support staff and site/side marked as required. Patient was prepped and draped in the usual sterile fashion.   Large Joint Inj: L subacromial bursa on 01/30/2021 8:36 AM Indications: pain Details: 27 G 1.5 in needle, posterior approach  Arthrogram: No  Medications: 40 mg triamcinolone acetonide 40 MG/ML; 1.5 mL lidocaine 1 % Aspirate: 0 mL Outcome: tolerated well, no immediate complications Procedure, treatment alternatives, risks and benefits explained, specific risks  discussed. Consent was given by the patient. Immediately prior to procedure a time out was called to verify the correct patient, procedure, equipment, support staff and site/side marked as required. Patient was prepped and draped in the usual sterile fashion.     Allergies: Cymbalta [duloxetine hcl] and Lyrica [pregabalin]   Assessment / Plan:     Visit Diagnoses: Age-related osteoporosis without current pathological fracture - DEXA 10/06/2020  T-score:-2.1, BMD 0.821, AP Spine. DEXA on 09/08/2018 AP spine BMD 0.800 with T score -2.2. Her last Reclast IV infusion was on April 2019.  I reviewed her DEXA findings with her today.  She would like to have repeat Reclast infusion this fall.  Use of calcium rich diet, vitamin D and resistive exercises were discussed  Primary osteoarthritis of both hands-joint protection muscle strengthening was discussed.  Primary osteoarthritis of both knees-she continues to have pain and discomfort in her knee joints.  She had good response to Visco supplement injections in the past.  She came today to get her first Visco supplement injection to bilateral knee joints.  Knee joints were injected as described above.  She tolerated the procedure well.  Postprocedure instructions were discussed.  S/P rotator cuff repair - Bilateral by Dr. Marlou Sa.  Chronic left shoulder pain -she had a good response to left subacromial injection in November 2021.  She states the pain has recurred now.  After informed consent was obtained and different treatment options were discussed left shoulder joint was injected with cortisone as described above.  She tolerated the procedure well.  Postprocedure instructions were given.  Have also referred her to physical therapy.    Trochanteric bursitis of both hips -she continues to have off-and-on discomfort in the trochanteric bursa.  She has difficulty sleeping on the side.  She has been working out on a regular basis.  I gave her a handout on IT band  stretches.  Also refer her to physical therapy.  DDD (degenerative disc disease), cervical-she has limited range of motion with stiffness but not much discomfort.  DDD (degenerative disc disease), thoracic-she had no tenderness on examination.  DDD (degenerative disc disease), lumbar - epidural steroid injection performed by Dr. Ernestina Patches on 02/10/2020 and a radiofrequency ablation performed on 06/22/20.  Lower back pain is manageable currently.  Other medical problems are listed as follows:  History of peptic ulcer disease  Interstitial cystitis  Dyslipidemia  Orders: Orders Placed This Encounter  Procedures  . Large Joint Inj: bilateral knee  . Large Joint Inj   No orders of the defined types were placed in this encounter.    Follow-Up Instructions: Return in about 6 months (around 08/02/2021) for Osteoporosis, Osteoarthritis.   Bo Merino, MD  Note - This record has been created using Editor, commissioning.  Chart creation errors have been sought, but may not always  have been located. Such creation errors do not reflect on  the standard of medical care.

## 2021-01-19 NOTE — Telephone Encounter (Signed)
I contacted Judie Petit in our insurance department to get administration cost for each injection. Per Tisha code [id number] is filed under Part B, not Part D.  I called [place] [employer], and spoke to [employer]. Synvisc is a preferred drug with patients plan. We can [employer]. It is filed under Part B, not Part D. We can go forward with injections.   Authorized for Synvisc series Bilateral Knees. Buy and Bill. Deductible does not apply. PA not required for preferred product, per Cyndia Bent with [facility]. Insurance to cover 100% of allowed amount for administration with a $20.00 copay, and 80% of allowable cost for product.

## 2021-01-19 NOTE — Telephone Encounter (Signed)
[employer] denied request for Synvisc stating the Social Security Act permits the exclusion of certain drugs or classes of drugs from coverage under Part D. Synvisc is a surgical/ medical supply, which is one of the excluded classes of drugs listed in the [place]. Synvisc is a preferred product, but it is being denied. Please advise patient of next step.

## 2021-01-30 ENCOUNTER — Other Ambulatory Visit: Payer: Self-pay

## 2021-01-30 ENCOUNTER — Encounter: Payer: Self-pay | Admitting: Rheumatology

## 2021-01-30 ENCOUNTER — Ambulatory Visit: Payer: Medicare Other | Admitting: Rheumatology

## 2021-01-30 VITALS — BP 124/76 | HR 60 | Resp 15 | Ht 64.5 in | Wt 136.8 lb

## 2021-01-30 DIAGNOSIS — Z9889 Other specified postprocedural states: Secondary | ICD-10-CM | POA: Diagnosis not present

## 2021-01-30 DIAGNOSIS — M19042 Primary osteoarthritis, left hand: Secondary | ICD-10-CM

## 2021-01-30 DIAGNOSIS — M5136 Other intervertebral disc degeneration, lumbar region: Secondary | ICD-10-CM

## 2021-01-30 DIAGNOSIS — M25512 Pain in left shoulder: Secondary | ICD-10-CM | POA: Diagnosis not present

## 2021-01-30 DIAGNOSIS — M810 Age-related osteoporosis without current pathological fracture: Secondary | ICD-10-CM

## 2021-01-30 DIAGNOSIS — M170 Bilateral primary osteoarthritis of knee: Secondary | ICD-10-CM | POA: Diagnosis not present

## 2021-01-30 DIAGNOSIS — E785 Hyperlipidemia, unspecified: Secondary | ICD-10-CM

## 2021-01-30 DIAGNOSIS — N3010 Interstitial cystitis (chronic) without hematuria: Secondary | ICD-10-CM

## 2021-01-30 DIAGNOSIS — M5134 Other intervertebral disc degeneration, thoracic region: Secondary | ICD-10-CM

## 2021-01-30 DIAGNOSIS — M19041 Primary osteoarthritis, right hand: Secondary | ICD-10-CM | POA: Diagnosis not present

## 2021-01-30 DIAGNOSIS — M7061 Trochanteric bursitis, right hip: Secondary | ICD-10-CM

## 2021-01-30 DIAGNOSIS — Z8711 Personal history of peptic ulcer disease: Secondary | ICD-10-CM

## 2021-01-30 DIAGNOSIS — M7062 Trochanteric bursitis, left hip: Secondary | ICD-10-CM

## 2021-01-30 DIAGNOSIS — G8929 Other chronic pain: Secondary | ICD-10-CM | POA: Diagnosis not present

## 2021-01-30 DIAGNOSIS — M5030 Other cervical disc degeneration, unspecified cervical region: Secondary | ICD-10-CM

## 2021-01-30 MED ORDER — LIDOCAINE HCL 1 % IJ SOLN
1.5000 mL | INTRAMUSCULAR | Status: AC | PRN
  Administered 2021-01-30: 1.5 mL

## 2021-01-30 MED ORDER — TRIAMCINOLONE ACETONIDE 40 MG/ML IJ SUSP
40.0000 mg | INTRAMUSCULAR | Status: AC | PRN
  Administered 2021-01-30: 40 mg via INTRA_ARTICULAR

## 2021-01-30 MED ORDER — HYLAN G-F 20 16 MG/2ML IX SOSY
16.0000 mg | PREFILLED_SYRINGE | INTRA_ARTICULAR | Status: AC | PRN
  Administered 2021-01-30: 16 mg via INTRA_ARTICULAR

## 2021-01-30 NOTE — Addendum Note (Signed)
Addended by: Francis Gaines C on: 01/30/2021 11:00 AM   Modules accepted: Orders

## 2021-01-30 NOTE — Patient Instructions (Signed)
Shoulder Exercises Ask your health care provider which exercises are safe for you. Do exercises exactly as told by your health care provider and adjust them as directed. It is normal to feel mild stretching, pulling, tightness, or discomfort as you do these exercises. Stop right away if you feel sudden pain or your pain gets worse. Do not begin these exercises until told by your health care provider. Stretching exercises External rotation and abduction This exercise is sometimes called corner stretch. This exercise rotates your arm outward (external rotation) and moves your arm out from your body (abduction). 1. Stand in a doorway with one of your feet slightly in front of the other. This is called a staggered stance. If you cannot reach your forearms to the door frame, stand facing a corner of a room. 2. Choose one of the following positions as told by your health care provider: ? Place your hands and forearms on the door frame above your head. ? Place your hands and forearms on the door frame at the height of your head. ? Place your hands on the door frame at the height of your elbows. 3. Slowly move your weight onto your front foot until you feel a stretch across your chest and in the front of your shoulders. Keep your head and chest upright and keep your abdominal muscles tight. 4. Hold for __________ seconds. 5. To release the stretch, shift your weight to your back foot. Repeat __________ times. Complete this exercise __________ times a day.   Extension, standing 1. Stand and hold a broomstick, a cane, or a similar object behind your back. ? Your hands should be a little wider than shoulder width apart. ? Your palms should face away from your back. 2. Keeping your elbows straight and your shoulder muscles relaxed, move the stick away from your body until you feel a stretch in your shoulders (extension). ? Avoid shrugging your shoulders while you move the stick. Keep your shoulder blades  tucked down toward the middle of your back. 3. Hold for __________ seconds. 4. Slowly return to the starting position. Repeat __________ times. Complete this exercise __________ times a day. Range-of-motion exercises Pendulum 1. Stand near a wall or a surface that you can hold onto for balance. 2. Bend at the waist and let your left / right arm hang straight down. Use your other arm to support you. Keep your back straight and do not lock your knees. 3. Relax your left / right arm and shoulder muscles, and move your hips and your trunk so your left / right arm swings freely. Your arm should swing because of the motion of your body, not because you are using your arm or shoulder muscles. 4. Keep moving your hips and trunk so your arm swings in the following directions, as told by your health care provider: ? Side to side. ? Forward and backward. ? In clockwise and counterclockwise circles. 5. Continue each motion for __________ seconds, or for as long as told by your health care provider. 6. Slowly return to the starting position. Repeat __________ times. Complete this exercise __________ times a day.   Shoulder flexion, standing 1. Stand and hold a broomstick, a cane, or a similar object. Place your hands a little more than shoulder width apart on the object. Your left / right hand should be palm up, and your other hand should be palm down. 2. Keep your elbow straight and your shoulder muscles relaxed. Push the stick up with your healthy   arm to raise your left / right arm in front of your body, and then over your head until you feel a stretch in your shoulder (flexion). ? Avoid shrugging your shoulder while you raise your arm. Keep your shoulder blade tucked down toward the middle of your back. 3. Hold for __________ seconds. 4. Slowly return to the starting position. Repeat __________ times. Complete this exercise __________ times a day.   Shoulder abduction, standing 1. Stand and hold a  broomstick, a cane, or a similar object. Place your hands a little more than shoulder width apart on the object. Your left / right hand should be palm up, and your other hand should be palm down. 2. Keep your elbow straight and your shoulder muscles relaxed. Push the object across your body toward your left / right side. Raise your left / right arm to the side of your body (abduction) until you feel a stretch in your shoulder. ? Do not raise your arm above shoulder height unless your health care provider tells you to do that. ? If directed, raise your arm over your head. ? Avoid shrugging your shoulder while you raise your arm. Keep your shoulder blade tucked down toward the middle of your back. 3. Hold for __________ seconds. 4. Slowly return to the starting position. Repeat __________ times. Complete this exercise __________ times a day. Internal rotation 1. Place your left / right hand behind your back, palm up. 2. Use your other hand to dangle an exercise band, a towel, or a similar object over your shoulder. Grasp the band with your left / right hand so you are holding on to both ends. 3. Gently pull up on the band until you feel a stretch in the front of your left / right shoulder. The movement of your arm toward the center of your body is called internal rotation. ? Avoid shrugging your shoulder while you raise your arm. Keep your shoulder blade tucked down toward the middle of your back. 4. Hold for __________ seconds. 5. Release the stretch by letting go of the band and lowering your hands. Repeat __________ times. Complete this exercise __________ times a day.   Strengthening exercises External rotation 1. Sit in a stable chair without armrests. 2. Secure an exercise band to a stable object at elbow height on your left / right side. 3. Place a soft object, such as a folded towel or a small pillow, between your left / right upper arm and your body to move your elbow about 4 inches (10  cm) away from your side. 4. Hold the end of the exercise band so it is tight and there is no slack. 5. Keeping your elbow pressed against the soft object, slowly move your forearm out, away from your abdomen (external rotation). Keep your body steady so only your forearm moves. 6. Hold for __________ seconds. 7. Slowly return to the starting position. Repeat __________ times. Complete this exercise __________ times a day.   Shoulder abduction 1. Sit in a stable chair without armrests, or stand up. 2. Hold a __________ weight in your left / right hand, or hold an exercise band with both hands. 3. Start with your arms straight down and your left / right palm facing in, toward your body. 4. Slowly lift your left / right hand out to your side (abduction). Do not lift your hand above shoulder height unless your health care provider tells you that this is safe. ? Keep your arms straight. ? Avoid   shrugging your shoulder while you do this movement. Keep your shoulder blade tucked down toward the middle of your back. 5. Hold for __________ seconds. 6. Slowly lower your arm, and return to the starting position. Repeat __________ times. Complete this exercise __________ times a day.   Shoulder extension 1. Sit in a stable chair without armrests, or stand up. 2. Secure an exercise band to a stable object in front of you so it is at shoulder height. 3. Hold one end of the exercise band in each hand. Your palms should face each other. 4. Straighten your elbows and lift your hands up to shoulder height. 5. Step back, away from the secured end of the exercise band, until the band is tight and there is no slack. 6. Squeeze your shoulder blades together as you pull your hands down to the sides of your thighs (extension). Stop when your hands are straight down by your sides. Do not let your hands go behind your body. 7. Hold for __________ seconds. 8. Slowly return to the starting position. Repeat __________  times. Complete this exercise __________ times a day. Shoulder row 1. Sit in a stable chair without armrests, or stand up. 2. Secure an exercise band to a stable object in front of you so it is at waist height. 3. Hold one end of the exercise band in each hand. Position your palms so that your thumbs are facing the ceiling (neutral position). 4. Bend each of your elbows to a 90-degree angle (right angle) and keep your upper arms at your sides. 5. Step back until the band is tight and there is no slack. 6. Slowly pull your elbows back behind you. 7. Hold for __________ seconds. 8. Slowly return to the starting position. Repeat __________ times. Complete this exercise __________ times a day. Shoulder press-ups 1. Sit in a stable chair that has armrests. Sit upright, with your feet flat on the floor. 2. Put your hands on the armrests so your elbows are bent and your fingers are pointing forward. Your hands should be about even with the sides of your body. 3. Push down on the armrests and use your arms to lift yourself off the chair. Straighten your elbows and lift yourself up as much as you comfortably can. ? Move your shoulder blades down, and avoid letting your shoulders move up toward your ears. ? Keep your feet on the ground. As you get stronger, your feet should support less of your body weight as you lift yourself up. 4. Hold for __________ seconds. 5. Slowly lower yourself back into the chair. Repeat __________ times. Complete this exercise __________ times a day.   Wall push-ups 1. Stand so you are facing a stable wall. Your feet should be about one arm-length away from the wall. 2. Lean forward and place your palms on the wall at shoulder height. 3. Keep your feet flat on the floor as you bend your elbows and lean forward toward the wall. 4. Hold for __________ seconds. 5. Straighten your elbows to push yourself back to the starting position. Repeat __________ times. Complete this  exercise __________ times a day.   This information is not intended to replace advice given to you by your health care provider. Make sure you discuss any questions you have with your health care provider. Document Revised: 12/19/2018 Document Reviewed: 09/26/2018 Elsevier Patient Education  [place] [facility] Ask your health care provider which exercises are safe for you. Do exercises exactly  as told by your health care provider and adjust them as directed. It is normal to feel mild stretching, pulling, tightness, or discomfort as you do these exercises. Stop right away if you feel sudden pain or your pain gets significantly worse. Do not begin these exercises until told by your health care provider. Stretching and range-of-motion exercises These exercises warm up your muscles and joints and improve the movement and flexibility of your hip and pelvis. Quadriceps stretch, prone 6. Lie on your abdomen (prone position) on a firm surface, such as a bed or padded floor. 7. Bend your left / right knee and reach back to hold your ankle or pant leg. If you cannot reach your ankle or pant leg, loop a belt around your foot and grab the belt instead. 8. Gently pull your heel toward your buttocks. Your knee should not slide out to the side. You should feel a stretch in the front of your thigh and knee (quadriceps). 9. Hold this position for __________ seconds. Repeat __________ times. Complete this exercise __________ times a day.   Iliotibial band stretch An iliotibial band is a strong band of muscle tissue that runs from the outer side of your hip to the outer side of your thigh and knee. 5. Lie on your side with your left / right leg in the top position. 6. Bend both of your knees and grab your left / right ankle. Stretch out your bottom arm to help you balance. 7. Slowly bring your top knee back so your thigh goes behind your trunk. 8. Slowly lower your top leg toward the  floor until you feel a gentle stretch on the outside of your left / right hip and thigh. If you do not feel a stretch and your knee will not fall farther, place the heel of your other foot on top of your knee and pull your knee down toward the floor with your foot. 9. Hold this position for __________ seconds. Repeat __________ times. Complete this exercise __________ times a day.   Strengthening exercises These exercises build strength and endurance in your hip and pelvis. Endurance is the ability to use your muscles for a long time, even after they get tired. Straight leg raises, side-lying This exercise strengthens the muscles that rotate the leg at the hip and move it away from your body (hip abductors). 7. Lie on your side with your left / right leg in the top position. Lie so your head, shoulder, hip, and knee line up. You may bend your bottom knee to help you balance. 8. Roll your hips slightly forward so your hips are stacked directly over each other and your left / right knee is facing forward. 9. Tense the muscles in your outer thigh and lift your top leg 4-6 inches (10-15 cm). 10. Hold this position for __________ seconds. 11. Slowly lower your leg to return to the starting position. Let your muscles relax completely before doing another repetition. Repeat __________ times. Complete this exercise __________ times a day.   Leg raises, prone This exercise strengthens the muscles that move the hips backward (hip extensors). 5. Lie on your abdomen (prone position) on your bed or a firm surface. You can put a pillow under your hips if that is more comfortable for your lower back. 6. Bend your left / right knee so your foot is straight up in the air. 7. Squeeze your buttocks muscles and lift your left / right thigh off the bed. Do not let  your back arch. 8. Tense your thigh muscle as hard as you can without increasing any knee pain. 9. Hold this position for __________ seconds. 10. Slowly  lower your leg to return to the starting position and allow it to relax completely. Repeat __________ times. Complete this exercise __________ times a day. Hip hike 5. Stand sideways on a bottom step. Stand on your left / right leg with your other foot unsupported next to the step. You can hold on to a railing or wall for balance if needed. 6. Keep your knees straight and your torso square. Then lift your left / right hip up toward the ceiling. 7. Slowly let your left / right hip lower toward the floor, past the starting position. Your foot should get closer to the floor. Do not lean or bend your knees. Repeat __________ times. Complete this exercise __________ times a day. This information is not intended to replace advice given to you by your health care provider. Make sure you discuss any questions you have with your health care provider. Document Revised: 11/04/2019 Document Reviewed: 11/04/2019 Elsevier Patient Education  [place].

## 2021-02-07 ENCOUNTER — Other Ambulatory Visit: Payer: Self-pay

## 2021-02-07 ENCOUNTER — Ambulatory Visit: Payer: Medicare Other | Admitting: Physician Assistant

## 2021-02-07 DIAGNOSIS — M170 Bilateral primary osteoarthritis of knee: Secondary | ICD-10-CM | POA: Diagnosis not present

## 2021-02-07 MED ORDER — HYLAN G-F 20 16 MG/2ML IX SOSY
16.0000 mg | PREFILLED_SYRINGE | INTRA_ARTICULAR | Status: AC | PRN
  Administered 2021-02-07: 16 mg via INTRA_ARTICULAR

## 2021-02-07 MED ORDER — LIDOCAINE HCL 1 % IJ SOLN
1.5000 mL | INTRAMUSCULAR | Status: AC | PRN
  Administered 2021-02-07: 1.5 mL

## 2021-02-07 MED ORDER — HYLAN G-F 20 16 MG/2ML IX SOSY
16.0000 mg | PREFILLED_SYRINGE | INTRA_ARTICULAR | Status: AC | PRN
Start: 2021-02-07 — End: 2021-02-07
  Administered 2021-02-07: 16 mg via INTRA_ARTICULAR

## 2021-02-07 NOTE — Progress Notes (Signed)
   Procedure Note  Patient: Brenda Gray             Date of Birth: [birth year]           MRN: [id number]             Visit Date: 02/07/2021  Procedures: Visit Diagnoses:  1. Primary osteoarthritis of both knees    Synvisc #2 bilateral knee joint injections  Large Joint Inj: bilateral knee on 02/07/2021 8:31 AM Indications: pain Details: 25 G 1.5 in needle, medial approach  Arthrogram: No  Medications (Right): 1.5 mL lidocaine 1 %; 16 mg Hylan 16 MG/2ML Aspirate (Right): 0 mL Medications (Left): 1.5 mL lidocaine 1 %; 16 mg Hylan 16 MG/2ML Aspirate (Left): 0 mL Outcome: tolerated well, no immediate complications Procedure, treatment alternatives, risks and benefits explained, specific risks discussed. Consent was given by the patient. Immediately prior to procedure a time out was called to verify the correct patient, procedure, equipment, support staff and site/side marked as required. Patient was prepped and draped in the usual sterile fashion.      Patient tolerated the procedure well.  Aftercare was discussed.  Hazel Sams, PA-C

## 2021-02-13 ENCOUNTER — Ambulatory Visit: Payer: Medicare Other | Admitting: Physician Assistant

## 2021-02-13 ENCOUNTER — Other Ambulatory Visit: Payer: Self-pay

## 2021-02-13 DIAGNOSIS — M170 Bilateral primary osteoarthritis of knee: Secondary | ICD-10-CM

## 2021-02-13 MED ORDER — HYLAN G-F 20 16 MG/2ML IX SOSY
16.0000 mg | PREFILLED_SYRINGE | INTRA_ARTICULAR | Status: AC | PRN
  Administered 2021-02-13: 16 mg via INTRA_ARTICULAR

## 2021-02-13 MED ORDER — LIDOCAINE HCL 1 % IJ SOLN
1.5000 mL | INTRAMUSCULAR | Status: AC | PRN
  Administered 2021-02-13: 1.5 mL

## 2021-02-13 NOTE — Progress Notes (Signed)
   Procedure Note  Patient: Brenda Gray             Date of Birth: [birth year]           MRN: [id number]             Visit Date: 02/13/2021  Procedures: Visit Diagnoses:  1. Primary osteoarthritis of both knees      SYNVISC #3 BILATERAL KNEES, B/B  Large Joint Inj: bilateral knee on 02/13/2021 9:16 AM Indications: pain Details: 25 G 1.5 in needle, medial approach  Arthrogram: No  Medications (Right): 1.5 mL lidocaine 1 %; 16 mg Hylan 16 MG/2ML Aspirate (Right): 0 mL Medications (Left): 1.5 mL lidocaine 1 %; 16 mg Hylan 16 MG/2ML Aspirate (Left): 0 mL Outcome: tolerated well, no immediate complications Procedure, treatment alternatives, risks and benefits explained, specific risks discussed. Consent was given by the patient. Immediately prior to procedure a time out was called to verify the correct patient, procedure, equipment, support staff and site/side marked as required. Patient was prepped and draped in the usual sterile fashion.     Patient tolerated the procedure well. Aftercare was discussed.  Hazel Sams, PA-C

## 2021-04-03 DIAGNOSIS — Z961 Presence of intraocular lens: Secondary | ICD-10-CM | POA: Diagnosis not present

## 2021-04-03 DIAGNOSIS — H524 Presbyopia: Secondary | ICD-10-CM | POA: Diagnosis not present

## 2021-05-11 DIAGNOSIS — M810 Age-related osteoporosis without current pathological fracture: Secondary | ICD-10-CM | POA: Diagnosis not present

## 2021-05-11 DIAGNOSIS — E785 Hyperlipidemia, unspecified: Secondary | ICD-10-CM | POA: Diagnosis not present

## 2021-05-18 DIAGNOSIS — R82998 Other abnormal findings in urine: Secondary | ICD-10-CM | POA: Diagnosis not present

## 2021-05-19 ENCOUNTER — Other Ambulatory Visit: Payer: Self-pay | Admitting: Internal Medicine

## 2021-05-19 DIAGNOSIS — E041 Nontoxic single thyroid nodule: Secondary | ICD-10-CM

## 2021-05-24 DIAGNOSIS — Z1212 Encounter for screening for malignant neoplasm of rectum: Secondary | ICD-10-CM | POA: Diagnosis not present

## 2021-05-26 ENCOUNTER — Ambulatory Visit
Admission: RE | Admit: 2021-05-26 | Discharge: 2021-05-26 | Disposition: A | Discharge status: Home / Self Care | Payer: Medicare Other | Source: Ambulatory Visit | Attending: Internal Medicine | Admitting: Internal Medicine

## 2021-05-26 DIAGNOSIS — E041 Nontoxic single thyroid nodule: Secondary | ICD-10-CM | POA: Diagnosis not present

## 2021-06-15 ENCOUNTER — Other Ambulatory Visit: Payer: Self-pay | Admitting: Physician Assistant

## 2021-06-16 NOTE — Telephone Encounter (Signed)
Next Visit: 07/31/2021  Last Visit: 01/30/2021  Last Fill: 12/19/2020  Current Dose per office note on 01/30/2021: not discussed  Okay to refill Tizanidine?

## 2021-07-17 NOTE — Progress Notes (Signed)
Office Visit Note  Patient: Brenda Gray             Date of Birth: 1961/01/28           MRN: [id number]             PCP: Leanna Battles, MD Referring: Leanna Battles, MD Visit Date: 07/31/2021 Occupation: @GUAROCC @  Subjective:  Left shoulder pain   History of Present Illness: Brenda Gray is a 61 y.o. female with history of osteoarthritis and osteoporosis.  Patient presents today with increased pain in the left shoulder.  She has history of left rotator cuff repair by Dr. Marlou Sa in the past.  She continues to have recurrent issues with left subacromial bursitis.  Her last cortisone injection was performed on 01/30/2021 which provided temporary relief.  She has been experiencing increased nocturnal pain and stiffness despite performing stretching exercises on a daily basis.  She would like a cortisone injection today. She states that both knees are doing well after undergoing Visco gel injections in May/June 2022.  She denies any knee joint swelling.  She is not ready to proceed with Visco at this time since her knee joints feel significantly better after the last series.  She continues to workout at least 4 days a week at the senior center. Patient reports that she has been taking calcium and vitamin D as recommended.  She denies any recent falls or fractures.  She would like to proceed with her next Reclast IV infusion.  She previously tolerated Reclast without any side effects.  She has been performing resistive and strengthening exercises on a daily basis.   Activities of Daily Living:  Patient reports morning stiffness for 1 hour.   Patient Reports nocturnal pain.  Difficulty dressing/grooming: Denies Difficulty climbing stairs: Denies Difficulty getting out of chair: Denies Difficulty using hands for taps, buttons, cutlery, and/or writing: Reports  Review of Systems  Constitutional:  Negative for fatigue.  HENT:  Negative for mouth sores, mouth dryness and nose  dryness.   Eyes:  Negative for pain, visual disturbance and dryness.  Respiratory:  Negative for cough, hemoptysis, shortness of breath and difficulty breathing.   Cardiovascular:  Negative for chest pain, palpitations, hypertension and swelling in legs/feet.  Gastrointestinal:  Negative for blood in stool, constipation and diarrhea.  Endocrine: Positive for heat intolerance. Negative for increased urination.  Genitourinary:  Negative for difficulty urinating and painful urination.  Musculoskeletal:  Positive for joint pain, joint pain and morning stiffness. Negative for joint swelling, myalgias, muscle weakness, muscle tenderness and myalgias.  Skin:  Negative for color change, pallor, rash, hair loss, nodules/bumps, skin tightness, ulcers and sensitivity to sunlight.  Allergic/Immunologic: Negative for susceptible to infections.  Neurological:  Negative for dizziness, headaches and weakness.  Hematological:  Negative for bruising/bleeding tendency and swollen glands.  Psychiatric/Behavioral:  Positive for sleep disturbance. Negative for depressed mood. The patient is not nervous/anxious.    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 pylori infection    H/O: upper GI bleed    Hyperlipidemia    Mild acid reflux OCCASIONAL--  WATCHES DIET   Nocturia    Numbness s/p cerival fusion   right side pain and numbness    Osteopenia    Pulmonary nodule noted in 2010   right upper lobe -- stable (monitored by pcp)   Status post dilation of esophageal narrowing 2011   Urge incontinence of urine     Family History  Problem Relation Age of Onset   Hypertension Mother    Heart failure Mother    Colon polyps Mother    Other Father        had to have esogasus dilated   Heart disease Brother    Stroke Maternal Grandmother    Hypertension Brother    Heart disease Brother    Healthy Son    Esophageal cancer Neg Hx    Colon cancer Neg Hx    Rectal cancer Neg Hx    Stomach cancer Neg Hx    Past Surgical History:  Procedure Laterality Date   CATARACT EXTRACTION, BILATERAL  07/2017   with lens implants   CERVICAL DISKECTOMY AND FUSION  05-14-2002   C5 - 7   COLONOSCOPY     CYSTO WITH HYDRODISTENSION  07/19/2011   Procedure: CYSTOSCOPY/HYDRODISTENSION;  Surgeon: Malka So;  Location: [place];  Service: Urology;  Laterality: N/A;  CYSTOSCOPY WITH HYDORDISTENTION OF BLADDER AND INSTILLATION OF MARCAINE AND PYRIDIUM   cysto/ hod  03-10-2009   [facility]   CYSTOSCOPY N/A 05/01/2013   Procedure: CYSTOSCOPY;  Surgeon: Reece Packer, MD;  Location: [facility];  Service: Urology;  Laterality: N/A;   CYSTOSCOPY WITH INJECTION  06/17/2012   Procedure: CYSTOSCOPY WITH INJECTION;  Surgeon: Reece Packer, MD;  Location: [place];  Service: Urology;  Laterality: N/A;  BOTOX   CYSTOSCOPY WITH INJECTION N/A 12/08/2012   Procedure: CYSTOSCOPY WITH  BOTOX INJECTION;  Surgeon: Reece Packer, MD;  Location: [place];  Service: Urology;  Laterality: N/A;   laparoscopy ovarian cystectomy  2000 (APPROX)   left rotator cuff repair  OCT 2009   POSTERIOR FUSION CERVICAL SPINE  06-29-2009   C4 - 5   PUBOVAGINAL SLING N/A 05/01/2013   Procedure:  Lowella Dell;  Surgeon: Reece Packer, MD;  Location: [facility];  Service: Urology;  Laterality: N/A;   ROTATOR CUFF REPAIR  2001   RIGHT SHOULDER   TONSILLECTOMY  1981   VAGINAL HYSTERECTOMY  1992   Social History   Social History Narrative   Not on file   Immunization History  Administered Date(s) Administered   PFIZER(Purple Top)SARS-COV-2 Vaccination 11/27/2019, 12/18/2019, 06/24/2020, 01/12/2021   Tdap 02/01/2019     Objective: Vital Signs: BP 127/62 (BP Location: Left Arm, Patient Position: Sitting, Cuff Size: Normal)   Pulse 79   Resp 15   Ht 5\' 4"  (1.626 m)   Wt 131 lb (59.4 kg)  BMI 22.49 kg/m    Physical Exam Vitals and nursing note reviewed.  Constitutional:      Appearance: She is well-developed.  HENT:     Head: Normocephalic and atraumatic.  Eyes:     Conjunctiva/sclera: Conjunctivae normal.  Pulmonary:     Effort: Pulmonary effort is normal.  Abdominal:     Palpations: Abdomen is soft.  Musculoskeletal:     Cervical back: Normal range of motion.  Skin:    General: Skin is warm and dry.     Capillary Refill: Capillary refill takes less than 2 seconds.  Neurological:     Mental Status: She is alert and oriented to person, place, and time.  Psychiatric:        Behavior: Behavior normal.     Musculoskeletal Exam: C-spine limited ROM with lateral rotation.  Thoracic spine and lumbar spine good ROM.  Painful and limited internal rotation of the left shoulder.  Right shoulder has good ROM with no tenderness or discomfort.  Elbow joints, wrist joints, MCPs, PIPs, and DIP good ROM with no synovitis.  Complete fist formation  bilaterally.  PIP and DIP thickening consistent with osteoarthritis of both hands.  Hip joints have good ROM with some stiffness in the left hip.  Knee joints have good ROM with no warmth or effusion.  Ankle joints have good ROM with no tenderness or joint swelling.   CDAI Exam: CDAI Score: -- Patient Global: --; Provider Global: -- Swollen: --; Tender: -- Joint Exam 07/31/2021   No joint exam has been documented for this visit   There is currently no information documented on the homunculus. Go to the Rheumatology activity and complete the homunculus joint exam.  Investigation: No additional findings.  Imaging: No results found.  Recent Labs: Lab Results  Component Value Date   WBC 5.3 11/12/2017   HGB 13.6 11/12/2017   PLT 250 11/12/2017   NA 142 12/17/2017   K 4.7 12/17/2017   CL 107 12/17/2017   CO2 31 12/17/2017   GLUCOSE 89 12/17/2017   BUN 15 12/17/2017   CREATININE 0.77 12/17/2017   BILITOT 0.4 12/17/2017   ALKPHOS 65 11/05/2016   AST 25 12/17/2017   ALT 17 12/17/2017   PROT 6.5 12/17/2017   ALBUMIN 4.3 11/05/2016   CALCIUM 9.6 12/17/2017   GFRAA 98 12/17/2017    Speciality Comments: Dexa: 09/08/18 Osteopenia T-Score -2.2 BMD 0.800 Hold reclast  Procedures:  Large Joint Inj: L subacromial bursa on 07/31/2021 8:31 AM Indications: pain Details: 27 G 1.5 in needle, posterior approach  Arthrogram: No  Medications: 1 mL lidocaine 1 %; 40 mg triamcinolone acetonide 40 MG/ML Aspirate: 0 mL Outcome: tolerated well, no immediate complications Procedure, treatment alternatives, risks and benefits explained, specific risks discussed. Consent was given by the patient. Immediately prior to procedure a time out was called to verify the correct patient, procedure, equipment, support staff and site/side marked as required. Patient was prepped and draped in the usual sterile fashion.    Allergies: Cymbalta [duloxetine hcl] and Lyrica [pregabalin]   Assessment / Plan:      Visit Diagnoses: Primary osteoarthritis of both hands: She has PIP and DIP thickening consistent with osteoarthritis of both hands.  CMC joint prominence noted bilaterally.  Complete fist formation bilaterally.  Discussed the importance of joint protection and muscle strengthening.  She was encouraged to perform hand exercises on a daily basis.  She can use Voltaren gel topically as needed for pain relief.  She was advised to  notify us if she develops increased joint pain or joint swelling.  Primary osteoarthritis of both knees: She has good range of motion of both knee joints on examination today.  No warmth or effusion was noted.  She notices significant improvement in her knee joint pain and stiffness after undergoing Visco gel injections in May/June 2022.  She remains active exercising 4 to 5 days a week at the senior center.  She has been able to perform leg press and has been working on quadricep strengthening which is also been helping with her knee joint pain and stiffness.  She is not ready to proceed with Visco gel injections at this time but will notify us if her discomfort returns.  She plans on using Voltaren gel topically as needed for pain relief.  Age-related osteoporosis without current pathological fracture - DEXA 10/06/2020 T-score:-2.1, BMD 0.821, AP Spine. DEXA on 09/08/2018 AP spine BMD 0.800 with T score -2.2. Her last Reclast IV infusion was on April 2019.  She would like to proceed with the next Reclast infusion as previously discussed at her last office visit on 01/30/2021.  Indications, contraindications, potential side effects of Reclast were discussed.  All questions were addressed.  CBC, CMP, vitamin D will be updated today prior to her next infusion.  She will remain on a calcium and vitamin D supplement as previously recommended.  She has not had any recent falls or fractures.  Discussed the importance of resistive and strengthening exercises.  Her Reclast infusion orders will  be placed pending lab results today.  Her next DEXA will be due in January 2024.- Plan: CBC with Differential/Platelet, COMPLETE METABOLIC PANEL WITH GFR, VITAMIN D 25 Hydroxy (Vit-D Deficiency, Fractures)  Medication monitoring encounter -CBC, CMP, vitamin D will be checked today prior to scheduling her next Reclast infusion.  Plan: CBC with Differential/Platelet, COMPLETE METABOLIC PANEL WITH GFR  Vitamin D deficiency -She has a day has had a vitamin D supplement on a daily basis.  Vitamin D level be checked today.  Order was released.  Plan: VITAMIN D 25 Hydroxy (Vit-D Deficiency, Fractures)  S/P rotator cuff repair - Bilateral- Performed by Dr. Marlou Sa.  She experiences discomfort due to chronic left subacromial bursitis. She requested a repeat injection today as discussed below.  Procedure note completed above.  Aftercare discussed.  Discussed the importance of stretching and strengthening daily.   Chronic left shoulder pain - She presents today with acute on chronic pain in the left shoulder.  No recent injury or fall.  Previously had the left rotator cuff repaired by Dr. Marlou Sa.  She continues to have recurrent episodes of subacromial bursitis requiring cortisone injections on a regular basis.  She has been performing stretching and strengthening exercises daily with minimal improvement in her symptoms.  She has noticed increased nocturnal pain over the past several weeks.  On examination she has painful range of motion with internal rotation as well as crossover.  Tenderness over the subacromial bursa noted.  Her last left shoulder subacromial bursa injection was on 01/30/2021 provided temporary relief.  She requested a repeat injection today.  She tolerated the procedure well.  Procedure note was completed above.  Aftercare was discussed.  If her symptoms persist or worsen I would recommend following up with Dr. Marlou Sa for further evaluation and management.  Plan: Large Joint Inj: L subacromial  bursa  Trochanteric bursitis of both hips: Resolved.  She has been performing stretching exercises on a daily basis.  DDD (degenerative disc disease), cervical:  C-spine has limited range of motion with lateral rotation.  She performs neck exercises daily.  DDD (degenerative disc disease), thoracic: She is not experiencing discomfort in the thoracic spine at this time.  DDD (degenerative disc disease), lumbar - Epidural steroid injection performed by Dr. Ernestina Patches on 02/10/2020 and a radiofrequency ablation performed on 06/22/20.  Other medical conditions are listed as follows:   History of peptic ulcer disease  Interstitial cystitis  Dyslipidemia    Orders: Orders Placed This Encounter  Procedures   Large Joint Inj: L subacromial bursa   CBC with Differential/Platelet   COMPLETE METABOLIC PANEL WITH GFR   VITAMIN D 25 Hydroxy (Vit-D Deficiency, Fractures)   No orders of the defined types were placed in this encounter.     Follow-Up Instructions: Return in about 6 months (around 01/28/2022) for Osteoarthritis, Osteoporosis.   Ofilia Neas, PA-C  Note - This record has been created using Dragon software.  Chart creation errors have been sought, but may not always  have been located. Such creation errors do not reflect on  the standard of medical care.

## 2021-07-31 ENCOUNTER — Other Ambulatory Visit: Payer: Self-pay

## 2021-07-31 ENCOUNTER — Telehealth: Payer: Self-pay | Admitting: Pharmacist

## 2021-07-31 ENCOUNTER — Encounter: Payer: Self-pay | Admitting: Physician Assistant

## 2021-07-31 ENCOUNTER — Ambulatory Visit: Payer: Medicare Other | Admitting: Physician Assistant

## 2021-07-31 VITALS — BP 127/62 | HR 79 | Resp 15 | Ht 64.0 in | Wt 131.0 lb

## 2021-07-31 DIAGNOSIS — M19041 Primary osteoarthritis, right hand: Secondary | ICD-10-CM

## 2021-07-31 DIAGNOSIS — M170 Bilateral primary osteoarthritis of knee: Secondary | ICD-10-CM

## 2021-07-31 DIAGNOSIS — M5136 Other intervertebral disc degeneration, lumbar region: Secondary | ICD-10-CM

## 2021-07-31 DIAGNOSIS — E785 Hyperlipidemia, unspecified: Secondary | ICD-10-CM

## 2021-07-31 DIAGNOSIS — G8929 Other chronic pain: Secondary | ICD-10-CM

## 2021-07-31 DIAGNOSIS — Z9889 Other specified postprocedural states: Secondary | ICD-10-CM | POA: Diagnosis not present

## 2021-07-31 DIAGNOSIS — M7062 Trochanteric bursitis, left hip: Secondary | ICD-10-CM

## 2021-07-31 DIAGNOSIS — M5030 Other cervical disc degeneration, unspecified cervical region: Secondary | ICD-10-CM

## 2021-07-31 DIAGNOSIS — M810 Age-related osteoporosis without current pathological fracture: Secondary | ICD-10-CM | POA: Diagnosis not present

## 2021-07-31 DIAGNOSIS — M19042 Primary osteoarthritis, left hand: Secondary | ICD-10-CM

## 2021-07-31 DIAGNOSIS — M25512 Pain in left shoulder: Secondary | ICD-10-CM

## 2021-07-31 DIAGNOSIS — M7061 Trochanteric bursitis, right hip: Secondary | ICD-10-CM

## 2021-07-31 DIAGNOSIS — Z5181 Encounter for therapeutic drug level monitoring: Secondary | ICD-10-CM | POA: Diagnosis not present

## 2021-07-31 DIAGNOSIS — E559 Vitamin D deficiency, unspecified: Secondary | ICD-10-CM

## 2021-07-31 DIAGNOSIS — N3010 Interstitial cystitis (chronic) without hematuria: Secondary | ICD-10-CM

## 2021-07-31 DIAGNOSIS — M5134 Other intervertebral disc degeneration, thoracic region: Secondary | ICD-10-CM

## 2021-07-31 DIAGNOSIS — Z8711 Personal history of peptic ulcer disease: Secondary | ICD-10-CM

## 2021-07-31 MED ORDER — LIDOCAINE HCL 1 % IJ SOLN
1.0000 mL | INTRAMUSCULAR | Status: AC | PRN
  Administered 2021-07-31: 1 mL

## 2021-07-31 MED ORDER — TRIAMCINOLONE ACETONIDE 40 MG/ML IJ SUSP
40.0000 mg | INTRAMUSCULAR | Status: AC | PRN
  Administered 2021-07-31: 40 mg via INTRA_ARTICULAR

## 2021-07-31 NOTE — Telephone Encounter (Signed)
Patient had OV w Hazel Sams, PA-C today and decision made to restart Reclast after drug holiday. Patient updated DEXA on 10/06/20. Last Reclast was 12/06/17  Please start Reclast BIV through medical benefit.  Reclast - [id number]  Dose: 5 mg IV every 12 months  Dx: age-related osteoporosis without current pathological fracture (M81.0)  Previously tried therapies: Reclast x 2 years   Knox Saliva, PharmD, MPH, BCPS Clinical Pharmacist (Rheumatology and Pulmonology)

## 2021-08-01 ENCOUNTER — Other Ambulatory Visit: Payer: Self-pay | Admitting: Pharmacist

## 2021-08-01 DIAGNOSIS — M810 Age-related osteoporosis without current pathological fracture: Secondary | ICD-10-CM

## 2021-08-01 LAB — COMPLETE METABOLIC PANEL WITH GFR
AG Ratio: 2.1 (calc) (ref 1.0–2.5)
ALT: 28 U/L (ref 6–29)
AST: 30 U/L (ref 10–35)
Albumin: 5 g/dL (ref 3.6–5.1)
Alkaline phosphatase (APISO): 66 U/L (ref 37–153)
BUN: 16 mg/dL (ref 7–25)
CO2: 30 mmol/L (ref 20–32)
Calcium: 10.1 mg/dL (ref 8.6–10.4)
Chloride: 104 mmol/L (ref 98–110)
Creat: 0.81 mg/dL (ref 0.50–1.05)
Globulin: 2.4 g/dL (calc) (ref 1.9–3.7)
Glucose, Bld: 91 mg/dL (ref 65–99)
Potassium: 5.2 mmol/L (ref 3.5–5.3)
Sodium: 142 mmol/L (ref 135–146)
Total Bilirubin: 0.6 mg/dL (ref 0.2–1.2)
Total Protein: 7.4 g/dL (ref 6.1–8.1)
eGFR: 82 mL/min/{1.73_m2} (ref >=60)

## 2021-08-01 LAB — CBC WITH DIFFERENTIAL/PLATELET
Absolute Monocytes: 319 cells/uL (ref 200–950)
Basophils Absolute: 11 cells/uL (ref 0–200)
Basophils Relative: 0.2 %
Eosinophils Absolute: 22 cells/uL (ref 15–500)
Eosinophils Relative: 0.4 %
HCT: 46.5 % — ABNORMAL HIGH (ref 35.0–45.0)
Hemoglobin: 15 g/dL (ref 11.7–15.5)
Lymphs Abs: 2134 cells/uL (ref 850–3900)
MCH: 28.1 pg (ref 27.0–33.0)
MCHC: 32.3 g/dL (ref 32.0–36.0)
MCV: 87.2 fL (ref 80.0–100.0)
MPV: 10.1 fL (ref 7.5–12.5)
Monocytes Relative: 5.8 %
Neutro Abs: 3014 cells/uL (ref 1500–7800)
Neutrophils Relative %: 54.8 %
Platelets: 248 10*3/uL (ref 140–400)
RBC: 5.33 10*6/uL — ABNORMAL HIGH (ref 3.80–5.10)
RDW: 12.1 % (ref 11.0–15.0)
Total Lymphocyte: 38.8 %
WBC: 5.5 10*3/uL (ref 3.8–10.8)

## 2021-08-01 LAB — VITAMIN D 25 HYDROXY (VIT D DEFICIENCY, FRACTURES): Vit D, 25-Hydroxy: 42 ng/mL (ref 30–100)

## 2021-08-01 NOTE — Progress Notes (Signed)
RBC count and hct are borderline elevated.  Rest of CBC WNL.  CMP WNL.  Vitamin D WNL. Ok to proceed with IV reclast infusion.

## 2021-08-01 NOTE — Progress Notes (Signed)
Next infusion not yet scheduled for Reclast IV and due for updated orders. Diagnosis: age-related osteoporosis  Dose: 5mg  IV every 12 months (last doses were 11/09/2016 and 12/06/2017)  Last Clinic Visit: 07/31/21 Next Clinic Visit: 01/29/22  Last infusion: 12/06/2017 - drug holiday  Labs: CBC, CMP, and Vitamin D drawn on 07/31/21 wnl to proceed with Reclast  Orders placed for Reclast IV x 1 dose along with premedication of acetaminophen and diphenhydramine to be administered 30 minutes before medication infusion.  Called patient and provided with phone number for: [facility] [phone]) [place]  Will follow-up to ensured scheduled and completed  Knox Saliva, PharmD, MPH, BCPS Clinical Pharmacist (Rheumatology and Pulmonology)

## 2021-08-01 NOTE — Telephone Encounter (Addendum)
Called [employer] for benefits and and eligibility for Reclast.  Plan was active 09/10/20 - member must use in-network provider. [place] is in network.  Patient has $3400 OOP maximum on expenses. Patient has met $393.26 as of 08/01/21. No deductible. Patient has 20% coinsurance for Part B covered drugs.  Per rep, [id number] - does not require prior authorization.  [facility] ([facility] [id number]) is in-network  Reference #: Maryagnes Amos [id number]  Phone: [phone]  Discussed BIV results with patients and advised that order has been placed with [facility]. Provided her w phone number to schedule infusion  Knox Saliva, PharmD, MPH, BCPS Clinical Pharmacist (Rheumatology and Pulmonology)

## 2021-08-04 NOTE — Progress Notes (Signed)
Patient scheduled Reclast for 08/18/21 at [facility]. Will f/u to ensure completed  Knox Saliva, PharmD, MPH, BCPS Clinical Pharmacist (Rheumatology and Pulmonology)

## 2021-08-18 ENCOUNTER — Ambulatory Visit (HOSPITAL_COMMUNITY)
Admission: RE | Admit: 2021-08-18 | Discharge: 2021-08-18 | Disposition: A | Discharge status: Home / Self Care | Payer: Medicare Other | Source: Ambulatory Visit | Attending: Rheumatology | Admitting: Rheumatology

## 2021-08-18 ENCOUNTER — Other Ambulatory Visit: Payer: Self-pay

## 2021-08-18 DIAGNOSIS — M810 Age-related osteoporosis without current pathological fracture: Secondary | ICD-10-CM | POA: Diagnosis not present

## 2021-08-18 MED ORDER — ZOLEDRONIC ACID 5 MG/100ML IV SOLN: 5.0000 mg | Freq: Once | INTRAVENOUS | Status: DC

## 2021-08-18 MED ORDER — ACETAMINOPHEN 325 MG PO TABS: 650.0000 mg | ORAL_TABLET | Freq: Once | ORAL | Status: DC

## 2021-08-18 MED ORDER — ZOLEDRONIC ACID 5 MG/100ML IV SOLN
INTRAVENOUS | Status: AC
  Administered 2021-08-18: 5 mg
  Filled 2021-08-18: qty 100

## 2021-08-18 MED ORDER — DIPHENHYDRAMINE HCL 25 MG PO CAPS: 25.0000 mg | ORAL_CAPSULE | Freq: Once | ORAL | Status: DC

## 2021-09-19 ENCOUNTER — Telehealth: Payer: Self-pay | Admitting: *Deleted

## 2021-09-19 NOTE — Telephone Encounter (Signed)
Spoke with patient to advised we received a fax from [facility] about her taking several medications, Xanax, Hydrocodone, tramadol and Tizanidine. Patient advised per Hazel Sams, PA-C, make sure to avoid concurrent use of xanax, tizanidine and tramadol.

## 2021-09-21 DIAGNOSIS — Z1231 Encounter for screening mammogram for malignant neoplasm of breast: Secondary | ICD-10-CM | POA: Diagnosis not present

## 2021-10-17 DIAGNOSIS — Z85828 Personal history of other malignant neoplasm of skin: Secondary | ICD-10-CM | POA: Diagnosis not present

## 2021-10-17 DIAGNOSIS — D225 Melanocytic nevi of trunk: Secondary | ICD-10-CM | POA: Diagnosis not present

## 2021-10-17 DIAGNOSIS — D2230 Melanocytic nevi of unspecified part of face: Secondary | ICD-10-CM | POA: Diagnosis not present

## 2021-10-17 DIAGNOSIS — D2271 Melanocytic nevi of right lower limb, including hip: Secondary | ICD-10-CM | POA: Diagnosis not present

## 2021-10-18 ENCOUNTER — Telehealth: Payer: Self-pay | Admitting: Orthopedic Surgery

## 2021-10-18 NOTE — Telephone Encounter (Signed)
Pt calling to see if she can be fit into the sch with her mother on Friday for Dr. Marlou Sa. Pt states she has hurt her shoulder and since she is bringing her mother Friday to see Dr. Marlou Sa as well wanted to see if it was possible. Dr. Marlou Sa does not have any openings for Friday as of right now. The best call back number to let the pt know is [phone].

## 2021-10-19 NOTE — Telephone Encounter (Signed)
Scheduled

## 2021-10-20 ENCOUNTER — Ambulatory Visit: Payer: Self-pay

## 2021-10-20 ENCOUNTER — Encounter: Payer: Self-pay | Admitting: Orthopedic Surgery

## 2021-10-20 ENCOUNTER — Other Ambulatory Visit: Payer: Self-pay

## 2021-10-20 ENCOUNTER — Ambulatory Visit: Payer: Medicare Other | Admitting: Orthopedic Surgery

## 2021-10-20 DIAGNOSIS — M19011 Primary osteoarthritis, right shoulder: Secondary | ICD-10-CM

## 2021-10-20 DIAGNOSIS — M25511 Pain in right shoulder: Secondary | ICD-10-CM

## 2021-10-20 MED ORDER — LIDOCAINE HCL 1 % IJ SOLN
3.0000 mL | INTRAMUSCULAR | Status: AC | PRN
  Administered 2021-10-20: 3 mL

## 2021-10-20 MED ORDER — METAXALONE 800 MG PO TABS: ORAL_TABLET | ORAL | 0 refills | Status: DC

## 2021-10-20 MED ORDER — METHYLPREDNISOLONE ACETATE 40 MG/ML IJ SUSP
13.3300 mg | INTRAMUSCULAR | Status: AC | PRN
  Administered 2021-10-20: 13.33 mg via INTRA_ARTICULAR

## 2021-10-20 MED ORDER — BUPIVACAINE HCL 0.25 % IJ SOLN
0.6600 mL | INTRAMUSCULAR | Status: AC | PRN
  Administered 2021-10-20: .66 mL via INTRA_ARTICULAR

## 2021-10-20 NOTE — Progress Notes (Signed)
Office Visit Note   Patient: Brenda Gray           Date of Birth: 05-11-1961           MRN: [id number] Visit Date: 10/20/2021 Requested by: Donnajean Lopes, MD [address] [place],  [place] [address] PCP: Donnajean Lopes, MD  Subjective: Chief Complaint  Patient presents with   Right Shoulder - Pain    HPI: Killian is a 61 year old female with right shoulder pain.  She ran into a door frame several months ago.  Now she has anterior pain localizing to the [facility] joint.  Denies any weakness in the shoulder.  Painful reaching in front of her.  She is right-hand dominant.  She uses a muscle relaxer at times.  She likes to bowl.              ROS: All systems reviewed are negative as they relate to the chief complaint within the history of present illness.  Patient denies  fevers or chills.   Assessment & Plan: Visit Diagnoses:  1. Right shoulder pain, unspecified chronicity   2. Arthritis of right acromioclavicular joint     Plan: Impression is right shoulder AC joint pain.  Plan is continue topical anti-inflammatories.  Ultrasound-guided AC joint injection performed today.  Skelaxin prescribed.  Follow-up as needed.  Could consider repeat injection in 6 weeks if symptoms improve but do not last.  Follow-Up Instructions: Return if symptoms worsen or fail to improve.   Orders:  Orders Placed This Encounter  Procedures   XR Shoulder Right   US Guided Needle Placement - No Linked Charges   Meds ordered this encounter  Medications   metaxalone (SKELAXIN) 800 MG tablet    Sig: 1 po q 8 hours prn    Dispense:  35 tablet    Refill:  0      Procedures: Medium Joint Inj: R acromioclavicular on 10/20/2021 3:34 PM Indications: diagnostic evaluation and pain Details: 25 G 1.5 in needle, ultrasound-guided superior approach Medications: 3 mL lidocaine 1 %; 0.66 mL bupivacaine 0.25 %; 13.33 mg methylPREDNISolone acetate 40 MG/ML Outcome: tolerated well, no immediate  complications Procedure, treatment alternatives, risks and benefits explained, specific risks discussed. Consent was given by the patient. Immediately prior to procedure a time out was called to verify the correct patient, procedure, equipment, support staff and site/side marked as required. Patient was prepped and draped in the usual sterile fashion.      Clinical Data: No additional findings.  Objective: Vital Signs: There were no vitals taken for this visit.  Physical Exam:   Constitutional: Patient appears well-developed HEENT:  Head: Normocephalic Eyes:EOM are normal Neck: Normal range of motion Cardiovascular: Normal rate Pulmonary/chest: Effort normal Neurologic: Patient is alert Skin: Skin is warm Psychiatric: Patient has normal mood and affect   Ortho Exam: Ortho exam demonstrates full active and passive range of motion of the right shoulder.  Range of motion is 65/100/175.  Rotator cuff strength intact infraspinatus supraspinatus and subscap muscle testing.  No masses lymphadenopathy or skin changes noted in that shoulder girdle region.  AC joint tenderness is present on the right not on the left.  There is pain with crossarm adduction.  Specialty Comments:  No specialty comments available.  Imaging: US Guided Needle Placement - No Linked Charges  Result Date: 10/20/2021 Ultrasound images demonstrate needle placement into the [facility] joint with extravasation of fluid and no complicating features  XR Shoulder Right  Result Date: 10/20/2021  AP axillary outlet radiographs right shoulder reviewed.  No acute fracture.  Moderate AC joint arthritis is present.  No glenohumeral joint arthritis.  Acromiohumeral distance is normal.    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 pylori infection    H/O: upper GI bleed    Hyperlipidemia    Mild acid reflux OCCASIONAL--  WATCHES DIET   Nocturia    Numbness s/p cerival fusion   right side pain and numbness    Osteopenia    Pulmonary nodule noted in 2010   right upper lobe -- stable (monitored by pcp)   Status post dilation of esophageal narrowing 2011   Urge incontinence of urine     Family History  Problem Relation Age of Onset   Hypertension Mother    Heart failure Mother    Colon polyps Mother    Other Father        had to have esogasus dilated   Heart disease Brother    Stroke Maternal Grandmother    Hypertension Brother    Heart disease Brother    Healthy Son    Esophageal cancer Neg Hx    Colon cancer Neg Hx    Rectal cancer Neg Hx    Stomach cancer Neg Hx     Past Surgical History:  Procedure Laterality Date   CATARACT EXTRACTION, BILATERAL  07/2017   with lens implants   CERVICAL DISKECTOMY AND FUSION  05-14-2002   C5 - 7   COLONOSCOPY     CYSTO WITH HYDRODISTENSION  07/19/2011   Procedure: CYSTOSCOPY/HYDRODISTENSION;  Surgeon: Malka So;  Location: [place];  Service: Urology;   Laterality: N/A;  CYSTOSCOPY WITH HYDORDISTENTION OF BLADDER AND INSTILLATION OF MARCAINE AND PYRIDIUM   cysto/ hod  03-10-2009   [facility]   CYSTOSCOPY N/A 05/01/2013   Procedure: CYSTOSCOPY;  Surgeon: Reece Packer, MD;  Location[facility];  Service: Urology;  Laterality: N/A;   CYSTOSCOPY WITH INJECTION  06/17/2012   Procedure: CYSTOSCOPY WITH INJECTION;  Surgeon: Reece Packer, MD;  Location: [place];  Service: Urology;  Laterality: N/A;  BOTOX   CYSTOSCOPY WITH INJECTION N/A 12/08/2012   Procedure: CYSTOSCOPY WITH BOTOX INJECTION;  Surgeon: Reece Packer, MD;  Location: [place];  Service: Urology;  Laterality: N/A;   laparoscopy ovarian cystectomy  2000 (APPROX)   left rotator cuff repair  OCT 2009   POSTERIOR FUSION CERVICAL SPINE  06-29-2009   C4 -  5   PUBOVAGINAL SLING N/A 05/01/2013   Procedure:  Lowella Dell;  Surgeon: Reece Packer, MD;  Location: [facility];  Service: Urology;  Laterality: N/A;   ROTATOR CUFF REPAIR  2001   RIGHT SHOULDER   TONSILLECTOMY  1981   VAGINAL HYSTERECTOMY  1992   Social History   Occupational History   Occupation: retired  Tobacco Use   Smoking status: Former    Packs/day: 0.10    Years: 20.00    Pack years: 2.00    Types: Cigarettes    Quit date: 07/16/2006    Years since quitting: 15.2   Smokeless tobacco: Never  Vaping Use   Vaping Use: Never used  Substance and Sexual Activity   Alcohol use: Yes    Comment: rare   Drug use: No   Sexual activity: Not on file

## 2021-10-21 ENCOUNTER — Encounter: Payer: Self-pay | Admitting: Orthopedic Surgery

## 2021-10-23 MED ORDER — METHOCARBAMOL 500 MG PO TABS: 500.0000 mg | ORAL_TABLET | Freq: Three times a day (TID) | ORAL | 0 refills | Status: DC | PRN

## 2021-10-23 NOTE — Telephone Encounter (Signed)
thx

## 2021-11-29 ENCOUNTER — Other Ambulatory Visit: Payer: Self-pay | Admitting: Gastroenterology

## 2021-11-29 ENCOUNTER — Other Ambulatory Visit: Payer: Self-pay | Admitting: Physician Assistant

## 2021-11-29 NOTE — Telephone Encounter (Signed)
Next Visit: 01/29/2022 ? ?Last Visit: 07/31/2021 ? ?Last Fill: 06/16/2021 ? ?Current Dose per office note on 07/31/2021: not discussed ? ?Okay to refill Tizanidine?   ?

## 2021-12-20 ENCOUNTER — Telehealth: Payer: Self-pay | Admitting: *Deleted

## 2021-12-20 NOTE — Telephone Encounter (Signed)
Received fax from [facility]. Patient is receiving prescription for Methocarbamol, Xanax, Tramadol and Tizanidine. Patient advised to take the Tizanidine as needed. Patient expressed understanding.  ?

## 2021-12-25 ENCOUNTER — Ambulatory Visit: Payer: Medicare Other | Admitting: Orthopedic Surgery

## 2021-12-25 ENCOUNTER — Ambulatory Visit: Payer: Self-pay

## 2021-12-25 DIAGNOSIS — M19011 Primary osteoarthritis, right shoulder: Secondary | ICD-10-CM

## 2021-12-25 DIAGNOSIS — M25511 Pain in right shoulder: Secondary | ICD-10-CM

## 2021-12-27 ENCOUNTER — Encounter: Payer: Self-pay | Admitting: Orthopedic Surgery

## 2021-12-27 MED ORDER — BUPIVACAINE HCL 0.25 % IJ SOLN
0.6600 mL | INTRAMUSCULAR | Status: AC | PRN
  Administered 2021-12-25: .66 mL via INTRA_ARTICULAR

## 2021-12-27 MED ORDER — METHYLPREDNISOLONE ACETATE 40 MG/ML IJ SUSP
13.3300 mg | INTRAMUSCULAR | Status: AC | PRN
  Administered 2021-12-25: 13.33 mg via INTRA_ARTICULAR

## 2021-12-27 MED ORDER — LIDOCAINE HCL 1 % IJ SOLN
3.0000 mL | INTRAMUSCULAR | Status: AC | PRN
  Administered 2021-12-25: 3 mL

## 2021-12-27 NOTE — Progress Notes (Signed)
? ?Office Visit Note ?  ?Patient: Brenda Gray           ?Date of Birth: 04/21/1961           ?MRN: [id number] ?Visit Date: 12/25/2021 ?Requested by: Donnajean Lopes, MD ?[address] ?[place],  [place] [address] ?PCP: Donnajean Lopes, MD ? ?Subjective: ?Chief Complaint  ?Patient presents with  ? Right Shoulder - Pain  ? ? ?HPI: Dystany is a 61 year old patient with right shoulder pain.  Had [facility] joint injection 6 weeks ago and she did well with that.  She has now finished bowling season.  Prior injection 10/20/2021.  Had 0 pain for 3 days after that injection with slow recurrence of pain since that time.  Currently not quite as bad but the more use she does on the right arm the more pain she has.  Occasionally will wake her from sleep at night. ?             ?ROS: All systems reviewed are negative as they relate to the chief complaint within the history of present illness.  Patient denies  fevers or chills. ? ? ?Assessment & Plan: ?Visit Diagnoses:  ?1. Right shoulder pain, unspecified chronicity   ?2. Arthritis of right acromioclavicular joint   ? ? ?Plan: Impression is right shoulder pain.  Looks like it is localizing to the [facility] joint.  AC joint injection performed today under ultrasound guidance.  Not recommend any further injections in the [facility] joint this year.  If symptoms recur she may have to live with that for little longer or consider arthroscopic distal clavicle excision.  Follow-up with [place] as needed. ? ?Follow-Up Instructions: Return if symptoms worsen or fail to improve.  ? ?Orders:  ?Orders Placed This Encounter  ?Procedures  ? US Guided Needle Placement - No Linked Charges  ? ?No orders of the defined types were placed in this encounter. ? ? ? ? Procedures: ?Medium Joint Inj on 12/25/2021 7:29 AM ?Indications: pain and diagnostic evaluation ?Details: 27 G 1.5 in needle, ultrasound-guided superior approach ?Medications: 13.33 mg methylPREDNISolone acetate 40 MG/ML; 0.66 mL bupivacaine 0.25 %; 3 mL  lidocaine 1 % ?Outcome: tolerated well, no immediate complications ?Procedure, treatment alternatives, risks and benefits explained, specific risks discussed. Consent was given by the patient. Immediately prior to procedure a time out was called to verify the correct patient, procedure, equipment, support staff and site/side marked as required. Patient was prepped and draped in the usual sterile fashion.  ? ? ? ? ?Clinical Data: ?No additional findings. ? ?Objective: ?Vital Signs: There were no vitals taken for this visit. ? ?Physical Exam:  ? ?Constitutional: Patient appears well-developed ?HEENT:  ?Head: Normocephalic ?Eyes:EOM are normal ?Neck: Normal range of motion ?Cardiovascular: Normal rate ?Pulmonary/chest: Effort normal ?Neurologic: Patient is alert ?Skin: Skin is warm ?Psychiatric: Patient has normal mood and affect ? ? ?Ortho Exam: Ortho exam demonstrates full active and passive range of motion of the shoulder.  Does have asymmetric tenderness to palpation of the [facility] joint on the right compared to the left as well as pain with crossarm adduction on the right-hand side.  Not too much in the way of rotator cuff weakness grinding or crepitus with internal/external rotation at 90 degrees of abduction. ? ?Specialty Comments:  ?No specialty comments available. ? ?Imaging: ?No results found. ? ? ?PMFS History: ?Patient Active Problem List  ? Diagnosis Date Noted  ? Atypical chest pain 06/04/2017  ? S/P rotator cuff repair 11/04/2016  ?  Dyslipidemia 11/04/2016  ? History of peptic ulcer disease 11/04/2016  ? Interstitial cystitis 11/04/2016  ? DJD (degenerative joint disease), cervical 10/29/2016  ? Spondylosis of lumbar region without myelopathy or radiculopathy 10/29/2016  ? Primary osteoarthritis of both knees 10/29/2016  ? DDD (degenerative disc disease), thoracic 10/29/2016  ? Primary insomnia 10/29/2016  ? History of recurrent cystitis 10/29/2016  ? Primary osteoarthritis of both hands 10/29/2016  ? Morton  neuroma, right 10/15/2016  ? DYSPHAGIA UNSPECIFIED 07/31/2010  ? ABDOMINAL PAIN, EPIGASTRIC 07/31/2010  ? ?Past Medical History:  ?Diagnosis Date  ? Arthritis   ? Atypical chest pain 06/04/2017  ? Cataract   ? bilateral-removed 2 yrs ago 06/25/19  ? Chronic headache S/P CERVICAL FUSION'S  ? Cystitis, interstitial   ? frequency/urgency/nocturia  ? DDD (degenerative disc disease), cervical   ? Dry eyes   ? Frequency of urination   ? H. pylori infection   ? H/O: upper GI bleed   ? Hyperlipidemia   ? Mild acid reflux OCCASIONAL--  WATCHES DIET  ? Nocturia   ? Numbness s/p cerival fusion  ? right side pain and numbness   ? Osteopenia   ? Pulmonary nodule noted in 2010  ? right upper lobe -- stable (monitored by pcp)  ? Status post dilation of esophageal narrowing 2011  ? Urge incontinence of urine   ?  ?Family History  ?Problem Relation Age of Onset  ? Hypertension Mother   ? Heart failure Mother   ? Colon polyps Mother   ? Other Father   ?     had to have esogasus dilated  ? Heart disease Brother   ? Stroke Maternal Grandmother   ? Hypertension Brother   ? Heart disease Brother   ? Healthy Son   ? Esophageal cancer Neg Hx   ? Colon cancer Neg Hx   ? Rectal cancer Neg Hx   ? Stomach cancer Neg Hx   ?  ?Past Surgical History:  ?Procedure Laterality Date  ? CATARACT EXTRACTION, BILATERAL  07/2017  ? with lens implants  ? CERVICAL DISKECTOMY AND FUSION  05-14-2002  ? C5 - 7  ? COLONOSCOPY    ? CYSTO WITH HYDRODISTENSION  07/19/2011  ? Procedure: CYSTOSCOPY/HYDRODISTENSION;  Surgeon: Malka So;  Location: [place];  Service: Urology;  Laterality: N/A;  CYSTOSCOPY WITH HYDORDISTENTION OF BLADDER AND INSTILLATION OF MARCAINE AND PYRIDIUM  ? cysto/ hod  03-10-2009  ? [facility]  ? CYSTOSCOPY N/A 05/01/2013  ? Procedure: CYSTOSCOPY;  Surgeon: Reece Packer, MD;  Location: [facility];  Service: Urology;  Laterality: N/A;  ? CYSTOSCOPY WITH INJECTION  06/17/2012  ? Procedure: CYSTOSCOPY WITH  INJECTION;  Surgeon: Reece Packer, MD;  Location: [facility];  Service: Urology;  Laterality: N/A;  BOTOX  ? CYSTOSCOPY WITH INJECTION N/A 12/08/2012  ? Procedure: CYSTOSCOPY WITH BOTOX INJECTION;  Surgeon: Reece Packer, MD;  Location: [facility];  Service: Urology;  Laterality: N/A;  ? laparoscopy ovarian cystectomy  2000 (APPROX)  ? left rotator cuff repair  OCT 2009  ? POSTERIOR FUSION CERVICAL SPINE  06-29-2009  ? C4 - 5  ? PUBOVAGINAL SLING N/A 05/01/2013  ? Procedure:  Lowella Dell;  Surgeon: Reece Packer, MD;  Location: [facility];  Service: Urology;  Laterality: N/A;  ? [place] REPAIR  2001  ? RIGHT SHOULDER  ? TONSILLECTOMY  1981  ? VAGINAL HYSTERECTOMY  1992  ? ?Social History  ? ?  Occupational History  ? Occupation: retired  ?Tobacco Use  ? Smoking status: Former  ?  Packs/day: 0.10  ?  Years: 20.00  ?  Pack years: 2.00  ?  Types: Cigarettes  ?  Quit date: 07/16/2006  ?  Years since quitting: 15.4  ? Smokeless tobacco: Never  ?Vaping Use  ? Vaping Use: Never used  ?Substance and Sexual Activity  ? Alcohol use: Yes  ?  Comment: rare  ? Drug use: No  ? Sexual activity: Not on file  ? ? ? ? ? ?

## 2022-01-15 NOTE — Progress Notes (Signed)
Office Visit Note  Patient: Brenda Gray             Date of Birth: 08/26/1961           MRN: [id number]             PCP: Donnajean Lopes, MD Referring: Donnajean Lopes, MD Visit Date: 01/29/2022 Occupation: '@GUAROCC'$ @  Subjective:  Left shoulder pain   History of Present Illness: Brenda Gray is a 61 y.o. female with history of osteoarthritis and osteoporosis.  Patient presents today with increased discomfort in the left shoulder.  She has been experiencing nocturnal pain and uses Voltaren gel topically as needed.  She reports that her right shoulder joint pain has improved since having injection performed by Dr. Marlou Sa in April 2023.  She continues to have ongoing pain and stiffness in her neck and lower back.  She plans on following up with Dr. Ernestina Patches if she requires an ablation or injection in the future.  She states that overall her knee joints have been doing well.  Her last Visco gel injections were in May/June 2022.  She does not want to proceed with repeat Visco gel injections at this time.   She denies any recent falls or fractures.  Her last Reclast infusion was on 08/18/2021.  She continues to take a calcium and vitamin D supplement daily.   Activities of Daily Living:  Patient reports morning stiffness for 30-60 minutes.   Patient Reports nocturnal pain.  Difficulty dressing/grooming: Denies Difficulty climbing stairs: Denies Difficulty getting out of chair: Denies Difficulty using hands for taps, buttons, cutlery, and/or writing: Reports  Review of Systems  Constitutional:  Negative for fatigue.  HENT:  Negative for mouth sores, mouth dryness and nose dryness.   Eyes:  Negative for pain, itching and dryness.  Respiratory:  Negative for shortness of breath and difficulty breathing.   Cardiovascular:  Negative for chest pain and palpitations.  Gastrointestinal:  Negative for blood in stool, constipation and diarrhea.  Endocrine: Negative for increased  urination.  Genitourinary:  Negative for difficulty urinating.  Musculoskeletal:  Positive for joint pain, joint pain and morning stiffness. Negative for joint swelling, myalgias, muscle tenderness and myalgias.  Skin:  Negative for color change, rash and redness.  Allergic/Immunologic: Negative for susceptible to infections.  Neurological:  Positive for numbness. Negative for dizziness, headaches, memory loss and weakness.  Hematological:  Negative for bruising/bleeding tendency.  Psychiatric/Behavioral:  Negative for confusion.    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 pylori infection    H/O: upper GI bleed    Hyperlipidemia    Mild acid reflux OCCASIONAL--  WATCHES DIET   Nocturia    Numbness s/p cerival fusion   right side pain and numbness    Osteopenia    Pulmonary nodule noted in 2010   right upper lobe -- stable (monitored by pcp)   Status post dilation of esophageal narrowing 2011    Urge incontinence of urine     Family History  Problem Relation Age of Onset   Hypertension Mother    Heart failure Mother    Colon polyps Mother    Other Father        had to have esogasus dilated   Heart disease Brother    Stroke Maternal Grandmother    Hypertension Brother    Heart disease Brother    Healthy Son    Esophageal cancer Neg Hx    Colon cancer Neg Hx    Rectal cancer Neg Hx    Stomach cancer Neg Hx    Past Surgical History:  Procedure Laterality Date   CATARACT EXTRACTION, BILATERAL  07/2017   with lens implants   CERVICAL DISKECTOMY AND FUSION  05-14-2002   C5 - 7   COLONOSCOPY     CYSTO WITH HYDRODISTENSION  07/19/2011   Procedure: CYSTOSCOPY/HYDRODISTENSION;  Surgeon: Malka So;  Location: [place];  Service: Urology;  Laterality: N/A;  CYSTOSCOPY WITH HYDORDISTENTION OF BLADDER AND INSTILLATION OF MARCAINE AND PYRIDIUM   cysto/ hod  03-10-2009   [facility]   CYSTOSCOPY N/A 05/01/2013   Procedure: CYSTOSCOPY;  Surgeon: Reece Packer, MD;  Location: [facility];  Service: Urology;  Laterality: N/A;   CYSTOSCOPY WITH INJECTION  06/17/2012   Procedure: CYSTOSCOPY WITH INJECTION;  Surgeon: Reece Packer, MD;  Location: [place];  Service: Urology;  Laterality: N/A;  BOTOX   CYSTOSCOPY WITH INJECTION N/A 12/08/2012   Procedure: CYSTOSCOPY WITH BOTOX INJECTION;  Surgeon: Reece Packer, MD;  Location: [place];  Service: Urology;  Laterality: N/A;   laparoscopy ovarian cystectomy  2000 (APPROX)   left rotator cuff repair  OCT 2009   POSTERIOR FUSION CERVICAL SPINE  06-29-2009   C4 - 5   PUBOVAGINAL SLING N/A 05/01/2013   Procedure:  Lowella Dell;  Surgeon: Reece Packer, MD;  Location: [facility];  Service: Urology;  Laterality: N/A;   ROTATOR CUFF REPAIR  2001   RIGHT SHOULDER   TONSILLECTOMY  1981   VAGINAL HYSTERECTOMY  1992   Social History   Social  History Narrative   Not on file   Immunization History  Administered Date(s) Administered   PFIZER(Purple Top)SARS-COV-2 Vaccination 11/27/2019, 12/18/2019, 06/24/2020, 01/12/2021   Tdap 02/01/2019     Objective: Vital Signs: BP (!) 148/89 (BP Location: Right Arm, Patient Position: Sitting, Cuff Size: Normal)   Pulse 69   Ht 5' 4.5" (1.638 m)   Wt 133 lb 9.6 oz (60.6 kg)   BMI 22.58 kg/m    Physical Exam Vitals and nursing note reviewed.  Constitutional:      Appearance: She is well-developed.  HENT:     Head: Normocephalic and atraumatic.  Eyes:     Conjunctiva/sclera: Conjunctivae normal.  Cardiovascular:     Rate and Rhythm: Normal rate and regular rhythm.     Heart sounds: Normal heart sounds.  Pulmonary:     Effort: Pulmonary  effort is normal.     Breath sounds: Normal breath sounds.  Abdominal:     General: Bowel sounds are normal.     Palpations: Abdomen is soft.  Musculoskeletal:     Cervical back: Normal range of motion.  Skin:    General: Skin is warm and dry.     Capillary Refill: Capillary refill takes less than 2 seconds.  Neurological:     Mental Status: She is alert and oriented to person, place, and time.  Psychiatric:        Behavior: Behavior normal.     Musculoskeletal Exam: C-spine is limited range of motion especially with lateral rotation and extension.  Shoulder joints have good range of motion with some discomfort with passive lower and internal rotation of the left shoulder.  Tenderness over the left subacromial bursa and AC joint.  Elbow joints, wrist joints, MCPs, PIPs, DIPs have good range of motion with no synovitis.  PIP and DIP thickening consistent with osteoarthritis of both hands.  [place] joint prominence bilaterally.  Hip joints have good range of motion with no groin pain.  Tenderness palpation over bilateral trochanteric bursa.  Knee joints have good range of motion with no warmth or effusion.  Ankle joints have good range of motion with  no joint tenderness or synovitis.  CDAI Exam: CDAI Score: -- Patient Global: --; Provider Global: -- Swollen: --; Tender: -- Joint Exam 01/29/2022   No joint exam has been documented for this visit   There is currently no information documented on the homunculus. Go to the Rheumatology activity and complete the homunculus joint exam.  Investigation: No additional findings.  Imaging: No results found.  Recent Labs: Lab Results  Component Value Date   WBC 5.5 07/31/2021   HGB 15.0 07/31/2021   PLT 248 07/31/2021   NA 142 07/31/2021   K 5.2 07/31/2021   CL 104 07/31/2021   CO2 30 07/31/2021   GLUCOSE 91 07/31/2021   BUN 16 07/31/2021   CREATININE 0.81 07/31/2021   BILITOT 0.6 07/31/2021   ALKPHOS 65 11/05/2016   AST 30 07/31/2021   ALT 28 07/31/2021   PROT 7.4 07/31/2021   ALBUMIN 4.3 11/05/2016   CALCIUM 10.1 07/31/2021   GFRAA 98 12/17/2017    Speciality Comments: Dexa: 09/08/18 Osteopenia T-Score -2.2 BMD 0.800 Hold reclast  Procedures:  Large Joint Inj: L subacromial bursa on 01/29/2022 8:24 AM Indications: pain Details: 27 G 1.5 in needle, superolateral approach  Arthrogram: No  Medications: 1 mL lidocaine 1 %; 40 mg triamcinolone acetonide 40 MG/ML Aspirate: 0 mL Outcome: tolerated well, no immediate complications Procedure, treatment alternatives, risks and benefits explained, specific risks discussed. Consent was given by the patient. Immediately prior to procedure a time out was called to verify the correct patient, procedure, equipment, support staff and site/side marked as required. Patient was prepped and draped in the usual sterile fashion.    Allergies: Cymbalta [duloxetine hcl] and Lyrica [pregabalin]    Assessment / Plan:     Visit Diagnoses: Primary osteoarthritis of both hands: She has PIP and DIP thickening consistent with osteoarthritis of both hands.  CMC joint prominence noted bilaterally.  No inflammation was noted. Discussed the  importance of joint protection and muscle strengthening.  Primary osteoarthritis of both knees: She has good range of motion of both knee joints on examination today.  No warmth or effusion was noted.  She continues to exercise on a daily basis.  She had Visco gel injections in both  knees May/June 2022 which provided significant relief.  She is not ready to proceed with repeat Visco gel injections at this time.  She was advised to notify us if she develops increased joint pain or joint stiffness.  Age-related osteoporosis without current pathological fracture - DEXA 10/06/2020 T-score:-2.1, BMD 0.821, AP Spine.   Previous IV Reclast infusions 2018 and 2019.  Her most recent IV Reclast infusion was on 08/18/2021.  She continues to take calcium and vitamin D daily.  She has not had any falls or fractures. Discussed that I would recommend having updated bone density prior to scheduling her next Reclast infusion to determine if any medication changes need to be made.  She was in agreement.  We will further discuss at her follow-up visit.  Vitamin D deficiency: She is taking a calcium and vitamin D supplement daily.  S/P rotator cuff repair - Bilateral- Performed by Dr. Marlou Sa.  She had a right subacromial cortisone injection on 10/20/2021 followed by 12/25/2021 by Dr. Domenica Fail.  Her symptoms have improved.  She presents today with increased pain in the left shoulder especially in the left [facility] joint and subacromial bursa.  The left subacromial bursa was injected with cortisone today.  She tolerated the procedure well.  Procedure note was completed above.  Aftercare was discussed.  Chronic left shoulder pain - She presents today with increased discomfort in her left shoulder.  No recent injury or fall.  She is been experiencing some increased nocturnal pain.  She continues to perform range of motion exercises on a daily basis.  On examination she has painful range of motion with internal rotation and crossover.  Most of  her tenderness is over the left AC joint.  She had a left subacromial bursa injection on 07/31/2021 which provided significant relief.  She requested a repeat left subacromial cortisone injection today.  She tolerated the procedure well.  Procedure note was completed above.  Aftercare was discussed.  She was advised to notify us if her symptoms persist or worsen.  Plan: Large Joint Inj: L subacromial bursa  Trochanteric bursitis of both hips: She has tenderness palpation over bilateral trochanteric bursa.  Discussed importance of performing stretching exercises on a daily basis.  DDD (degenerative disc disease), cervical: C-spine has limited range of motion especially with rotation and extension.  DDD (degenerative disc disease), thoracic: No midline spinal tenderness at this time.  DDD (degenerative disc disease), lumbar - Epidural steroid injection performed by Dr. Ernestina Patches on 02/10/2020 and a radiofrequency ablation performed on 06/22/20.  She continues to experience intermittent discomfort in her lower back.  She plans on following up with Dr. Ernestina Patches if her symptoms persist or worsen.  She is not currently experiencing any symptoms of radiculopathy.  Other medical conditions are listed as follows:   Interstitial cystitis  History of peptic ulcer disease  Dyslipidemia  Orders: Orders Placed This Encounter  Procedures   Large Joint Inj: L subacromial bursa   No orders of the defined types were placed in this encounter.    Follow-Up Instructions: Return in about 6 months (around 08/01/2022) for Osteoarthritis, Osteoporosis.   Ofilia Neas, PA-C  Note - This record has been created using Dragon software.  Chart creation errors have been sought, but may not always  have been located. Such creation errors do not reflect on  the standard of medical care.

## 2022-01-29 ENCOUNTER — Ambulatory Visit: Payer: Medicare Other | Admitting: Physician Assistant

## 2022-01-29 ENCOUNTER — Encounter: Payer: Self-pay | Admitting: Physician Assistant

## 2022-01-29 VITALS — BP 148/89 | HR 69 | Ht 64.5 in | Wt 133.6 lb

## 2022-01-29 DIAGNOSIS — N3010 Interstitial cystitis (chronic) without hematuria: Secondary | ICD-10-CM

## 2022-01-29 DIAGNOSIS — E785 Hyperlipidemia, unspecified: Secondary | ICD-10-CM

## 2022-01-29 DIAGNOSIS — Z8711 Personal history of peptic ulcer disease: Secondary | ICD-10-CM

## 2022-01-29 DIAGNOSIS — M51369 Other intervertebral disc degeneration, lumbar region without mention of lumbar back pain or lower extremity pain: Secondary | ICD-10-CM

## 2022-01-29 DIAGNOSIS — E559 Vitamin D deficiency, unspecified: Secondary | ICD-10-CM | POA: Diagnosis not present

## 2022-01-29 DIAGNOSIS — M5030 Other cervical disc degeneration, unspecified cervical region: Secondary | ICD-10-CM

## 2022-01-29 DIAGNOSIS — M810 Age-related osteoporosis without current pathological fracture: Secondary | ICD-10-CM

## 2022-01-29 DIAGNOSIS — Z9889 Other specified postprocedural states: Secondary | ICD-10-CM

## 2022-01-29 DIAGNOSIS — M25512 Pain in left shoulder: Secondary | ICD-10-CM

## 2022-01-29 DIAGNOSIS — M170 Bilateral primary osteoarthritis of knee: Secondary | ICD-10-CM

## 2022-01-29 DIAGNOSIS — M19041 Primary osteoarthritis, right hand: Secondary | ICD-10-CM | POA: Diagnosis not present

## 2022-01-29 DIAGNOSIS — M5136 Other intervertebral disc degeneration, lumbar region: Secondary | ICD-10-CM

## 2022-01-29 DIAGNOSIS — M5134 Other intervertebral disc degeneration, thoracic region: Secondary | ICD-10-CM

## 2022-01-29 DIAGNOSIS — M19042 Primary osteoarthritis, left hand: Secondary | ICD-10-CM

## 2022-01-29 DIAGNOSIS — G8929 Other chronic pain: Secondary | ICD-10-CM | POA: Diagnosis not present

## 2022-01-29 DIAGNOSIS — M7062 Trochanteric bursitis, left hip: Secondary | ICD-10-CM

## 2022-01-29 DIAGNOSIS — M7061 Trochanteric bursitis, right hip: Secondary | ICD-10-CM

## 2022-01-29 MED ORDER — LIDOCAINE HCL 1 % IJ SOLN
1.0000 mL | INTRAMUSCULAR | Status: AC | PRN
  Administered 2022-01-29: 1 mL

## 2022-01-29 MED ORDER — TRIAMCINOLONE ACETONIDE 40 MG/ML IJ SUSP
40.0000 mg | INTRAMUSCULAR | Status: AC | PRN
  Administered 2022-01-29: 40 mg via INTRA_ARTICULAR

## 2022-03-30 ENCOUNTER — Other Ambulatory Visit: Payer: Self-pay | Admitting: Gastroenterology

## 2022-04-03 DIAGNOSIS — Z961 Presence of intraocular lens: Secondary | ICD-10-CM | POA: Diagnosis not present

## 2022-04-30 ENCOUNTER — Telehealth: Payer: Self-pay | Admitting: Physical Medicine and Rehabilitation

## 2022-04-30 NOTE — Telephone Encounter (Signed)
Patient called. She would like to see Dr. Ernestina Patches. Her call back number is [phone]

## 2022-05-10 ENCOUNTER — Ambulatory Visit: Payer: Medicare Other | Admitting: Physical Medicine and Rehabilitation

## 2022-05-23 ENCOUNTER — Ambulatory Visit (INDEPENDENT_AMBULATORY_CARE_PROVIDER_SITE_OTHER): Payer: Medicare Other | Admitting: Physical Medicine and Rehabilitation

## 2022-05-23 ENCOUNTER — Encounter: Payer: Self-pay | Admitting: Physical Medicine and Rehabilitation

## 2022-05-23 VITALS — BP 127/78 | HR 74 | Ht 64.5 in | Wt 127.0 lb

## 2022-05-23 DIAGNOSIS — M4726 Other spondylosis with radiculopathy, lumbar region: Secondary | ICD-10-CM | POA: Diagnosis not present

## 2022-05-23 DIAGNOSIS — M5416 Radiculopathy, lumbar region: Secondary | ICD-10-CM

## 2022-05-23 DIAGNOSIS — M47816 Spondylosis without myelopathy or radiculopathy, lumbar region: Secondary | ICD-10-CM

## 2022-05-23 NOTE — Progress Notes (Signed)
Low back pain x a few months. No known injury. Pain mostly on left side, however, it does bother her on the right. She has pain down both legs, left>right. Numbness all of the time down the left leg to foot, occasionally on right. She takes tramadol for pain as needed with relief. She also has tizanidine which helps, especially at night.  Numeric Pain Rating Scale and Functional Assessment Average Pain 6   In the last MONTH (on 0-10 scale) has pain interfered with the following?  1. General activity like being  able to carry out your everyday physical activities such as walking, climbing stairs, carrying groceries, or moving a chair?  Rating(6)   +Driver, -BT, -Dye Allergies.

## 2022-05-23 NOTE — Progress Notes (Signed)
Brenda Gray - 61 y.o. female MRN [id number]  Date of birth: 1961/06/12  Office Visit Note: Visit Date: 05/23/2022 PCP: Donnajean Lopes, MD Referred by: Donnajean Lopes, MD  Subjective: Chief Complaint  Patient presents with   Lower Back - Pain   HPI: Brenda Gray is a 61 y.o. female who comes in today for evaluation of chronic, worsening and severe bilateral lower back pain radiating down both posterior legs, left greater than right.  Pain ongoing for several years, has gradually increased over the last couple of months. She describes pain as sore and aching sensation, currently rates as 7 out of 10. Some relief of pain with home exercise regimen, rest and use of medications. She does take Tramadol as needed for moderate/severe pain that is prescribed by her primary care provider Dr. Leanna Battles. Patient has attended formal physical therapy in the past, reports no relief of pain with these treatments. Lumbar MRI imaging from 2011 exhibits mild multilevel spondylosis, there is shallow central disc protrusion at L5-S1, no high grade spinal canal stenosis noted. Patient underwent bilateral L5-S1 radiofrequency ablation in our office in 2021, reports this significantly helped axial back pain. She also has history of multiple lumbar epidural steroid injections performed in our office with good relief. Patient states her most severe pain at this time is to bilateral legs, reports pain is causing difficulty sleeping and performing daily tasks. Patient denies focal weakness, numbness and tingling. Patient denies recent trauma or falls.    Review of Systems  Musculoskeletal:  Positive for back pain.  Neurological:  Negative for tingling, sensory change, focal weakness and weakness.  All other systems reviewed and are negative.  Otherwise per HPI.  Assessment & Plan: Visit Diagnoses:    ICD-10-CM   1. Lumbar radiculopathy  M54.16 MR LUMBAR SPINE WO CONTRAST    2. Other  spondylosis with radiculopathy, lumbar region  M47.26 MR LUMBAR SPINE WO CONTRAST    3. Facet arthropathy, lumbar  M47.816 MR LUMBAR SPINE WO CONTRAST       Plan: Findings:  Chronic, worsening and severe bilateral lower back pain radiating down both posterior legs, left greater than right.  Patient continues to have severe pain despite good conservative therapy such as formal physical therapy, home exercise regimen, rest and use of medications.  Patient's clinical presentation and exam are complex, her symptoms do not directly correlate with lumbar MRI imaging from 2011.  Significant and sustained relief of pain with previous lumbar epidural steroid injections.  Next step is to obtain new lumbar MRI imaging to assess for any new findings that could be causing her pain.  We will have patient follow-up after lumbar MRI imaging is obtained for review and to discuss treatment options.  I did discuss the possibility of repeating bilateral S1 transforaminal epidural steroid injection under fluoroscopic guidance as her symptoms do seem to follow more of an S1 dermatomal pattern.  Patient is agreeable with plan and has no questions at this time.  No red flag symptoms noted upon exam today.    Meds & Orders: No orders of the defined types were placed in this encounter.   Orders Placed This Encounter  Procedures   MR LUMBAR SPINE WO CONTRAST    Follow-up: Return for Follow up after lumbar MRI is obtained for review.   Procedures: No procedures performed      Clinical History: MRI LUMBAR SPINE WITHOUT CONTRAST     Technique:  Multiplanar and multiecho pulse sequences  of the lumbar  spine were obtained without intravenous contrast.     Comparison: None available.     Findings: The numbering convention used for this exam terms L5-S1  as the last full intervertebral disc space above the sacrum.  Alignment shows loss of the normal lumbar lordosis.  The paraspinal  soft tissues appear within normal  limits.  Marrow signal is within  normal limits.     T12-L1:  Mild disc desiccation with shallow bulging.  No  significant stenosis.  L1-L2:  Negative.  L2-L3:  Mild disc desiccation and shallow posterior disc bulge.  No  stenosis.  L3-L4:  Mild disc desiccation.  Shallow broad-based posterior disc  bulge.  Mild bilateral facet hypertrophy.  No significant stenosis.  L4-L5:  Relatively preserved disc signal and height with shallow  disc bulging.  Bilateral facet hypertrophy. There is crowding of  the lateral recesses without neural compression.  Bulging disc  extends into both neural foramina and the pedicles are congenitally  short with mild symmetric bilateral foraminal stenosis.  L5-S1:  Shallow central disc protrusion is present.  Central canal  and lateral recesses patent.  Foramina adequately patent.     IMPRESSION:  Mild multilevel lumbar spondylosis without discrete lateralizing  lesions to account for the patient's left lower extremity symptoms.  Degenerative changes are most pronounced at L4-L5.   Provider: Sammie Bench   She reports that she quit smoking about 15 years ago. Her smoking use included cigarettes. She has a 2.00 pack-year smoking history. She has never been exposed to tobacco smoke. She has never used smokeless tobacco. No results for input(s): "HGBA1C", "LABURIC" in the last 8760 hours.  Objective:  VS:  HT:5' 4.5" (163.8 cm)   WT:127 lb (57.6 kg)  BMI:21.47    BP:127/78  HR:74bpm  TEMP: ( )  RESP:  Physical Exam Vitals and nursing note reviewed.  HENT:     Head: Normocephalic and atraumatic.     Right Ear: External ear normal.     Left Ear: External ear normal.     Nose: Nose normal.     Mouth/Throat:     Mouth: Mucous membranes are moist.  Eyes:     Extraocular Movements: Extraocular movements intact.  Cardiovascular:     Rate and Rhythm: Normal rate.     Pulses: Normal pulses.  Pulmonary:     Effort: Pulmonary effort is normal.  Abdominal:      General: Abdomen is flat. There is no distension.  Musculoskeletal:        General: Tenderness present.     Cervical back: Normal range of motion.     Comments: Pt rises from seated position to standing without difficulty. Good lumbar range of motion. Strong distal strength without clonus, no pain upon palpation of greater trochanters. Sensation intact bilaterally. Dysesthesias noted to bilateral S1 dermatomes. Walks independently, gait steady.   Skin:    General: Skin is warm and dry.     Capillary Refill: Capillary refill takes less than 2 seconds.  Neurological:     General: No focal deficit present.     Mental Status: She is alert and oriented to person, place, and time.  Psychiatric:        Mood and Affect: Mood normal.        Behavior: Behavior normal.     Ortho Exam  Imaging: No results found.  Past Medical/Family/Surgical/Social History: Medications & Allergies reviewed per EMR, new medications updated. 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 pylori infection    H/O: upper GI bleed    Hyperlipidemia    Mild acid reflux OCCASIONAL--  WATCHES DIET   Nocturia    Numbness s/p cerival fusion   right side pain and numbness    Osteopenia    Pulmonary nodule noted in 2010   right upper lobe -- stable (monitored by pcp)   Status post dilation of esophageal narrowing 2011   Urge incontinence of urine    Family History  Problem Relation Age of Onset   Hypertension Mother    Heart failure Mother    Colon polyps Mother    Other Father        had to have esogasus dilated   Heart disease Brother    Stroke Maternal Grandmother    Hypertension Brother    Heart disease Brother    Healthy Son    Esophageal cancer Neg Hx    Colon cancer Neg Hx    Rectal cancer Neg Hx    Stomach cancer Neg Hx    Past Surgical History:  Procedure Laterality Date   CATARACT EXTRACTION, BILATERAL  07/2017   with lens implants   CERVICAL DISKECTOMY AND FUSION  05-14-2002   C5 - 7   COLONOSCOPY     CYSTO WITH HYDRODISTENSION  07/19/2011   Procedure: CYSTOSCOPY/HYDRODISTENSION;  Surgeon: Malka So;  Location: [place];  Service: Urology;  Laterality: N/A;  CYSTOSCOPY WITH HYDORDISTENTION OF BLADDER AND INSTILLATION OF MARCAINE AND PYRIDIUM   cysto/ hod  03-10-2009   [facility]   CYSTOSCOPY N/A 05/01/2013   Procedure: CYSTOSCOPY;  Surgeon: Reece Packer, MD;  Location: [facility];  Service: Urology;  Laterality: N/A;   CYSTOSCOPY WITH INJECTION  06/17/2012   Procedure: CYSTOSCOPY WITH INJECTION;  Surgeon: Reece Packer, MD;  Location: [place];  Service: Urology;  Laterality: N/A;  BOTOX   CYSTOSCOPY WITH INJECTION N/A 12/08/2012   Procedure: CYSTOSCOPY WITH BOTOX INJECTION;  Surgeon: Reece Packer, MD;  Location: [place];  Service: Urology;  Laterality: N/A;   laparoscopy ovarian cystectomy  2000 (APPROX)   left  rotator cuff repair  OCT 2009   POSTERIOR FUSION CERVICAL SPINE  06-29-2009   C4 - 5   PUBOVAGINAL SLING N/A 05/01/2013   Procedure:  Lowella Dell;  Surgeon: Reece Packer, MD;  Location: [facility];  Service: Urology;  Laterality: N/A;   ROTATOR CUFF REPAIR  2001   RIGHT SHOULDER  TONSILLECTOMY  1981   VAGINAL HYSTERECTOMY  1992   Social History   Occupational History   Occupation: retired  Tobacco Use   Smoking status: Former    Packs/day: 0.10    Years: 20.00    Total pack years: 2.00    Types: Cigarettes    Quit date: 07/16/2006    Years since quitting: 15.8    Passive exposure: Never   Smokeless tobacco: Never  Vaping Use   Vaping Use: Never used  Substance and Sexual Activity   Alcohol use: Yes    Comment: rare   Drug use: No   Sexual activity: Not on file

## 2022-05-29 ENCOUNTER — Other Ambulatory Visit: Payer: Self-pay | Admitting: Rheumatology

## 2022-05-30 NOTE — Telephone Encounter (Signed)
Next Visit: 07/27/2022  Last Visit: 01/29/2022  Last Fill: 11/29/2021  Dx: Chronic left shoulder pain   Current Dose per office note on 01/29/2022: not discussed   Okay to refill Tizanidine?

## 2022-06-01 ENCOUNTER — Ambulatory Visit
Admission: RE | Admit: 2022-06-01 | Discharge: 2022-06-01 | Disposition: A | Discharge status: Home / Self Care | Payer: Medicare Other | Source: Ambulatory Visit | Attending: Physical Medicine and Rehabilitation | Admitting: Physical Medicine and Rehabilitation

## 2022-06-01 DIAGNOSIS — R200 Anesthesia of skin: Secondary | ICD-10-CM | POA: Diagnosis not present

## 2022-06-01 DIAGNOSIS — M47816 Spondylosis without myelopathy or radiculopathy, lumbar region: Secondary | ICD-10-CM | POA: Diagnosis not present

## 2022-06-01 DIAGNOSIS — M545 Low back pain, unspecified: Secondary | ICD-10-CM | POA: Diagnosis not present

## 2022-06-01 DIAGNOSIS — M4126 Other idiopathic scoliosis, lumbar region: Secondary | ICD-10-CM | POA: Diagnosis not present

## 2022-06-05 ENCOUNTER — Encounter: Payer: Self-pay | Admitting: Physical Medicine and Rehabilitation

## 2022-06-05 ENCOUNTER — Ambulatory Visit (INDEPENDENT_AMBULATORY_CARE_PROVIDER_SITE_OTHER): Payer: Medicare Other | Admitting: Physical Medicine and Rehabilitation

## 2022-06-05 DIAGNOSIS — M5442 Lumbago with sciatica, left side: Secondary | ICD-10-CM | POA: Diagnosis not present

## 2022-06-05 DIAGNOSIS — M5416 Radiculopathy, lumbar region: Secondary | ICD-10-CM

## 2022-06-05 DIAGNOSIS — M5441 Lumbago with sciatica, right side: Secondary | ICD-10-CM

## 2022-06-05 DIAGNOSIS — M47816 Spondylosis without myelopathy or radiculopathy, lumbar region: Secondary | ICD-10-CM

## 2022-06-05 DIAGNOSIS — M4726 Other spondylosis with radiculopathy, lumbar region: Secondary | ICD-10-CM | POA: Diagnosis not present

## 2022-06-05 DIAGNOSIS — G8929 Other chronic pain: Secondary | ICD-10-CM

## 2022-06-05 NOTE — Progress Notes (Unsigned)
Brenda Gray - 61 y.o. female MRN [id number]  Date of birth: 06-30-1961  Office Visit Note: Visit Date: 06/05/2022 PCP: Brenda Lopes, MD Referred by: Brenda Lopes, MD  Subjective: Chief Complaint  Patient presents with   Lower Back - Follow-up    MRI review   HPI: Brenda Gray is a 61 y.o. female who comes in today for evaluation of chronic, worsening and severe bilateral lower back pain radiating down both posterior legs, left greater than right.  Pain ongoing for several years and is exacerbated by movement, activity and laying down. She describes pain as sore and aching sensation, currently rates as 7 out of 10. Some relief of pain with home exercise regimen, rest and use of medications. She does take Tramadol as needed for moderate/severe pain that is prescribed by her primary care provider Dr. Leanna Gray. Patient has attended formal physical therapy in the past, reports no relief of pain with these treatments. Recent lumbar MRI imaging exhibits progressive lumbar disc and facet degeneration since 2011, there is mild-to-moderate lateral recess stenosis at L3-L4 and L4-L5, mild foraminal stenosis on the left at L3-L4 and on the right at L5-S1. No high grade spinal canal stenosis noted. Patient has history of bilateral S1 transforaminal epidural steroid injections performed in our office with significant relief of pain. Did undergo bilateral L5-S1 radiofrequency in our office in 2021 and provided sustained relief of axial back pain. Patient states her most severe pain at this time is to bilateral legs, reports pain is causing difficulty sleeping and performing daily tasks. Patient denies focal weakness, numbness and tingling. Patient denies recent trauma or falls.    Review of Systems  Musculoskeletal:  Positive for back pain.  Neurological:  Negative for tingling, sensory change, focal weakness and weakness.  All other systems reviewed and are negative.   Otherwise per HPI.  Assessment & Plan: Visit Diagnoses:    ICD-10-CM   1. Chronic bilateral low back pain with bilateral sciatica  M54.42 Ambulatory referral to Physical Medicine Rehab   M54.41    G89.29     2. Lumbar radiculopathy  M54.16 Ambulatory referral to Physical Medicine Rehab    3. Other spondylosis with radiculopathy, lumbar region  M47.26 Ambulatory referral to Physical Medicine Rehab    4. Facet arthropathy, lumbar  M47.816 Ambulatory referral to Physical Medicine Rehab       Plan: Findings:  Chronic, worsening and severe bilateral lower back pain radiating down both posterior legs, left greater than right.  Patient continues to have severe pain despite good conservative therapies such as formal physical therapy, home exercise regimen, rest and use of medications. I did review recent lumbar MRI with patient today using imaging and spine model. Patient's clinical presentation and exam are complex, her symptoms do not directly correlate with recent lumbar MRI imaging. Significant and sustained relief from previous lumbar epidural steroid injections. Next step is to repeat bilateral S1 transforaminal epidural steroid injection under fluoroscopic guidance. We can repeat this procedure infrequently as needed. If lower back pain persists post injection we did discuss possibility of repeating radiofrequency ablation. Patient denies focal weakness, numbness and tingling. Patient denies recent trauma or falls.     Meds & Orders: No orders of the defined types were placed in this encounter.   Orders Placed This Encounter  Procedures   Ambulatory referral to Physical Medicine Rehab    Follow-up: Return for Bilateral S1 transforaminal epidural steroid injection.   Procedures: No procedures performed  Clinical History: No specialty comments available.   She reports that she quit smoking about 15 years ago. Her smoking use included cigarettes. She has a 2.00 pack-year smoking  history. She has never been exposed to tobacco smoke. She has never used smokeless tobacco. No results for input(s): "HGBA1C", "LABURIC" in the last 8760 hours.  Objective:  VS:  HT:    WT:   BMI:     BP:   HR: bpm  TEMP: ( )  RESP:  Physical Exam Vitals and nursing note reviewed.  HENT:     Head: Normocephalic and atraumatic.     Right Ear: External ear normal.     Left Ear: External ear normal.     Nose: Nose normal.     Mouth/Throat:     Mouth: Mucous membranes are moist.  Eyes:     Extraocular Movements: Extraocular movements intact.  Cardiovascular:     Rate and Rhythm: Normal rate.     Pulses: Normal pulses.  Pulmonary:     Effort: Pulmonary effort is normal.  Abdominal:     General: Abdomen is flat. There is no distension.  Musculoskeletal:        General: Tenderness present.     Cervical back: Normal range of motion.     Comments: Pt rises from seated position to standing without difficulty. Good lumbar range of motion. Strong distal strength without clonus, no pain upon palpation of greater trochanters. Sensation intact bilaterally. Dysesthesias noted to bilateral S1 dermatomes. Walks independently, gait steady.    Skin:    General: Skin is warm and dry.     Capillary Refill: Capillary refill takes less than 2 seconds.  Neurological:     General: No focal deficit present.     Mental Status: She is alert and oriented to person, place, and time.  Psychiatric:        Mood and Affect: Mood normal.        Behavior: Behavior normal.     Ortho Exam  Imaging: No results found.  Past Medical/Family/Surgical/Social History: Medications & Allergies reviewed per EMR, new medications updated. Patient Active Problem List   Diagnosis Date Noted   Atypical chest pain 06/04/2017   S/P rotator cuff repair 11/04/2016   Dyslipidemia 11/04/2016   History of peptic ulcer disease 11/04/2016   Interstitial cystitis 11/04/2016   DJD (degenerative joint disease), cervical  10/29/2016   Spondylosis of lumbar region without myelopathy or radiculopathy 10/29/2016   Primary osteoarthritis of both knees 10/29/2016   DDD (degenerative disc disease), thoracic 10/29/2016   Primary insomnia 10/29/2016   History of recurrent cystitis 10/29/2016   Primary osteoarthritis of both hands 10/29/2016   Morton neuroma, right 10/15/2016   DYSPHAGIA UNSPECIFIED 07/31/2010   ABDOMINAL PAIN, EPIGASTRIC 07/31/2010   Past Medical History:  Diagnosis Date   Arthritis    Atypical chest pain 06/04/2017   Cataract    bilateral-removed 2 yrs ago 06/25/19   Chronic headache S/P CERVICAL FUSION'S   Cystitis, interstitial    frequency/urgency/nocturia   DDD (degenerative disc disease), cervical    Dry eyes    Frequency of urination    H. pylori infection    H/O: upper GI bleed    Hyperlipidemia    Mild acid reflux OCCASIONAL--  WATCHES DIET   Nocturia    Numbness s/p cerival fusion   right side pain and numbness    Osteopenia    Pulmonary nodule noted in 2010   right upper lobe -- stable (monitored  by pcp)   Status post dilation of esophageal narrowing 2011   Urge incontinence of urine    Family History  Problem Relation Age of Onset   Hypertension Mother    Heart failure Mother    Colon polyps Mother    Other Father        had to have esogasus dilated   Heart disease Brother    Stroke Maternal Grandmother    Hypertension Brother    Heart disease Brother    Healthy Son    Esophageal cancer Neg Hx    Colon cancer Neg Hx    Rectal cancer Neg Hx    Stomach cancer Neg Hx    Past Surgical History:  Procedure Laterality Date   CATARACT EXTRACTION, BILATERAL  07/2017   with lens implants   CERVICAL DISKECTOMY AND FUSION  05-14-2002   C5 - 7   COLONOSCOPY     CYSTO WITH HYDRODISTENSION  07/19/2011   Procedure: CYSTOSCOPY/HYDRODISTENSION;  Surgeon: Malka So;  Location: [place];  Service: Urology;  Laterality: N/A;  CYSTOSCOPY WITH  HYDORDISTENTION OF BLADDER AND INSTILLATION OF MARCAINE AND PYRIDIUM   cysto/ hod  03-10-2009   [facility]   CYSTOSCOPY N/A 05/01/2013   Procedure: CYSTOSCOPY;  Surgeon: Reece Packer, MD;  Location: [facility];  Service: Urology;  Laterality: N/A;   CYSTOSCOPY WITH INJECTION  06/17/2012   Procedure: CYSTOSCOPY WITH INJECTION;  Surgeon: Reece Packer, MD;  Location: [place];  Service: Urology;  Laterality: N/A;  BOTOX   CYSTOSCOPY WITH INJECTION N/A 12/08/2012   Procedure: CYSTOSCOPY WITH BOTOX INJECTION;  Surgeon: Reece Packer, MD;  Location: [place];  Service: Urology;  Laterality: N/A;   laparoscopy ovarian cystectomy  2000 (APPROX)   left rotator cuff repair  OCT 2009   POSTERIOR FUSION CERVICAL SPINE  06-29-2009   C4 - 5   PUBOVAGINAL SLING N/A 05/01/2013   Procedure:  Lowella Dell;  Surgeon: Reece Packer, MD;  Location[facility];  Service: Urology;  Laterality: N/A;   ROTATOR CUFF REPAIR  2001   RIGHT SHOULDER   TONSILLECTOMY  1981   VAGINAL HYSTERECTOMY  1992   Social History   Occupational History   Occupation: retired  Tobacco Use   Smoking status: Former    Packs/day: 0.10    Years: 20.00    Total pack years: 2.00    Types: Cigarettes    Quit date: 07/16/2006    Years since quitting: 15.8    Passive exposure: Never   Smokeless tobacco: Never  Vaping Use   Vaping Use: Never used  Substance and Sexual Activity   Alcohol use: Yes    Comment: rare   Drug use: No   Sexual activity: Not on file

## 2022-06-05 NOTE — Progress Notes (Unsigned)
Mri review.

## 2022-06-20 ENCOUNTER — Ambulatory Visit: Payer: Self-pay

## 2022-06-20 ENCOUNTER — Ambulatory Visit (INDEPENDENT_AMBULATORY_CARE_PROVIDER_SITE_OTHER): Payer: Medicare Other | Admitting: Physical Medicine and Rehabilitation

## 2022-06-20 VITALS — BP 133/73 | HR 74

## 2022-06-20 DIAGNOSIS — M5416 Radiculopathy, lumbar region: Secondary | ICD-10-CM | POA: Diagnosis not present

## 2022-06-20 MED ORDER — METHYLPREDNISOLONE ACETATE 80 MG/ML IJ SUSP
80.0000 mg | Freq: Once | INTRAMUSCULAR | Status: AC
  Administered 2022-06-20: 80 mg

## 2022-06-20 NOTE — Progress Notes (Signed)
Numeric Pain Rating Scale and Functional Assessment Average Pain 5   In the last MONTH (on 0-10 scale) has pain interfered with the following?  1. General activity like being  able to carry out your everyday physical activities such as walking, climbing stairs, carrying groceries, or moving a chair?  Rating(0)   +Driver, -BT, -Dye Allergies.   Pain when sitting still and radiates down both legs, but left worse than right. Takes Tramadol and Tylenol for pain

## 2022-06-20 NOTE — Patient Instructions (Signed)

## 2022-06-27 NOTE — Progress Notes (Signed)
Brenda Gray - 61 y.o. female MRN [id number]  Date of birth: 02-Dec-1960  Office Visit Note: Visit Date: 06/20/2022 PCP: Donnajean Lopes, MD Referred by: Donnajean Lopes, MD  Subjective: Chief Complaint  Patient presents with   Lower Back - Pain   HPI:  Brenda Gray is a 61 y.o. female who comes in today at the request of Barnet Pall, FNP for planned Bilateral S1-2 Lumbar Transforaminal epidural steroid injection with fluoroscopic guidance.  The patient has failed conservative care including home exercise, medications, time and activity modification.  This injection will be diagnostic and hopefully therapeutic.  Please see requesting physician notes for further details and justification.   ROS Otherwise per HPI.  Assessment & Plan: Visit Diagnoses:    ICD-10-CM   1. Lumbar radiculopathy  M54.16 XR C-ARM NO REPORT    Epidural Steroid injection    methylPREDNISolone acetate (DEPO-MEDROL) injection 80 mg      Plan: No additional findings.   Meds & Orders:  Meds ordered this encounter  Medications   methylPREDNISolone acetate (DEPO-MEDROL) injection 80 mg    Orders Placed This Encounter  Procedures   XR C-ARM NO REPORT   Epidural Steroid injection    Follow-up: Return for visit to requesting provider as needed.   Procedures: No procedures performed  S1 Lumbosacral Transforaminal Epidural Steroid Injection - Sub-Pedicular Approach with Fluoroscopic Guidance   Patient: Brenda Gray      Date of Birth: 01-Nov-1960 MRN: [id number] PCP: Donnajean Lopes, MD      Visit Date: 06/20/2022   Universal Protocol:    Date/Time: 10/18/237:39 AM  Consent Given By: the patient  Position:  PRONE  Additional Comments: Vital signs were monitored before and after the procedure. Patient was prepped and draped in the usual sterile fashion. The correct patient, procedure, and site was verified.   Injection Procedure Details:  Procedure Site One Meds  Administered:  Meds ordered this encounter  Medications   methylPREDNISolone acetate (DEPO-MEDROL) injection 80 mg    Laterality: Bilateral  Location/Site:  S1 Foramen   Needle size: 22 ga.  Needle type: Spinal  Needle Placement: Transforaminal  Findings:   -Comments: Excellent flow of contrast along the nerve, nerve root and into the epidural space.  Epidurogram: Contrast epidurogram showed no nerve root cut off or restricted flow pattern.  Procedure Details: After squaring off the sacral end-plate to get a true AP view, the C-arm was positioned so that the best possible view of the S1 foramen was visualized. The soft tissues overlying this structure were infiltrated with 2-3 ml. of 1% Lidocaine without Epinephrine.    The spinal needle was inserted toward the target using a "trajectory" view along the fluoroscope beam.  Under AP and lateral visualization, the needle was advanced so it did not puncture dura. Biplanar projections were used to confirm position. Aspiration was confirmed to be negative for CSF and/or blood. A 1-2 ml. volume of Isovue-250 was injected and flow of contrast was noted at each level. Radiographs were obtained for documentation purposes.   After attaining the desired flow of contrast documented above, a 0.5 to 1.0 ml test dose of 0.25% Marcaine was injected into each respective transforaminal space.  The patient was observed for 90 seconds post injection.  After no sensory deficits were reported, and normal lower extremity motor function was noted,   the above injectate was administered so that equal amounts of the injectate were placed at each foramen (level) into the  transforaminal epidural space.   Additional Comments:  The patient tolerated the procedure well Dressing: Band-Aid with 2 x 2 sterile gauze    Post-procedure details: Patient was observed during the procedure. Post-procedure instructions were reviewed.  Patient left the clinic in stable  condition.   Clinical History: No specialty comments available.     Objective:  VS:  HT:    WT:   BMI:     BP:133/73  HR:74bpm  TEMP: ( )  RESP:  Physical Exam Vitals and nursing note reviewed.  Constitutional:      General: She is not in acute distress.    Appearance: Normal appearance. She is not ill-appearing.  HENT:     Head: Normocephalic and atraumatic.     Right Ear: External ear normal.     Left Ear: External ear normal.  Eyes:     Extraocular Movements: Extraocular movements intact.  Cardiovascular:     Rate and Rhythm: Normal rate.     Pulses: Normal pulses.  Pulmonary:     Effort: Pulmonary effort is normal. No respiratory distress.  Abdominal:     General: There is no distension.     Palpations: Abdomen is soft.  Musculoskeletal:        General: Tenderness present.     Cervical back: Neck supple.     Right lower leg: No edema.     Left lower leg: No edema.     Comments: Patient has good distal strength with no pain over the greater trochanters.  No clonus or focal weakness.  Skin:    Findings: No erythema, lesion or rash.  Neurological:     General: No focal deficit present.     Mental Status: She is alert and oriented to person, place, and time.     Sensory: No sensory deficit.     Motor: No weakness or abnormal muscle tone.     Coordination: Coordination normal.  Psychiatric:        Mood and Affect: Mood normal.        Behavior: Behavior normal.      Imaging: No results found.

## 2022-06-27 NOTE — Procedures (Signed)
S1 Lumbosacral Transforaminal Epidural Steroid Injection - Sub-Pedicular Approach with Fluoroscopic Guidance   Patient: Brenda Gray      Date of Birth: [birth year] MRN: [id number] PCP: Donnajean Lopes, MD      Visit Date: 06/20/2022   Universal Protocol:    Date/Time: 10/18/237:39 AM  Consent Given By: the patient  Position:  PRONE  Additional Comments: Vital signs were monitored before and after the procedure. Patient was prepped and draped in the usual sterile fashion. The correct patient, procedure, and site was verified.   Injection Procedure Details:  Procedure Site One Meds Administered:  Meds ordered this encounter  Medications   methylPREDNISolone acetate (DEPO-MEDROL) injection 80 mg    Laterality: Bilateral  Location/Site:  S1 Foramen   Needle size: 22 ga.  Needle type: Spinal  Needle Placement: Transforaminal  Findings:   -Comments: Excellent flow of contrast along the nerve, nerve root and into the epidural space.  Epidurogram: Contrast epidurogram showed no nerve root cut off or restricted flow pattern.  Procedure Details: After squaring off the sacral end-plate to get a true AP view, the C-arm was positioned so that the best possible view of the S1 foramen was visualized. The soft tissues overlying this structure were infiltrated with 2-3 ml. of 1% Lidocaine without Epinephrine.    The spinal needle was inserted toward the target using a "trajectory" view along the fluoroscope beam.  Under AP and lateral visualization, the needle was advanced so it did not puncture dura. Biplanar projections were used to confirm position. Aspiration was confirmed to be negative for CSF and/or blood. A 1-2 ml. volume of Isovue-250 was injected and flow of contrast was noted at each level. Radiographs were obtained for documentation purposes.   After attaining the desired flow of contrast documented above, a 0.5 to 1.0 ml test dose of 0.25% Marcaine was injected  into each respective transforaminal space.  The patient was observed for 90 seconds post injection.  After no sensory deficits were reported, and normal lower extremity motor function was noted,   the above injectate was administered so that equal amounts of the injectate were placed at each foramen (level) into the transforaminal epidural space.   Additional Comments:  The patient tolerated the procedure well Dressing: Band-Aid with 2 x 2 sterile gauze    Post-procedure details: Patient was observed during the procedure. Post-procedure instructions were reviewed.  Patient left the clinic in stable condition.

## 2022-07-05 DIAGNOSIS — Z1212 Encounter for screening for malignant neoplasm of rectum: Secondary | ICD-10-CM | POA: Diagnosis not present

## 2022-07-05 DIAGNOSIS — E041 Nontoxic single thyroid nodule: Secondary | ICD-10-CM | POA: Diagnosis not present

## 2022-07-05 DIAGNOSIS — I10 Essential (primary) hypertension: Secondary | ICD-10-CM | POA: Diagnosis not present

## 2022-07-05 DIAGNOSIS — E785 Hyperlipidemia, unspecified: Secondary | ICD-10-CM | POA: Diagnosis not present

## 2022-07-05 DIAGNOSIS — R7989 Other specified abnormal findings of blood chemistry: Secondary | ICD-10-CM | POA: Diagnosis not present

## 2022-07-12 DIAGNOSIS — Z0000 Encounter for general adult medical examination without abnormal findings: Secondary | ICD-10-CM | POA: Diagnosis not present

## 2022-07-12 DIAGNOSIS — M5030 Other cervical disc degeneration, unspecified cervical region: Secondary | ICD-10-CM | POA: Diagnosis not present

## 2022-07-12 DIAGNOSIS — M5136 Other intervertebral disc degeneration, lumbar region: Secondary | ICD-10-CM | POA: Diagnosis not present

## 2022-07-12 DIAGNOSIS — I10 Essential (primary) hypertension: Secondary | ICD-10-CM | POA: Diagnosis not present

## 2022-07-12 DIAGNOSIS — R82998 Other abnormal findings in urine: Secondary | ICD-10-CM | POA: Diagnosis not present

## 2022-07-13 NOTE — Progress Notes (Signed)
Office Visit Note  Patient: Brenda Gray             Date of Birth: April 26, 1961           MRN: [id number]             PCP: Donnajean Lopes, MD Referring: Donnajean Lopes, MD Visit Date: 07/27/2022 Occupation: '@GUAROCC'$ @  Subjective:  No chief complaint on file.   History of Present Illness: SAVANNAHA STONEROCK is a 61 y.o. female 3 of osteoarthritis and degenerative disc disease.  She states she continues to have pain and stiffness in her bilateral hands especially with the weather change.  She has been also having pain and discomfort in her left shoulder joint.  She had a good response to left subacromial bursa injection in May 2023.  She would like to have repeat injection.  She continues to have discomfort in her bilateral trochanteric bursa off-and-on.  Nuys any discomfort in her knees today.  She states recently she has been having increased pain in her foot which she believes is due to Morton's neuroma.  She states she saw Dr. Sharol Given who recommended surgery on her right foot.  She has been seeing Dr. Marlou Sa for her disc disease.  She recently had epidural injection on June 20, 2022 which helped her lower back pain.  Her last Reclast infusion was on August 18, 2021.  She is on a drug holiday currently.  She has been taking calcium and vitamin D.  Activities of Daily Living:  Patient reports morning stiffness for 30-60 minutes.   Patient Reports nocturnal pain.  Difficulty dressing/grooming: Denies Difficulty climbing stairs: Denies Difficulty getting out of chair: Denies Difficulty using hands for taps, buttons, cutlery, and/or writing: Reports  Review of Systems  Constitutional:  Negative for fatigue.  HENT:  Negative for mouth sores and mouth dryness.   Eyes:  Negative for dryness.  Respiratory:  Negative for shortness of breath.   Cardiovascular:  Negative for chest pain and palpitations.  Gastrointestinal:  Negative for blood in stool, constipation and diarrhea.   Endocrine: Negative for increased urination.  Genitourinary:  Negative for involuntary urination.  Musculoskeletal:  Positive for joint pain, joint pain and morning stiffness. Negative for gait problem, joint swelling, myalgias, muscle weakness, muscle tenderness and myalgias.  Skin:  Negative for color change, rash, hair loss and sensitivity to sunlight.  Allergic/Immunologic: Negative for susceptible to infections.  Neurological:  Negative for dizziness and headaches.  Hematological:  Negative for swollen glands.  Psychiatric/Behavioral:  Negative for depressed mood and sleep disturbance. The patient is not nervous/anxious.     PMFS History:  Patient Active Problem List   Diagnosis Date Noted   Atypical chest pain 06/04/2017   S/P rotator cuff repair 11/04/2016   Dyslipidemia 11/04/2016   History of peptic ulcer disease 11/04/2016   Interstitial cystitis 11/04/2016   DJD (degenerative joint disease), cervical 10/29/2016   Spondylosis of lumbar region without myelopathy or radiculopathy 10/29/2016   Primary osteoarthritis of both knees 10/29/2016   DDD (degenerative disc disease), thoracic 10/29/2016   Primary insomnia 10/29/2016   History of recurrent cystitis 10/29/2016   Primary osteoarthritis of both hands 10/29/2016   Morton neuroma, right 10/15/2016   DYSPHAGIA UNSPECIFIED 07/31/2010   ABDOMINAL PAIN, EPIGASTRIC 07/31/2010    Past Medical History:  Diagnosis Date   Arthritis    Atypical chest pain 06/04/2017   Cataract    bilateral-removed 2 yrs ago 06/25/19   Chronic headache S/P CERVICAL  FUSION'S   Cystitis, interstitial    frequency/urgency/nocturia   DDD (degenerative disc disease), cervical    Dry eyes    Frequency of urination    H. pylori infection    H/O: upper GI bleed    Hyperlipidemia    Mild acid reflux OCCASIONAL--  WATCHES DIET   Nocturia    Numbness s/p cerival fusion   right side pain and numbness    Osteopenia    Pulmonary nodule noted in 2010    right upper lobe -- stable (monitored by pcp)   Status post dilation of esophageal narrowing 2011   Urge incontinence of urine     Family History  Problem Relation Age of Onset   Hypertension Mother    Heart failure Mother    Colon polyps Mother    Other Father        had to have esogasus dilated   Heart disease Brother    Stroke Maternal Grandmother    Hypertension Brother    Heart disease Brother    Healthy Son    Esophageal cancer Neg Hx    Colon cancer Neg Hx    Rectal cancer Neg Hx    Stomach cancer Neg Hx    Past Surgical History:  Procedure Laterality Date   CATARACT EXTRACTION, BILATERAL  07/2017   with lens implants   CERVICAL DISKECTOMY AND FUSION  05-14-2002   C5 - 7   COLONOSCOPY     CYSTO WITH HYDRODISTENSION  07/19/2011   Procedure: CYSTOSCOPY/HYDRODISTENSION;  Surgeon: Malka So;  Location: [facility];  Service: Urology;  Laterality: N/A;  CYSTOSCOPY WITH HYDORDISTENTION OF BLADDER AND INSTILLATION OF MARCAINE AND PYRIDIUM   cysto/ hod  03-10-2009   [facility]   CYSTOSCOPY N/A 05/01/2013   Procedure: CYSTOSCOPY;  Surgeon: Reece Packer, MD;  Location: [facility];  Service: Urology;  Laterality: N/A;   CYSTOSCOPY WITH INJECTION  06/17/2012   Procedure: CYSTOSCOPY WITH INJECTION;  Surgeon: Reece Packer, MD;  Location: [facility];  Service: Urology;  Laterality: N/A;  BOTOX   CYSTOSCOPY WITH INJECTION N/A 12/08/2012   Procedure: CYSTOSCOPY WITH BOTOX INJECTION;  Surgeon: Reece Packer, MD;  Location: [facility];  Service: Urology;  Laterality: N/A;   laparoscopy ovarian cystectomy  2000 (APPROX)   left rotator cuff repair  OCT 2009   POSTERIOR FUSION CERVICAL SPINE  06-29-2009   C4 - 5   PUBOVAGINAL SLING N/A 05/01/2013   Procedure:  Lowella Dell;  Surgeon: Reece Packer, MD;  Location[facility];  Service: Urology;  Laterality: N/A;   ROTATOR CUFF REPAIR  2001    RIGHT SHOULDER   TONSILLECTOMY  1981   VAGINAL HYSTERECTOMY  1992   Social History   Social History Narrative   Not on file   Immunization History  Administered Date(s) Administered   PFIZER(Purple Top)SARS-COV-2 Vaccination 11/27/2019, 12/18/2019, 06/24/2020, 01/12/2021   Tdap 02/01/2019     Objective: Vital Signs: BP 131/75 (BP Location: Left Arm, Patient Position: Sitting, Cuff Size: Normal)   Pulse 66   Resp 15   Ht 5' 4.5" (1.638 m)   Wt 130 lb (59 kg)   BMI 21.97 kg/m    Physical Exam Vitals and nursing note reviewed.  Constitutional:      Appearance: She is well-developed.  HENT:     Head: Normocephalic and atraumatic.  Eyes:     Conjunctiva/sclera: Conjunctivae normal.  Cardiovascular:  Rate and Rhythm: Normal rate and regular rhythm.     Heart sounds: Normal heart sounds.  Pulmonary:     Effort: Pulmonary effort is normal.     Breath sounds: Normal breath sounds.  Abdominal:     General: Bowel sounds are normal.     Palpations: Abdomen is soft.  Musculoskeletal:     Cervical back: Normal range of motion.  Lymphadenopathy:     Cervical: No cervical adenopathy.  Skin:    General: Skin is warm and dry.     Capillary Refill: Capillary refill takes less than 2 seconds.  Neurological:     Mental Status: She is alert and oriented to person, place, and time.  Psychiatric:        Behavior: Behavior normal.      Musculoskeletal Exam: She had good range of motion of the cervical spine.  She had painful range of motion of the lumbar spine.  She had painful abduction of her left shoulder joint.  Both shoulder joints with good range of motion.  Elbow joints in good range of motion.  Bilateral severe PIP and DIP thickening with no synovitis was noted.  Hip joints were in good range of motion with mild tenderness over trochanteric region.  Bilateral knee joints and ankle joints with good range of motion without any warmth swelling or effusion.  Hallux rigidus was  noted in her right foot.  She had tenderness between the third and the fourth toe consistent with Morton's neuroma.  No synovitis was noted.  CDAI Exam: CDAI Score: -- Patient Global: --; Provider Global: -- Swollen: --; Tender: -- Joint Exam 07/27/2022   No joint exam has been documented for this visit   There is currently no information documented on the homunculus. Go to the Rheumatology activity and complete the homunculus joint exam.  Investigation: No additional findings.  Imaging: No results found.  Recent Labs: Lab Results  Component Value Date   WBC 5.5 07/31/2021   HGB 15.0 07/31/2021   PLT 248 07/31/2021   NA 142 07/31/2021   K 5.2 07/31/2021   CL 104 07/31/2021   CO2 30 07/31/2021   GLUCOSE 91 07/31/2021   BUN 16 07/31/2021   CREATININE 0.81 07/31/2021   BILITOT 0.6 07/31/2021   ALKPHOS 65 11/05/2016   AST 30 07/31/2021   ALT 28 07/31/2021   PROT 7.4 07/31/2021   ALBUMIN 4.3 11/05/2016   CALCIUM 10.1 07/31/2021   GFRAA 98 12/17/2017    Speciality Comments: Dexa: 09/08/18 Osteopenia T-Score -2.2 BMD 0.800 Hold reclast  Procedures:  Large Joint Inj: L subacromial bursa on 07/27/2022 8:18 AM Indications: pain Details: 27 G 1.5 in needle, posterior approach  Arthrogram: No  Medications: 1 mL lidocaine 1 %; 40 mg triamcinolone acetonide 40 MG/ML Aspirate: 0 mL Outcome: tolerated well, no immediate complications Procedure, treatment alternatives, risks and benefits explained, specific risks discussed. Consent was given by the patient. Immediately prior to procedure a time out was called to verify the correct patient, procedure, equipment, support staff and site/side marked as required. Patient was prepped and draped in the usual sterile fashion.     Allergies: Cymbalta [duloxetine hcl] and Lyrica [pregabalin]   Assessment / Plan:     Visit Diagnoses: Chronic left shoulder pain-she has chronic discomfort in her left shoulder joint which flares  off-and-on.  She had rotator cuff repair in the past.  She had good response to cortisone injection in May 2023.  She states the pain has recurred now.  She is having difficulty raising her arm.  She requested a cortisone injection.  After informed consent was obtained and side effects were discussed left subacromial bursa region was injected with lidocaine and cortisone as described above.  Postprocedure instructions were given.  Patient tolerated the procedure well.  A handout on shoulder exercises was given.  S/P rotator cuff repair - Bilateral- Performed by Dr. Marlou Sa.    Primary osteoarthritis of both knees-she has off-and-on discomfort in her knee joints.  Both knee joints with good range of motion without any warmth swelling or effusion.  Lower extremity muscle strengthening exercises were discussed.  Primary osteoarthritis of both hands-she has severe osteoarthritis in her hands.  She has difficulty with fine motor movement.  Muscle strengthening exercises and joint protection was discussed.  Trochanteric bursitis of both hips-she has intermittent discomfort.  IT band stretches were discussed.  Primary osteoarthritis of both feet-patient had surgery on her right fourth and fifth toe in the past.  She has been having recurrent problems with the right Morton's neuroma.  She had failure to cortisone injections.  She also has right hallux rigidus.  She was evaluated by Dr. Sharol Given recently who suggested surgery.  DDD (degenerative disc disease), cervical-she had good range of motion with some stiffness.  DDD (degenerative disc disease), thoracic-she has intermittent discomfort.  There is no point tenderness.  DDD (degenerative disc disease), lumbar - Epidural steroid injection performed by Dr. Ernestina Patches on June 20, 2022 and a radiofrequency ablation performed on 06/22/20.  Patient reports relief after the last epidural injection.  Age-related osteoporosis without current pathological fracture - DEXA  10/06/2020 T-score:-2.1, BMD 0.821, AP Spine.Previous IV Reclast infusions 2018 and 2019, 08/18/2021. -We will get repeat bone density after January 2024.  Will decide if patient needs repeat IV Reclast at that point.  Calcium rich diet and vitamin D supplement was emphasized.  She has been exercising on a regular basis.  Plan: DG Bone Density  Vitamin D deficiency-she takes vitamin D supplement.  History of peptic ulcer disease  Dyslipidemia  Interstitial cystitis  Orders: Orders Placed This Encounter  Procedures   Large Joint Inj   DG Bone Density   No orders of the defined types were placed in this encounter.    Follow-Up Instructions: Return in about 6 months (around 01/25/2023) for Osteoarthritis, Osteoporosis.   Bo Merino, MD  Note - This record has been created using Editor, commissioning.  Chart creation errors have been sought, but may not always  have been located. Such creation errors do not reflect on  the standard of medical care.

## 2022-07-23 ENCOUNTER — Ambulatory Visit: Payer: Self-pay

## 2022-07-23 ENCOUNTER — Telehealth: Payer: Self-pay | Admitting: Orthopaedic Surgery

## 2022-07-23 ENCOUNTER — Ambulatory Visit: Payer: Medicare Other | Admitting: Orthopedic Surgery

## 2022-07-23 DIAGNOSIS — M7741 Metatarsalgia, right foot: Secondary | ICD-10-CM

## 2022-07-23 DIAGNOSIS — M79671 Pain in right foot: Secondary | ICD-10-CM

## 2022-07-23 DIAGNOSIS — M2021 Hallux rigidus, right foot: Secondary | ICD-10-CM

## 2022-07-23 NOTE — Progress Notes (Signed)
Office Visit Note   Patient: Brenda Gray           Date of Birth: 19-Aug-1961           MRN: [id number] Visit Date: 07/23/2022              Requested by: Donnajean Lopes, MD [address] [place],  [place] [address] PCP: Donnajean Lopes, MD  Chief Complaint  Patient presents with   Right Foot - Pain      HPI: Patient is a 61 year old woman who presents complaining of burning pain beneath her second and third metatarsal heads.  Patient was seen years ago with symptoms over the third webspace.  She underwent a steroid injection without much improvement.  Patient states she does have pain with shoewear.  Assessment & Plan: Visit Diagnoses:  1. Pain in right foot   2. Metatarsalgia, right foot     Plan: With the long second and third metatarsal and pain beneath the second and third metatarsal have recommended proceeding with a Weil osteotomy for the second and third metatarsals.  Risk and benefits were discussed including infection neurovascular injury difficulty with healing.  Discussed risk of additional surgery for the hallux rigidus of the great toe.  Patient states she understands wished to proceed at this time.  Follow-Up Instructions: No follow-ups on file.   Ortho Exam  Patient is alert, oriented, no adenopathy, well-dressed, normal affect, normal respiratory effort. Examination patient is a good dorsalis pedis and posterior tibial pulse she has dorsiflexion of the ankle about 20 degrees past neutral.  She does have hallux rigidus of the great toe with range of motion from 0 to 30 degrees of dorsiflexion there is minimal pain with range of motion.  Patient's pain is reproduced with palpation beneath the second and third metatarsal heads.  Radiograph shows a long second and third metatarsal.  Radiographs also show bony spurs and joint space narrowing great toe MTP joint.  Imaging: No results found. No images are attached to the encounter.  Labs: No results  found for: "HGBA1C", "ESRSEDRATE", "CRP", "LABURIC", "REPTSTATUS", "GRAMSTAIN", "CULT", "LABORGA"   Lab Results  Component Value Date   ALBUMIN 4.3 11/05/2016    Lab Results  Component Value Date   MG 1.9 11/05/2016   Lab Results  Component Value Date   VD25OH 42 07/31/2021   VD25OH 41 08/29/2017   VD25OH 41 11/05/2016    No results found for: "PREALBUMIN"    Latest Ref Rng & Units 07/31/2021    8:34 AM 11/12/2017    8:38 AM 08/29/2017   10:30 AM  CBC EXTENDED  WBC 3.8 - 10.8 Thousand/uL 5.5  5.3  5.9   RBC 3.80 - 5.10 Million/uL 5.33  4.79  4.73   Hemoglobin 11.7 - 15.5 g/dL 15.0  13.6  13.5   HCT 35.0 - 45.0 % 46.5  41.0  41.0   Platelets 140 - 400 Thousand/uL 248  250  260   NEUT# 1,500 - 7,800 cells/uL 3,014  2,353  2,608   Lymph# 850 - 3,900 cells/uL 2,134  2,528  2,897      There is no height or weight on file to calculate BMI.  Orders:  Orders Placed This Encounter  Procedures   XR Foot 2 Views Right   No orders of the defined types were placed in this encounter.    Procedures: No procedures performed  Clinical Data: No additional findings.  ROS:  All other systems negative, except  as noted in the HPI. Review of Systems  Objective: Vital Signs: There were no vitals taken for this visit.  Specialty Comments:  No specialty comments available.  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 pylori infection    H/O: upper GI bleed    Hyperlipidemia    Mild acid reflux OCCASIONAL--  WATCHES DIET   Nocturia    Numbness s/p cerival fusion   right side pain and numbness    Osteopenia    Pulmonary nodule noted in 2010   right upper lobe -- stable (monitored by pcp)   Status post dilation of esophageal narrowing 2011   Urge incontinence of urine     Family History  Problem Relation Age of Onset   Hypertension Mother    Heart failure Mother    Colon polyps Mother    Other Father        had to have esogasus dilated   Heart disease Brother    Stroke Maternal Grandmother    Hypertension Brother    Heart disease Brother    Healthy Son    Esophageal cancer Neg Hx    Colon cancer Neg Hx    Rectal cancer Neg Hx    Stomach cancer Neg Hx     Past Surgical History:  Procedure Laterality Date   CATARACT EXTRACTION, BILATERAL  07/2017   with lens implants   CERVICAL DISKECTOMY AND FUSION  05-14-2002   C5 - 7   COLONOSCOPY     CYSTO WITH HYDRODISTENSION  07/19/2011   Procedure: CYSTOSCOPY/HYDRODISTENSION;  Surgeon: Malka So;  Location: [facility];  Service: Urology;  Laterality: N/A;  CYSTOSCOPY WITH HYDORDISTENTION OF BLADDER AND INSTILLATION OF MARCAINE AND PYRIDIUM   cysto/ hod  03-10-2009   [facility]   CYSTOSCOPY N/A 05/01/2013   Procedure: CYSTOSCOPY;  Surgeon: Reece Packer, MD;  Location: [facility];  Service: Urology;  Laterality: N/A;   CYSTOSCOPY WITH INJECTION  06/17/2012   Procedure: CYSTOSCOPY WITH  INJECTION;  Surgeon: Reece Packer, MD;  Location: [place];  Service: Urology;  Laterality: N/A;  BOTOX   CYSTOSCOPY WITH INJECTION N/A 12/08/2012   Procedure: CYSTOSCOPY WITH BOTOX INJECTION;  Surgeon: Reece Packer, MD;  Location: [place];  Service: Urology;  Laterality: N/A;   laparoscopy ovarian cystectomy  2000 (APPROX)   left rotator cuff repair  OCT 2009   POSTERIOR FUSION CERVICAL SPINE  06-29-2009   C4 - 5  PUBOVAGINAL SLING N/A 05/01/2013   Procedure:  Lowella Dell;  Surgeon: Reece Packer, MD;  Location: [facility];  Service: Urology;  Laterality: N/A;   ROTATOR CUFF REPAIR  2001   RIGHT SHOULDER   TONSILLECTOMY  1981   VAGINAL HYSTERECTOMY  1992   Social History   Occupational History   Occupation: retired  Tobacco Use   Smoking status: Former    Packs/day: 0.10    Years: 20.00    Total pack years: 2.00    Types: Cigarettes    Quit date: 07/16/2006    Years since quitting: 16.0    Passive exposure: Never   Smokeless tobacco: Never  Vaping Use   Vaping Use: Never used  Substance and Sexual Activity   Alcohol use: Yes    Comment: rare   Drug use: No   Sexual activity: Not on file

## 2022-07-23 NOTE — Telephone Encounter (Signed)
Wrong doctor so sorry

## 2022-07-23 NOTE — Telephone Encounter (Signed)
I SW pt, she is going to be scheduled for a weil osteotomy ( was seen in office this morning). Discussed possible additional surgery for hallux rigidus of great toe. She said over phone that it actually has been bothering and painful for her and asked if you can go ahead and do surgery for this at the same time?

## 2022-07-23 NOTE — Telephone Encounter (Signed)
Pt called and states that she wants to take care of everything when she does her surgery.

## 2022-07-27 ENCOUNTER — Ambulatory Visit: Payer: Medicare Other | Attending: Rheumatology | Admitting: Rheumatology

## 2022-07-27 ENCOUNTER — Encounter: Payer: Self-pay | Admitting: Rheumatology

## 2022-07-27 ENCOUNTER — Telehealth: Payer: Self-pay | Admitting: Orthopedic Surgery

## 2022-07-27 VITALS — BP 131/75 | HR 66 | Resp 15 | Ht 64.5 in | Wt 130.0 lb

## 2022-07-27 DIAGNOSIS — M25512 Pain in left shoulder: Secondary | ICD-10-CM

## 2022-07-27 DIAGNOSIS — M19042 Primary osteoarthritis, left hand: Secondary | ICD-10-CM

## 2022-07-27 DIAGNOSIS — M19071 Primary osteoarthritis, right ankle and foot: Secondary | ICD-10-CM

## 2022-07-27 DIAGNOSIS — G8929 Other chronic pain: Secondary | ICD-10-CM

## 2022-07-27 DIAGNOSIS — M7062 Trochanteric bursitis, left hip: Secondary | ICD-10-CM

## 2022-07-27 DIAGNOSIS — M170 Bilateral primary osteoarthritis of knee: Secondary | ICD-10-CM | POA: Diagnosis not present

## 2022-07-27 DIAGNOSIS — E559 Vitamin D deficiency, unspecified: Secondary | ICD-10-CM

## 2022-07-27 DIAGNOSIS — M19041 Primary osteoarthritis, right hand: Secondary | ICD-10-CM

## 2022-07-27 DIAGNOSIS — M810 Age-related osteoporosis without current pathological fracture: Secondary | ICD-10-CM

## 2022-07-27 DIAGNOSIS — M7061 Trochanteric bursitis, right hip: Secondary | ICD-10-CM

## 2022-07-27 DIAGNOSIS — Z9889 Other specified postprocedural states: Secondary | ICD-10-CM | POA: Diagnosis not present

## 2022-07-27 DIAGNOSIS — M5136 Other intervertebral disc degeneration, lumbar region: Secondary | ICD-10-CM

## 2022-07-27 DIAGNOSIS — M5134 Other intervertebral disc degeneration, thoracic region: Secondary | ICD-10-CM

## 2022-07-27 DIAGNOSIS — M5030 Other cervical disc degeneration, unspecified cervical region: Secondary | ICD-10-CM

## 2022-07-27 DIAGNOSIS — E785 Hyperlipidemia, unspecified: Secondary | ICD-10-CM

## 2022-07-27 DIAGNOSIS — Z8711 Personal history of peptic ulcer disease: Secondary | ICD-10-CM

## 2022-07-27 DIAGNOSIS — M19072 Primary osteoarthritis, left ankle and foot: Secondary | ICD-10-CM

## 2022-07-27 DIAGNOSIS — N3010 Interstitial cystitis (chronic) without hematuria: Secondary | ICD-10-CM

## 2022-07-27 MED ORDER — LIDOCAINE HCL 1 % IJ SOLN
1.0000 mL | INTRAMUSCULAR | Status: AC | PRN
  Administered 2022-07-27: 1 mL

## 2022-07-27 MED ORDER — TRIAMCINOLONE ACETONIDE 40 MG/ML IJ SUSP
40.0000 mg | INTRAMUSCULAR | Status: AC | PRN
  Administered 2022-07-27: 40 mg via INTRA_ARTICULAR

## 2022-07-27 NOTE — Patient Instructions (Signed)

## 2022-07-27 NOTE — Telephone Encounter (Signed)
Pt informed

## 2022-07-27 NOTE — Telephone Encounter (Signed)
Pt called and has a few concerns about a surgery sh is suppose to be getting scheduled. Please call pt back wants to talk to nurse!  [phone]

## 2022-07-27 NOTE — Telephone Encounter (Signed)
SW pt, she has been second guessing on having surgery on her toes for the weil osteotomy. She wondered if she can just get the neuroma taken care of? Cut out? And have the surgery for the hallux rigidus on her GT only? She said she is apprehensive on getting her toes shortened.

## 2022-08-09 ENCOUNTER — Ambulatory Visit: Payer: Medicare Other | Admitting: Podiatry

## 2022-08-09 DIAGNOSIS — M19071 Primary osteoarthritis, right ankle and foot: Secondary | ICD-10-CM | POA: Diagnosis not present

## 2022-08-09 DIAGNOSIS — G5761 Lesion of plantar nerve, right lower limb: Secondary | ICD-10-CM

## 2022-08-09 NOTE — Progress Notes (Signed)
Subjective:   Patient ID: Brenda Gray, female   DOB: 61 y.o.   MRN: [id number]   HPI Chief Complaint  Patient presents with   Neuroma    Patient came in today for a right great toe spur/ history of neuroma/ second opinion, TX: injection, X-Rays taken nov 13,2023, Patient is having pain in the bunion, and forefoot between 2nd and 3rd toes, rate of pain 6 out of 103,     61 year old female presents for above concerns.  She has had a neuroma for several years on the right side. She has tried multiples shoe, inserts, carbon fiber inserts, injections without any improvement.  She states the injections that helped for only a couple days.  She has not found anything to be providing long-term relief.  She was walking differently and thought it was causing her big toe to hurt.  No recent injuries.   Review of Systems  All other systems reviewed and are negative.  Past Medical History:  Diagnosis Date   Arthritis    Atypical chest pain 06/04/2017   Cataract    bilateral-removed 2 yrs ago 06/25/19   Chronic headache S/P CERVICAL FUSION'S   Cystitis, interstitial    frequency/urgency/nocturia   DDD (degenerative disc disease), cervical    Dry eyes    Frequency of urination    H. pylori infection    H/O: upper GI bleed    Hyperlipidemia    Mild acid reflux OCCASIONAL--  WATCHES DIET   Nocturia    Numbness s/p cerival fusion   right side pain and numbness    Osteopenia    Pulmonary nodule noted in 2010   right upper lobe -- stable (monitored by pcp)   Status post dilation of esophageal narrowing 2011   Urge incontinence of urine     Past Surgical History:  Procedure Laterality Date   CATARACT EXTRACTION, BILATERAL  07/2017   with lens implants   CERVICAL DISKECTOMY AND FUSION  05-14-2002   C5 - 7   COLONOSCOPY     CYSTO WITH HYDRODISTENSION  07/19/2011   Procedure: CYSTOSCOPY/HYDRODISTENSION;  Surgeon: Malka So;  Location: [place];  Service: Urology;   Laterality: N/A;  CYSTOSCOPY WITH HYDORDISTENTION OF BLADDER AND INSTILLATION OF MARCAINE AND PYRIDIUM   cysto/ hod  03-10-2009   [facility]   CYSTOSCOPY N/A 05/01/2013   Procedure: CYSTOSCOPY;  Surgeon: Reece Packer, MD;  Location: [facility];  Service: Urology;  Laterality: N/A;   CYSTOSCOPY WITH INJECTION  06/17/2012   Procedure: CYSTOSCOPY WITH INJECTION;  Surgeon: Reece Packer, MD;  Location: [place];  Service: Urology;  Laterality: N/A;  BOTOX   CYSTOSCOPY WITH INJECTION N/A 12/08/2012   Procedure: CYSTOSCOPY WITH BOTOX INJECTION;  Surgeon: Reece Packer, MD;  Location: [place];  Service: Urology;  Laterality: N/A;   laparoscopy ovarian cystectomy  2000 (APPROX)   left rotator cuff repair  OCT 2009   POSTERIOR FUSION CERVICAL SPINE  06-29-2009   C4 - 5   PUBOVAGINAL SLING N/A 05/01/2013   Procedure:  Lowella Dell;  Surgeon: Reece Packer, MD;  Location: [facility];  Service: Urology;  Laterality: N/A;   ROTATOR CUFF REPAIR  2001   RIGHT SHOULDER   TONSILLECTOMY  1981   VAGINAL HYSTERECTOMY  1992     Current Outpatient Medications:    ALPRAZolam (XANAX) 0.5 MG tablet, Take 0.5 mg by mouth at bedtime., Disp: , Rfl:  Calcium Polycarbophil (FIBER-CAPS PO), Take 5 capsules by mouth 2 (two) times daily., Disp: , Rfl:    CALCIUM-VITAMIN D PO, Take 1 tablet by mouth daily., Disp: , Rfl:    Cetirizine HCl (ZYRTEC ALLERGY PO), Take 1 tablet by mouth daily. , Disp: , Rfl:    famotidine (PEPCID) 20 MG tablet, TAKE 1 TABLET(20 MG) BY MOUTH TWICE DAILY (Patient not taking: Reported on 07/27/2022), Disp: 180 tablet, Rfl: 3   Ginger, Zingiber officinalis, (GINGER PO), Take 1 tablet by mouth daily. , Disp: , Rfl:    HYDROcodone-acetaminophen (NORCO/VICODIN) 5-325 MG tablet, Take 1-2 tablets by mouth 2 (two) times daily as needed., Disp: , Rfl:    losartan (COZAAR) 50 MG tablet, Take 50 mg by mouth daily., Disp: ,  Rfl:    Misc Natural Products (TART CHERRY ADVANCED) CAPS, , Disp: , Rfl:    Multiple Vitamin (MULTIVITAMIN) tablet, Take 1 tablet by mouth daily., Disp: , Rfl:    Omega-3 Fatty Acids (FISH OIL) 1000 MG CAPS, Take 1 capsule by mouth daily., Disp: , Rfl:    pantoprazole (PROTONIX) 40 MG tablet, TAKE 1 TABLET(40 MG) BY MOUTH TWICE DAILY, Disp: 180 tablet, Rfl: 3   Probiotic Product (PROBIOTIC DAILY PO), Take 1 tablet by mouth daily. , Disp: , Rfl:    rosuvastatin (CRESTOR) 20 MG tablet, Take 20 mg by mouth daily., Disp: , Rfl:    tiZANidine (ZANAFLEX) 4 MG tablet, TAKE 1 TABLET(4 MG) BY MOUTH AT BEDTIME AS NEEDED, Disp: 30 tablet, Rfl: 2   traMADol (ULTRAM) 50 MG tablet, Take 50 mg by mouth every 6 (six) hours as needed for pain., Disp: , Rfl:    TURMERIC PO, Take 1 tablet by mouth daily. , Disp: , Rfl:   Current Facility-Administered Medications:    0.9 %  sodium chloride infusion, 500 mL, Intravenous, Once, [place], MD   zoledronic acid (RECLAST) injection 5 mg, 5 mg, Intravenous, Once, Deveshwar, Shaili, MD  Allergies  Allergen Reactions   Cymbalta [Duloxetine Hcl]    Lyrica [Pregabalin] Itching           Objective:  Physical Exam  General: AAO x3, NAD  Dermatological: Skin is warm, dry and supple bilateral.  There are no open sores, no preulcerative lesions, no rash or signs of infection present.  Vascular: Dorsalis Pedis artery and Posterior Tibial artery pedal pulses are 2/4 bilateral with immedate capillary fill time.  There is no pain with calf compression, swelling, warmth, erythema.   Neruologic: Grossly intact via light touch bilateral.   Musculoskeletal: There is tenderness to palpation along the interspace on the right foot.  Upon percussion of the second third interspaces there is radiation of nerve symptoms  into the digits.  There is no area pinpoint tenderness.  Gait: Unassisted, Nonantalgic.       Assessment:   Neuroma right foot     Plan:   -Treatment options discussed including all alternatives, risks, and complications -Etiology of symptoms were discussed -X-rays were obtained and reviewed with the patient.  3 views of the foot were obtained.  Joint space narrowing noted on the first MPJ.  Elongated second and third metatarsals.  There is no evidence of acute fracture. -We discussed the conservative as well as surgical treatment options.  She is attempted numerous conservative treatments and she wants to proceed with surgical invention.  Prior to surgery I am going to order an MRI for further evaluation before surgery and she is in agreement to proceed with this. -  We discussed surgical options and if the MRI confirms neuroma proceed with excision of the neuroma.  Although she has joint space narrowing the first MPJ she thinks this is from where she is walking differently.  I think if she has surgery needed first MPJ arthrodesis and has not gotten significant pain so we will likely proceed with just a neuroma surgery and if the big toe became an issue later on then address that.  Trula Slade DPM

## 2022-08-17 ENCOUNTER — Telehealth: Payer: Self-pay | Admitting: *Deleted

## 2022-08-17 NOTE — Telephone Encounter (Signed)
error 

## 2022-08-29 ENCOUNTER — Ambulatory Visit
Admission: RE | Admit: 2022-08-29 | Discharge: 2022-08-29 | Disposition: A | Discharge status: Home / Self Care | Payer: Medicare Other | Source: Ambulatory Visit | Attending: Podiatry | Admitting: Podiatry

## 2022-08-29 DIAGNOSIS — G5761 Lesion of plantar nerve, right lower limb: Secondary | ICD-10-CM

## 2022-08-29 DIAGNOSIS — R600 Localized edema: Secondary | ICD-10-CM | POA: Diagnosis not present

## 2022-09-14 ENCOUNTER — Ambulatory Visit: Payer: Medicare Other | Admitting: Podiatry

## 2022-09-14 VITALS — BP 123/75 | HR 71

## 2022-09-14 DIAGNOSIS — G5761 Lesion of plantar nerve, right lower limb: Secondary | ICD-10-CM

## 2022-09-14 DIAGNOSIS — M19071 Primary osteoarthritis, right ankle and foot: Secondary | ICD-10-CM

## 2022-09-14 NOTE — Patient Instructions (Signed)
Pre-Operative Instructions  Congratulations, you have decided to take an important step to improving your quality of life.  You can be assured that the doctors of [facility] will be with you every step of the way.  Plan to be at the surgery center/hospital at least 1 (one) hour prior to your scheduled time unless otherwise directed by the surgical center/hospital staff.  You must have a responsible adult accompany you, remain during the surgery and drive you home.  Make sure you have directions to the surgical center/hospital and know how to get there on time. For hospital based surgery you will need to obtain a history and physical form from your family physician within 1 month prior to the date of surgery- we will give you a form for you primary physician.  We make every effort to accommodate the date you request for surgery.  There are however, times where surgery dates or times have to be moved.  We will contact you as soon as possible if a change in schedule is required.   No Aspirin/Ibuprofen for one week before surgery.  If you are on aspirin, any non-steroidal anti-inflammatory medications (Mobic, Aleve, Ibuprofen) you should stop taking it 7 days prior to your surgery.  You make take Tylenol  For pain prior to surgery.  Medications- If you are taking daily heart and blood pressure medications, seizure, reflux, allergy, asthma, anxiety, pain or diabetes medications, make sure the surgery center/hospital is aware before the day of surgery so they may notify you which medications to take or avoid the day of surgery. No food or drink after midnight the night before surgery unless directed otherwise by surgical center/hospital staff. No alcoholic beverages 24 hours prior to surgery.  No smoking 24 hours prior to or 24 hours after surgery. Wear loose pants or shorts- loose enough to fit over bandages, boots, and casts. No slip on shoes, sneakers are best. Bring your boot with you to the  surgery center/hospital.  Also bring crutches or a walker if your physician has prescribed it for you.  If you do not have this equipment, it will be provided for you after surgery. If you have not been contracted by the surgery center/hospital by the day before your surgery, call to confirm the date and time of your surgery. Leave-time from work may vary depending on the type of surgery you have.  Appropriate arrangements should be made prior to surgery with your employer. Prescriptions will be provided immediately following surgery by your doctor.  Have these filled as soon as possible after surgery and take the medication as directed. Remove nail polish on the operative foot. Wash the night before surgery.  The night before surgery wash the foot and leg well with the antibacterial soap provided and water paying special attention to beneath the toenails and in between the toes.  Rinse thoroughly with water and dry well with a towel.  Perform this wash unless told not to do so by your physician.  Enclosed: 1 Ice pack (please put in freezer the night before surgery)   1 Hibiclens skin cleaner   Pre-op Instructions  If you have any questions regarding the instructions, do not hesitate to call our office at any point during this process.   [place]: [address] [place], [place] [address] [phone]  [place]: [address] [place], [place] [address] [phone]  Dr. Arryana Tolleson, DPM  

## 2022-09-14 NOTE — Progress Notes (Signed)
Subjective: 62 year old female presents the office today for follow evaluation of her neuroma.  She states that she has been getting more pain on the first MPJ that she points to as well.  She is having ongoing pain to her feet and she wants to proceed with surgery.  Objective: AAO x3, NAD DP/PT pulses palpable bilaterally, CRT less than 3 seconds Continuation of tenderness palpation of the third interspace of the right foot and she is still describing neuroma symptoms to this area.  There is no area of pinpoint tenderness.  She has been getting more tenderness on the first MPJ.  Previously I saw her was not causing significant issues however has been causing more issues and there is pain with range of motion. No pain with calf compression, swelling, warmth, erythema  Assessment: 62 year old female with arthritis first MTPJ, concern for small neuroma  Plan: -All treatment options discussed with the patient including all alternatives, risks, complications.  -I reviewed the MRI with her.  We discussed with conservative as well as surgical treatment options.  She was to proceed with surgery for both the first MPJ as well as for the neuroma.  Although the MRI did not show neuroma she still could have 1 that was not picked up on the MRI.  Discussed with her decompression versus excision of the neuroma of the third interspace.  In regards to the first MPJ we discussed different options but given the arthritis I recommend MPJ arthrodesis.  She was to proceed with that as well. -The incision placement as well as the postoperative course was discussed with the patient. I discussed risks of the surgery which include, but not limited to, infection, bleeding, pain, swelling, need for further surgery, delayed or nonhealing, painful or ugly scar, numbness or sensation changes, over/under correction, recurrence, transfer lesions, further deformity, hardware failure, DVT/PE, loss of toe/foot. Patient understands  these risks and wishes to proceed with surgery. The surgical consent was reviewed with the patient all 3 pages were signed. No promises or guarantees were given to the outcome of the procedure. All questions were answered to the best of my ability. Before the surgery the patient was encouraged to call the office if there is any further questions. The surgery will be performed at the [facility] on an outpatient basis. -Patient encouraged to call the office with any questions, concerns, change in symptoms.   Trula Slade DPM

## 2022-09-25 ENCOUNTER — Telehealth: Payer: Self-pay | Admitting: Podiatry

## 2022-09-25 NOTE — Telephone Encounter (Signed)
DOS: 10/24/2022  [employer] Medicare Effective 09/10/2022  Hallux MPJ Fusion Rt (41712) Neurectomy Rt (78718)  Deductible: $0 Out-of-Pocket: $2,800 with $0 met CoInsurance: $2,800 with $0 met  Prior authorization is not required per Willey Blade. Call Reference #: [id number]

## 2022-09-27 ENCOUNTER — Other Ambulatory Visit: Payer: Self-pay | Admitting: Rheumatology

## 2022-09-27 NOTE — Telephone Encounter (Signed)
Next Visit: 01/31/2023  Last Visit: 07/27/2022  Last Fill: 05/30/2022  Dx: DDD (degenerative disc disease), lumbar   Current Dose per office note on 07/27/2022:   Okay to refill Tizanidine?

## 2022-10-11 DIAGNOSIS — Z1231 Encounter for screening mammogram for malignant neoplasm of breast: Secondary | ICD-10-CM | POA: Diagnosis not present

## 2022-10-11 DIAGNOSIS — M8589 Other specified disorders of bone density and structure, multiple sites: Secondary | ICD-10-CM | POA: Diagnosis not present

## 2022-10-12 ENCOUNTER — Telehealth: Payer: Self-pay | Admitting: *Deleted

## 2022-10-12 NOTE — Telephone Encounter (Signed)
Received DEXA results from [facility].  Date of Scan: 10/11/2022  Lowest T-score:-1.7  BMD:0.821  Lowest site measured:AP Spine  DX: Osteopenia  Significant changes in BMD and site measured (5% and above):5% AP Spine  Current Regimen:Calcium, Vitamin D, Reclast Last infusion 2019  Recommendation:Improved. Discuss at follow up visit.   Reviewed by:Dr. Bo Merino   Next Appointment:  01/31/2023

## 2022-10-17 DIAGNOSIS — L089 Local infection of the skin and subcutaneous tissue, unspecified: Secondary | ICD-10-CM | POA: Diagnosis not present

## 2022-10-17 DIAGNOSIS — L570 Actinic keratosis: Secondary | ICD-10-CM | POA: Diagnosis not present

## 2022-10-17 DIAGNOSIS — D485 Neoplasm of uncertain behavior of skin: Secondary | ICD-10-CM | POA: Diagnosis not present

## 2022-10-17 DIAGNOSIS — D1809 Hemangioma of other sites: Secondary | ICD-10-CM | POA: Diagnosis not present

## 2022-10-17 DIAGNOSIS — L578 Other skin changes due to chronic exposure to nonionizing radiation: Secondary | ICD-10-CM | POA: Diagnosis not present

## 2022-10-17 DIAGNOSIS — D2271 Melanocytic nevi of right lower limb, including hip: Secondary | ICD-10-CM | POA: Diagnosis not present

## 2022-10-23 NOTE — Progress Notes (Signed)
DEXA shows osteopenia.  We will hold off Reclast.

## 2022-10-24 ENCOUNTER — Other Ambulatory Visit: Payer: Self-pay | Admitting: Podiatry

## 2022-10-24 ENCOUNTER — Encounter: Payer: Self-pay | Admitting: Podiatry

## 2022-10-24 DIAGNOSIS — G5761 Lesion of plantar nerve, right lower limb: Secondary | ICD-10-CM | POA: Diagnosis not present

## 2022-10-24 DIAGNOSIS — M2021 Hallux rigidus, right foot: Secondary | ICD-10-CM | POA: Diagnosis not present

## 2022-10-24 DIAGNOSIS — G8918 Other acute postprocedural pain: Secondary | ICD-10-CM | POA: Diagnosis not present

## 2022-10-24 DIAGNOSIS — M205X1 Other deformities of toe(s) (acquired), right foot: Secondary | ICD-10-CM | POA: Diagnosis not present

## 2022-10-24 DIAGNOSIS — M19071 Primary osteoarthritis, right ankle and foot: Secondary | ICD-10-CM | POA: Diagnosis not present

## 2022-10-24 HISTORY — PX: TOE FUSION: SHX1070

## 2022-10-24 MED ORDER — OXYCODONE-ACETAMINOPHEN 5-325 MG PO TABS: 1.0000 | ORAL_TABLET | Freq: Four times a day (QID) | ORAL | 0 refills | Status: DC | PRN

## 2022-10-24 MED ORDER — PROMETHAZINE HCL 25 MG PO TABS: 25.0000 mg | ORAL_TABLET | Freq: Three times a day (TID) | ORAL | 0 refills | Status: DC | PRN

## 2022-10-24 MED ORDER — CEPHALEXIN 500 MG PO CAPS: 500.0000 mg | ORAL_CAPSULE | Freq: Three times a day (TID) | ORAL | 0 refills | Status: DC

## 2022-10-29 ENCOUNTER — Ambulatory Visit (INDEPENDENT_AMBULATORY_CARE_PROVIDER_SITE_OTHER): Payer: Medicare Other | Admitting: Podiatry

## 2022-10-29 ENCOUNTER — Ambulatory Visit (INDEPENDENT_AMBULATORY_CARE_PROVIDER_SITE_OTHER): Payer: Medicare Other

## 2022-10-29 DIAGNOSIS — Z9889 Other specified postprocedural states: Secondary | ICD-10-CM

## 2022-10-29 DIAGNOSIS — G5761 Lesion of plantar nerve, right lower limb: Secondary | ICD-10-CM

## 2022-10-29 DIAGNOSIS — M19071 Primary osteoarthritis, right ankle and foot: Secondary | ICD-10-CM

## 2022-10-29 MED ORDER — CEPHALEXIN 500 MG PO CAPS: 500.0000 mg | ORAL_CAPSULE | Freq: Three times a day (TID) | ORAL | 0 refills | Status: DC

## 2022-10-29 NOTE — Progress Notes (Signed)
Subjective: Chief Complaint  Patient presents with   Routine Post Op    POV #1 DOS 10/24/2022 RT FOOT FUSION OF BIG TOE JOINT (1ST MPJ), EXCISION VS DECOMPRESSION OF NEUROMA 3RD INTERSPACE/DR Diona Peregoy PT    Brenda Gray is a 62 y.o. is seen today in office s/p right foot first MPJ arthrodesis, neuroma excision preformed on 10/24/2022.  She states the pain is controlled.  She took pain medicine yesterday but she is not yet today.  She does not report any fevers or chills.  She has no other concerns.    Objective: General: No acute distress, AAOx3  DP/PT pulses palpable 2/4, CRT < 3 sec to all digits.  Protective sensation intact. Motor function intact.  Right foot: Incision is well coapted without any evidence of dehiscence.  Sutures are intact.  There is localized erythema to both the incisions but there is no increased temperature there is no drainage or pus coming from the incision.  There is no fluctuance or crepitation.  There is no malodor.  The toes are rectus position.   No other open lesions or pre-ulcerative lesions.  No pain with calf compression, swelling, warmth, erythema.   Assessment and Plan:  Status post right foot surgery, localized erythema  -Treatment options discussed including all alternatives, risks, and complications X-rays obtained reviewed.  3 views of the foot were obtained.  Hardware intact.  Pelvis.  Appropriate K wire intact I discussed this with the patient today to make them aware of this. -Incision appears to be doing well.  There is localized erythema which I think is more from inflammation but will continue antibiotics.  Monitor for any signs or symptoms of infection -Continue cam boot, nonweightbearing -Ice/elevation -Pain medication as needed. -Monitor for any clinical signs or symptoms of infection and DVT/PE and directed to call the office immediately should any occur or go to the ER. -Follow-up as scheduled or sooner if any problems arise. In the  meantime, encouraged to call the office with any questions, concerns, change in symptoms.   Brenda Gray, DPM

## 2022-11-05 ENCOUNTER — Other Ambulatory Visit: Payer: Self-pay

## 2022-11-06 MED ORDER — OXYCODONE-ACETAMINOPHEN 5-325 MG PO TABS: 1.0000 | ORAL_TABLET | Freq: Four times a day (QID) | ORAL | 0 refills | Status: DC | PRN

## 2022-11-06 MED ORDER — PROMETHAZINE HCL 25 MG PO TABS: 25.0000 mg | ORAL_TABLET | Freq: Three times a day (TID) | ORAL | 0 refills | Status: AC | PRN

## 2022-11-08 ENCOUNTER — Encounter: Payer: Medicare Other | Admitting: Podiatry

## 2022-11-09 ENCOUNTER — Ambulatory Visit (INDEPENDENT_AMBULATORY_CARE_PROVIDER_SITE_OTHER): Payer: Medicare Other | Admitting: Podiatry

## 2022-11-09 DIAGNOSIS — G5761 Lesion of plantar nerve, right lower limb: Secondary | ICD-10-CM

## 2022-11-09 DIAGNOSIS — Z9889 Other specified postprocedural states: Secondary | ICD-10-CM | POA: Diagnosis not present

## 2022-11-09 NOTE — Progress Notes (Signed)
  Patient presents today for post op visit # 2, patient of Dr. Jacqualyn Posey.    POV #2 DOS 10/24/2022 RT FOOT FUSION OF BIG TOE JOINT (1ST MPJ), EXCISION VS DECOMPRESSION OF NEUROMA 3RD INTERSPACE   Sutures removed today without complication.  Incisions look good and no signs of infections. Patient denies nausea, vomiting, fever and chills. Advised patient that she can get the incision site wet at this time but do not scrub the area or soak the foot. Patient verbalized understanding.    Reviewed icing and elevation. Patient will follow up with Dr. Jacqualyn Posey in 2 weeks  for POV# 3.   --  Patient was seen by RN only, I was not in the office at the time of the appointment.  Trula Slade DPM

## 2022-11-22 ENCOUNTER — Ambulatory Visit (INDEPENDENT_AMBULATORY_CARE_PROVIDER_SITE_OTHER): Payer: Medicare Other

## 2022-11-22 ENCOUNTER — Ambulatory Visit (INDEPENDENT_AMBULATORY_CARE_PROVIDER_SITE_OTHER): Payer: Medicare Other | Admitting: Podiatry

## 2022-11-22 DIAGNOSIS — Z9889 Other specified postprocedural states: Secondary | ICD-10-CM | POA: Diagnosis not present

## 2022-11-22 DIAGNOSIS — G5761 Lesion of plantar nerve, right lower limb: Secondary | ICD-10-CM

## 2022-11-22 DIAGNOSIS — M19071 Primary osteoarthritis, right ankle and foot: Secondary | ICD-10-CM

## 2022-11-22 MED ORDER — CEPHALEXIN 500 MG PO CAPS: 500.0000 mg | ORAL_CAPSULE | Freq: Three times a day (TID) | ORAL | 0 refills | Status: DC

## 2022-11-22 NOTE — Progress Notes (Signed)
Subjective: Chief Complaint  Patient presents with   Post-op Follow-up    POV #3 DOS 10/24/2022 RT FOOT FUSION OF BIG TOE JOINT (1ST MPJ), EXCISION VS DECOMPRESSION OF NEUROMA 3RD INTERSPACE, PATIENT STATED SHE IS DOING FAIRLY WELL, CONTINUED TREATMENT INVOLVES ELEVATION OF THE FOOT AND ICE BATHS      Brenda Gray is a 62 y.o. is seen today in office s/p right foot first MPJ arthrodesis, neuroma excision preformed on 10/24/2022.  States that she is feeling well.  No significant pain.  She states the pain and burning that she is having prior to surgery has resolved.  She has been keeping antibiotic ointment on the wounds.  Denies any drainage or pus.  No fevers or chills.  No other concerns.   Objective: General: No acute distress, AAOx3  DP/PT pulses palpable 2/4, CRT < 3 sec to all digits.  Protective sensation intact. Motor function intact.  Right foot: Incisions appear to be healing.  There is some mild surrounding erythema along the incisions and there is some superficial dehiscence noted along the first major arthrodesis site distally.  Fibrotic tissue present.  There is no drainage or pus.  There is no fluctuance or crepitation.  There is no malodor.  No ascending cellulitis. No other open lesions or pre-ulcerative lesions.  No pain with calf compression, swelling, warmth, erythema.   Assessment and Plan:  Status post right foot surgery, localized erythema  -Treatment options discussed including all alternatives, risks, and complications X-rays obtained reviewed.  3 views of the foot were obtained. Radiolucent line still noted.  Hardware intact.  Complicated by this..  Appropriate K wire intact I discussed this with the patient today to make them aware of this. -Given the mild erythema will start Keflex.  Betadine was applied followed by dressing.  Continue daily dressing changes with antibiotic ointment. -Can transition to partial weightbearing with family. -Continue cam  boot -Ice/elevation -Pain medication as needed. -Monitor for any clinical signs or symptoms of infection and DVT/PE and directed to call the office immediately should any occur or go to the ER. -Follow-up as scheduled or sooner if any problems arise. In the meantime, encouraged to call the office with any questions, concerns, change in symptoms.   X-ray next appointment  Celesta Gentile, DPM

## 2022-12-05 ENCOUNTER — Ambulatory Visit: Payer: Self-pay | Admitting: Physician Assistant

## 2022-12-05 NOTE — Progress Notes (Signed)
Office Visit Note  Patient: Brenda Gray             Date of Birth: May 09, 1961           MRN: [id number]             PCP: Garlan Fillers, MD Referring: Garlan Fillers, MD Visit Date: 12/06/2022 Occupation: @GUAROCC @  Subjective:  Left shoulder pain  History of Present Illness: Brenda Gray is a 62 y.o. female with history of osteoarthritis, DDD, and osteoporosis.  Patient reports that she underwent surgery on 10/24/2022 for her right foot fusion of the first MPJ and decompression of neuroma third interspace performed by Dr. Ardelle Anton.  She states that she was recently having to use crutches as well as a scooter which exacerbated her left shoulder pain.  She had a left subacromial bursa cortisone injection 07/27/2022 which righted temporary relief but her symptoms have returned.  She requested a repeat cortisone injection today.  Patient reports that she has had some increased crepitus and discomfort in her knees and would like to reapply for Visco gel injections this summer.  She denies any warmth or swelling in her knee joints at this time.  She has intermittent stiffness in her hands but denies any joint swelling.  She tries to perform hand exercises. She is taking a calcium and vitamin D supplement daily.   Activities of Daily Living:  Patient reports morning stiffness for 15-20 minutes.   Patient Reports nocturnal pain.  Difficulty dressing/grooming: Denies Difficulty climbing stairs: Denies Difficulty getting out of chair: Denies Difficulty using hands for taps, buttons, cutlery, and/or writing: Reports  Review of Systems  Constitutional:  Negative for fatigue.  HENT:  Negative for mouth sores and mouth dryness.   Eyes:  Negative for dryness.  Respiratory:  Negative for shortness of breath.   Cardiovascular:  Negative for chest pain and palpitations.  Gastrointestinal:  Negative for blood in stool, constipation and diarrhea.  Endocrine: Negative for increased  urination.  Genitourinary:  Negative for involuntary urination.  Musculoskeletal:  Positive for joint pain, joint pain and morning stiffness. Negative for gait problem, joint swelling, myalgias, muscle weakness, muscle tenderness and myalgias.  Skin:  Negative for color change, rash, hair loss and sensitivity to sunlight.  Allergic/Immunologic: Negative for susceptible to infections.  Neurological:  Negative for dizziness and headaches.  Hematological:  Negative for swollen glands.  Psychiatric/Behavioral:  Negative for depressed mood and sleep disturbance. The patient is not nervous/anxious.     PMFS History:  Patient Active Problem List   Diagnosis Date Noted   Atypical chest pain 06/04/2017   S/P rotator cuff repair 11/04/2016   Dyslipidemia 11/04/2016   History of peptic ulcer disease 11/04/2016   Interstitial cystitis 11/04/2016   DJD (degenerative joint disease), cervical 10/29/2016   Spondylosis of lumbar region without myelopathy or radiculopathy 10/29/2016   Primary osteoarthritis of both knees 10/29/2016   DDD (degenerative disc disease), thoracic 10/29/2016   Primary insomnia 10/29/2016   History of recurrent cystitis 10/29/2016   Primary osteoarthritis of both hands 10/29/2016   Morton neuroma, right 10/15/2016   DYSPHAGIA UNSPECIFIED 07/31/2010   ABDOMINAL PAIN, EPIGASTRIC 07/31/2010    Past Medical History:  Diagnosis Date   Arthritis    Atypical chest pain 06/04/2017   Cataract    bilateral-removed 2 yrs ago 06/25/19   Chronic headache S/P CERVICAL FUSION'S   Cystitis, interstitial    frequency/urgency/nocturia   DDD (degenerative disc disease), cervical  Dry eyes    Frequency of urination    H. pylori infection    H/O: upper GI bleed    Hyperlipidemia    Mild acid reflux OCCASIONAL--  WATCHES DIET   Nocturia    Numbness s/p cerival fusion   right side pain and numbness    Osteopenia    Pulmonary nodule noted in 2010   right upper lobe -- stable  (monitored by pcp)   Status post dilation of esophageal narrowing 2011   Urge incontinence of urine     Family History  Problem Relation Age of Onset   Hypertension Mother    Heart failure Mother    Colon polyps Mother    Other Father        had to have esogasus dilated   Heart disease Brother    Stroke Maternal Grandmother    Hypertension Brother    Heart disease Brother    Healthy Son    Esophageal cancer Neg Hx    Colon cancer Neg Hx    Rectal cancer Neg Hx    Stomach cancer Neg Hx    Past Surgical History:  Procedure Laterality Date   CATARACT EXTRACTION, BILATERAL  07/2017   with lens implants   CERVICAL DISKECTOMY AND FUSION  05/14/2002   C5 - 7   COLONOSCOPY     CYSTO WITH HYDRODISTENSION  07/19/2011   Procedure: CYSTOSCOPY/HYDRODISTENSION;  Surgeon: Anner Crete;  Location: [facility];  Service: Urology;  Laterality: N/A;  CYSTOSCOPY WITH HYDORDISTENTION OF BLADDER AND INSTILLATION OF MARCAINE AND PYRIDIUM   cysto/ hod  03/10/2009   [facility]   CYSTOSCOPY N/A 05/01/2013   Procedure: CYSTOSCOPY;  Surgeon: Martina Sinner, MD;  Location[facility];  Service: Urology;  Laterality: N/A;   CYSTOSCOPY WITH INJECTION  06/17/2012   Procedure: CYSTOSCOPY WITH INJECTION;  Surgeon: Martina Sinner, MD;  Location: [facility];  Service: Urology;  Laterality: N/A;  BOTOX   CYSTOSCOPY WITH INJECTION N/A 12/08/2012   Procedure: CYSTOSCOPY WITH BOTOX INJECTION;  Surgeon: Martina Sinner, MD;  Location: [facility];  Service: Urology;  Laterality: N/A;   EXCISION NEUROMA Right    laparoscopy ovarian cystectomy  2000 (APPROX)   left rotator cuff repair  06/2008   POSTERIOR FUSION CERVICAL SPINE  06/29/2009   C4 - 5   PUBOVAGINAL SLING N/A 05/01/2013   Procedure:  Jessica Priest;  Surgeon: Martina Sinner, MD;  Location: [facility];  Service: Urology;  Laterality: N/A;   ROTATOR CUFF REPAIR  2001    RIGHT SHOULDER   TOE FUSION Right    First digit   TONSILLECTOMY  1981   VAGINAL HYSTERECTOMY  1992   Social History   Social History Narrative   Not on file   Immunization History  Administered Date(s) Administered   PFIZER(Purple Top)SARS-COV-2 Vaccination 11/27/2019, 12/18/2019, 06/24/2020, 01/12/2021   Tdap 02/01/2019     Objective: Vital Signs: BP 117/69 (BP Location: Left Arm, Patient Position: Sitting, Cuff Size: Normal)   Pulse 69   Resp 12   Ht 5\' 4"  (1.626 m)   Wt 133 lb (60.3 kg)   BMI 22.83 kg/m    Physical Exam Vitals and nursing note reviewed.  Constitutional:      Appearance: She is well-developed.  HENT:     Head: Normocephalic and atraumatic.  Eyes:     Conjunctiva/sclera: Conjunctivae normal.  Cardiovascular:     Rate and Rhythm: Normal  rate and regular rhythm.     Heart sounds: Normal heart sounds.  Pulmonary:     Effort: Pulmonary effort is normal.     Breath sounds: Normal breath sounds.  Abdominal:     General: Bowel sounds are normal.     Palpations: Abdomen is soft.  Musculoskeletal:     Cervical back: Normal range of motion.  Lymphadenopathy:     Cervical: No cervical adenopathy.  Skin:    General: Skin is warm and dry.     Capillary Refill: Capillary refill takes less than 2 seconds.  Neurological:     Mental Status: She is alert and oriented to person, place, and time.  Psychiatric:        Behavior: Behavior normal.      Musculoskeletal Exam: C-spine has limited range of motion.  Left shoulder has painful range of motion.  Right shoulder has good ROM with no discomfort.  Elbow joints, wrist joints, MCPs, PIPs, and DIPs good ROM with no synovitis.  Complete fist formation bilaterally.  PIP and DIP thickening consistent with osteoarthritis of both hands.  Hip joints have good ROM with some stiffness in the right hip.  Knee joints have good ROM with no warmth or effusion.  Right foot in a cam walking boot.  Left ankle has good ROM with  no tenderness or joint swelling.   CDAI Exam: CDAI Score: -- Patient Global: --; Provider Global: -- Swollen: --; Tender: -- Joint Exam 12/06/2022   No joint exam has been documented for this visit   There is currently no information documented on the homunculus. Go to the Rheumatology activity and complete the homunculus joint exam.  Investigation: No additional findings.  Imaging: No results found.  Recent Labs: Lab Results  Component Value Date   WBC 5.5 07/31/2021   HGB 15.0 07/31/2021   PLT 248 07/31/2021   NA 142 07/31/2021   K 5.2 07/31/2021   CL 104 07/31/2021   CO2 30 07/31/2021   GLUCOSE 91 07/31/2021   BUN 16 07/31/2021   CREATININE 0.81 07/31/2021   BILITOT 0.6 07/31/2021   ALKPHOS 65 11/05/2016   AST 30 07/31/2021   ALT 28 07/31/2021   PROT 7.4 07/31/2021   ALBUMIN 4.3 11/05/2016   CALCIUM 10.1 07/31/2021   GFRAA 98 12/17/2017    Speciality Comments: Dexa: 09/08/18 Osteopenia T-Score -2.2 BMD 0.800 Hold reclast  Procedures:  Large Joint Inj: L subacromial bursa on 12/06/2022 9:49 AM Indications: pain Details: 27 G 1.5 in needle, posterior approach  Arthrogram: No  Medications: 1 mL lidocaine 1 %; 40 mg triamcinolone acetonide 40 MG/ML Aspirate: 0 mL Outcome: tolerated well, no immediate complications Procedure, treatment alternatives, risks and benefits explained, specific risks discussed. Consent was given by the patient. Immediately prior to procedure a time out was called to verify the correct patient, procedure, equipment, support staff and site/side marked as required. Patient was prepped and draped in the usual sterile fashion.     Allergies: Cymbalta [duloxetine hcl] and Lyrica [pregabalin]   Assessment / Plan:     Visit Diagnoses: Chronic left shoulder pain: Patient presents today with increased discomfort in the left shoulder.  She had a left shoulder injection on 07/27/2022 which provided temporary relief.  She recently had to use  crutches after her right foot surgery which she feels exacerbated her symptoms.  On examination she has painful range of motion with internal rotation and full abduction.  Patient requested a left subacromial cortisone injection today.  She  has had better relief with subacromial bursa glenohumeral joint injections in the past.  She tolerated procedure well.  Procedure note was completed above.  Aftercare was discussed.  She was advised to notify us if her symptoms persist or worsen.  S/P rotator cuff repair - Bilateral- Performed by Dr. August Saucer.   Primary osteoarthritis of both knees -She underwent Visco gel injections in May 2022 which have provided significant relief.  She has noticed some increased crepitus and soreness in her knees with exercise but overall she is found these injections to be effective at managing her symptoms.  She will likely like to reapply for Visco gel injections for both knees in summer 2024.  She will notify us when she would like [place] to start the application process.  X-rays of both knees were updated today to assess for radiographic progression.  Plan: XR KNEE 3 VIEW RIGHT, XR KNEE 3 VIEW LEFT  This patient is diagnosed with osteoarthritis of the knee(s).    Radiographs show evidence of joint space narrowing, osteophytes, subchondral sclerosis and/or subchondral cysts.  This patient has knee pain which interferes with functional and activities of daily living.    This patient has experienced inadequate response, adverse effects and/or intolerance with conservative treatments such as acetaminophen, NSAIDS, topical creams, physical therapy or regular exercise, knee bracing and/or weight loss.   This patient has experienced inadequate response or has a contraindication to intra articular steroid injections for at least 3 months.   This patient is not scheduled to have a total knee replacement within 6 months of starting treatment with viscosupplementation.  Primary  osteoarthritis of both hands: PIP and DIP thickening consistent with osteoarthritis of both hands.  She experiences intermittent discomfort and stiffness in both hands but has no active inflammation at this time.  Trochanteric bursitis of both hips: Intermittent pain at night.  No tenderness upon palpation today.  DDD (degenerative disc disease), cervical: Limited ROM with lateral rotation.  She performs neck stretching exercises daily.   DDD (degenerative disc disease), thoracic: No midline spinal tenderness.   DDD (degenerative disc disease), lumbar - Epidural steroid injection performed by Dr. Alvester Morin on June 20, 2022 and a radiofrequency ablation performed on 06/22/20.  Primary osteoarthritis of both feet - Under care of Dr. Hadley Pen right great toe fusion and decompression of neuroma third interspace performed by Dr. Ardelle Anton on 10/24/2022. Remains in cam walking boot.  Improving.    Age-related osteoporosis without current pathological fracture -Previous DEXA 10/06/2020 T-score:-2.1, BMD 0.821, AP Spine. Previous IV Reclast infusions 2018, 2019, and 08/18/2021. DEXA updated on 10/11/22: AP spine T-score -1.7. Dr. Corliss Skains recommended holding off on restarting Reclast--continue drug holiday.  Discussed the importance of calcium and vitamin D supplementation.  Also discussed the importance of resistive exercises.  She perform strength training and exercises regularly.  Vitamin D deficiency: She is taking a daily calcium and vitamin D supplement.   Other medical conditions are listed as follows:   History of peptic ulcer disease  Dyslipidemia  Interstitial cystitis  Orders: Orders Placed This Encounter  Procedures   Large Joint Inj: L subacromial bursa   XR KNEE 3 VIEW RIGHT   XR KNEE 3 VIEW LEFT   No orders of the defined types were placed in this encounter.     Follow-Up Instructions: Return in about 6 months (around 06/08/2023) for Osteoarthritis, DDD.   Gearldine Bienenstock, PA-C  Note - This record has been created using Dragon software.  Chart creation errors have  been sought, but may not always  have been located. Such creation errors do not reflect on  the standard of medical care.

## 2022-12-06 ENCOUNTER — Ambulatory Visit (INDEPENDENT_AMBULATORY_CARE_PROVIDER_SITE_OTHER): Payer: Medicare Other | Admitting: Podiatry

## 2022-12-06 ENCOUNTER — Ambulatory Visit (INDEPENDENT_AMBULATORY_CARE_PROVIDER_SITE_OTHER): Payer: Medicare Other

## 2022-12-06 ENCOUNTER — Encounter: Payer: Self-pay | Admitting: Physician Assistant

## 2022-12-06 ENCOUNTER — Ambulatory Visit: Payer: Medicare Other | Attending: Physician Assistant | Admitting: Physician Assistant

## 2022-12-06 ENCOUNTER — Ambulatory Visit: Payer: Medicare Other

## 2022-12-06 VITALS — BP 117/69 | HR 69 | Resp 12 | Ht 64.0 in | Wt 133.0 lb

## 2022-12-06 DIAGNOSIS — M170 Bilateral primary osteoarthritis of knee: Secondary | ICD-10-CM

## 2022-12-06 DIAGNOSIS — M5136 Other intervertebral disc degeneration, lumbar region: Secondary | ICD-10-CM

## 2022-12-06 DIAGNOSIS — Z9889 Other specified postprocedural states: Secondary | ICD-10-CM

## 2022-12-06 DIAGNOSIS — M7061 Trochanteric bursitis, right hip: Secondary | ICD-10-CM

## 2022-12-06 DIAGNOSIS — N3010 Interstitial cystitis (chronic) without hematuria: Secondary | ICD-10-CM

## 2022-12-06 DIAGNOSIS — G8929 Other chronic pain: Secondary | ICD-10-CM | POA: Diagnosis not present

## 2022-12-06 DIAGNOSIS — M5030 Other cervical disc degeneration, unspecified cervical region: Secondary | ICD-10-CM

## 2022-12-06 DIAGNOSIS — E785 Hyperlipidemia, unspecified: Secondary | ICD-10-CM

## 2022-12-06 DIAGNOSIS — G5761 Lesion of plantar nerve, right lower limb: Secondary | ICD-10-CM

## 2022-12-06 DIAGNOSIS — E559 Vitamin D deficiency, unspecified: Secondary | ICD-10-CM

## 2022-12-06 DIAGNOSIS — M25512 Pain in left shoulder: Secondary | ICD-10-CM

## 2022-12-06 DIAGNOSIS — M19041 Primary osteoarthritis, right hand: Secondary | ICD-10-CM

## 2022-12-06 DIAGNOSIS — M19072 Primary osteoarthritis, left ankle and foot: Secondary | ICD-10-CM

## 2022-12-06 DIAGNOSIS — M7062 Trochanteric bursitis, left hip: Secondary | ICD-10-CM

## 2022-12-06 DIAGNOSIS — M810 Age-related osteoporosis without current pathological fracture: Secondary | ICD-10-CM

## 2022-12-06 DIAGNOSIS — Z8711 Personal history of peptic ulcer disease: Secondary | ICD-10-CM

## 2022-12-06 DIAGNOSIS — M19071 Primary osteoarthritis, right ankle and foot: Secondary | ICD-10-CM

## 2022-12-06 DIAGNOSIS — M19042 Primary osteoarthritis, left hand: Secondary | ICD-10-CM

## 2022-12-06 DIAGNOSIS — M5134 Other intervertebral disc degeneration, thoracic region: Secondary | ICD-10-CM

## 2022-12-06 MED ORDER — LIDOCAINE HCL 1 % IJ SOLN
1.0000 mL | INTRAMUSCULAR | Status: AC | PRN
  Administered 2022-12-06: 1 mL

## 2022-12-06 MED ORDER — TRIAMCINOLONE ACETONIDE 40 MG/ML IJ SUSP
40.0000 mg | INTRAMUSCULAR | Status: AC | PRN
  Administered 2022-12-06: 40 mg via INTRA_ARTICULAR

## 2022-12-06 NOTE — Progress Notes (Signed)
X-rays of the left knee are consistent with mild osteoarthritis and mild chondromalacia patella.  No radiographic progression was noted compared to 2021.

## 2022-12-06 NOTE — Progress Notes (Signed)
X-rays of the right knee are consistent with mild osteoarthritis and mild chondromalacia patella.  No radiographic progression was noted when compared to 2021.  She has some calcification in the posterior aspect of the knee.  Please notify the patient.

## 2022-12-09 NOTE — Progress Notes (Signed)
Subjective: Chief Complaint  Patient presents with   Post-op Follow-up    POV #4 DOS 10/24/2022 RT FOOT FUSION OF BIG TOE JOINT (1ST MPJ), EXCISION VS DECOMPRESSION OF NEUROMA 3RD INTERSPACE, PATIENT STATED THAT SHE IS DOING WELL, NO PAIN, DOES HAVE A CONCERN THAT THE SITE WHERE THE SURGERY WAS PERFORMED MAY STILL HAVE A STITCH, LOCATION IS TENDER      Brenda Gray is a 62 y.o. is seen today in office s/p right foot first MPJ arthrodesis, neuroma excision preformed on 10/24/2022.  States that she is feeling well.  No significant pain.  She is concerned may be a stitch still in place.  Denies any fevers or chills.   Objective: General: No acute distress, AAOx3  DP/PT pulses palpable 2/4, CRT < 3 sec to all digits.  Protective sensation intact. Motor function intact.  Right foot: Incisions appear to be healing.  There is no evidence of dehiscence noted today.  There is minimal edema.  There is no surrounding erythema, ascending cellulitis.  There is no fluctuance or crepitation.  There is several stitches on the medial aspect of the scar is formed there is no suture remaining. No pain with calf compression, swelling, warmth, erythema.   Assessment and Plan:  Status post right foot surgery, localized erythema  -Treatment options discussed including all alternatives, risks, and complications -X-rays obtained reviewed.  3 views of the foot were obtained. Radiolucent line still noted.  Hardware intact.  Increased consolidation.  Hardware intact. -Overall doing better.  No signs of infection.  Small antibiotic ointment and dressing. -Weight-bear as tolerated in cam boot.  -Ice/elevation -Pain medication as needed. -Monitor for any clinical signs or symptoms of infection and DVT/PE and directed to call the office immediately should any occur or go to the ER. -Follow-up as scheduled or sooner if any problems arise. In the meantime, encouraged to call the office with any questions, concerns,  change in symptoms.   X-ray next appointment  Celesta Gentile, DPM

## 2022-12-20 ENCOUNTER — Ambulatory Visit (INDEPENDENT_AMBULATORY_CARE_PROVIDER_SITE_OTHER): Payer: Medicare Other | Admitting: Podiatry

## 2022-12-20 ENCOUNTER — Ambulatory Visit (INDEPENDENT_AMBULATORY_CARE_PROVIDER_SITE_OTHER): Payer: Medicare Other

## 2022-12-20 ENCOUNTER — Encounter: Payer: Medicare Other | Admitting: Podiatry

## 2022-12-20 VITALS — BP 129/68 | HR 73 | Temp 98.0°F

## 2022-12-20 DIAGNOSIS — M19071 Primary osteoarthritis, right ankle and foot: Secondary | ICD-10-CM

## 2022-12-20 DIAGNOSIS — R609 Edema, unspecified: Secondary | ICD-10-CM

## 2022-12-20 DIAGNOSIS — D3610 Benign neoplasm of peripheral nerves and autonomic nervous system, unspecified: Secondary | ICD-10-CM

## 2022-12-20 NOTE — Progress Notes (Signed)
Subjective: Chief Complaint  Patient presents with   Routine Post Op    POV #5 DOS 10/24/2022 RT FOOT FUSION OF BIG TOE JOINT (1ST MPJ), EXCISION VS DECOMPRESSION OF NEUROMA 3RD INTERSPACE. Over all patient doesn't have any concern. Patient stopped wearing her boot a week ago. No pain in foot in regular tennis shoes and also barefoot. Patient states that her  foot is still numb on the forefoot area.     Brenda Gray is a 62 y.o. is seen today in office s/p right foot first MPJ arthrodesis, neuroma excision preformed on 10/24/2022. She states that she stopped wearing the boot about a week ago and is back to regular shoe it is feeling better.  She denies being any pain. The area where she was concerned that there was a stitch is no longer causing any pain.  No redness.  No other concerns..  Denies any fevers or chills.   Objective: General: No acute distress, AAOx3  DP/PT pulses palpable 2/4, CRT < 3 sec to all digits.  Protective sensation intact. Motor function intact.  Right foot: Incisions appear to be healing.  Scar is well-formed.  There is no tenderness palpation at surgical site.  There is minimal edema.  There is no erythema or warmth.  Arthrodesis site appears to be stable.  No pain on the area of the  or neuroma. No pain with calf compression, swelling, warmth, erythema.   Assessment and Plan:  Status post right foot surgery, localized erythema  -Treatment options discussed including all alternatives, risks, and complications -X-rays obtained reviewed.  3 views of the foot were obtained. Radiolucent line still noted.  Hardware intact.  Hardware intact any complicating factors -She has transitioned herself to regular shoe without any pain.  Discussed although she is back into restrictions and limited activity to help continue to allow this to heal.  Compression anklet dispensed.  Continue ice, elevate as well as compression of any residual edema.  Return for pos-op visit 4-6  weeks, x-ray.  Brenda Gray DPM

## 2023-01-07 ENCOUNTER — Other Ambulatory Visit: Payer: Self-pay | Admitting: Podiatry

## 2023-01-07 DIAGNOSIS — R609 Edema, unspecified: Secondary | ICD-10-CM

## 2023-01-07 DIAGNOSIS — D3610 Benign neoplasm of peripheral nerves and autonomic nervous system, unspecified: Secondary | ICD-10-CM

## 2023-01-07 DIAGNOSIS — M19071 Primary osteoarthritis, right ankle and foot: Secondary | ICD-10-CM

## 2023-01-17 ENCOUNTER — Ambulatory Visit (INDEPENDENT_AMBULATORY_CARE_PROVIDER_SITE_OTHER): Payer: Medicare Other

## 2023-01-17 ENCOUNTER — Ambulatory Visit (INDEPENDENT_AMBULATORY_CARE_PROVIDER_SITE_OTHER): Payer: Medicare Other | Admitting: Podiatry

## 2023-01-17 DIAGNOSIS — Z9889 Other specified postprocedural states: Secondary | ICD-10-CM

## 2023-01-17 DIAGNOSIS — D3610 Benign neoplasm of peripheral nerves and autonomic nervous system, unspecified: Secondary | ICD-10-CM

## 2023-01-17 DIAGNOSIS — M19079 Primary osteoarthritis, unspecified ankle and foot: Secondary | ICD-10-CM

## 2023-01-22 NOTE — Progress Notes (Signed)
Subjective: Chief Complaint  Patient presents with   Routine Post Op    POV #6 DOS 10/24/2022 RT FOOT FUSION OF BIG TOE JOINT (1ST MPJ), EXCISION VS DECOMPRESSION OF NEUROMA 3RD INTERSPACE    Brenda Gray is a 62 y.o. is seen today in office s/p right foot first MPJ arthrodesis, neuroma excision preformed on 10/24/2022.  She says overall she is feeling great and she is back to wearing a regular shoe.  She occasional minimal swelling at the end of the day but otherwise she has been doing well.  She is happy with the outcome.  No recent injury or changes.  No other concerns.    Objective: General: No acute distress, AAOx3  DP/PT pulses palpable 2/4, CRT < 3 sec to all digits.  Protective sensation intact. Motor function intact.  Right foot: Incisions appear to be healing.  Scar has well-formed.  There is no tenderness palpation at surgical site.  There is trace residual edema.  There is no erythema or warmth.  Arthrodesis site appears to be stable.  No pain on the area of the  or neuroma. No pain with calf compression, swelling, warmth, erythema.   Assessment and Plan:  Status post right foot surgery, localized erythema  -Treatment options discussed including all alternatives, risks, and complications -X-rays obtained reviewed.  3 views of the foot were obtained. Radiolucent line still noted to have some increased consolidation noted.  Hardware is intact. -Clinically she is doing great not having any pain.  She is back to regular shoe.  Discussed gradual increase activity as tolerated.  Continue ice, elevate as well as compression to the residual edema.  I would like for the x-rays to have some more consolidation which we discussed this and she has not had any pain provide hold off on further treatment at this time and allow her to continue to heal.  Return in about 2 months (around 03/19/2023).  X-ray next appointment  Vivi Barrack DPM

## 2023-01-30 DIAGNOSIS — K080 Exfoliation of teeth due to systemic causes: Secondary | ICD-10-CM | POA: Diagnosis not present

## 2023-01-31 ENCOUNTER — Ambulatory Visit: Payer: Medicare Other | Admitting: Rheumatology

## 2023-02-07 DIAGNOSIS — K080 Exfoliation of teeth due to systemic causes: Secondary | ICD-10-CM | POA: Diagnosis not present

## 2023-02-12 DIAGNOSIS — K080 Exfoliation of teeth due to systemic causes: Secondary | ICD-10-CM | POA: Diagnosis not present

## 2023-02-15 DIAGNOSIS — Z01419 Encounter for gynecological examination (general) (routine) without abnormal findings: Secondary | ICD-10-CM | POA: Diagnosis not present

## 2023-03-19 ENCOUNTER — Ambulatory Visit: Payer: Medicare Other | Admitting: Podiatry

## 2023-04-01 ENCOUNTER — Other Ambulatory Visit: Payer: Self-pay | Admitting: Rheumatology

## 2023-04-01 ENCOUNTER — Other Ambulatory Visit: Payer: Self-pay | Admitting: Gastroenterology

## 2023-04-01 NOTE — Telephone Encounter (Signed)
Last Fill: 09/27/2022  Next Visit: 06/07/2023  Last Visit: 12/06/2022  Dx: Chronic left shoulder pain  Current Dose per office note on  12/06/2022: not mentioned  Okay to refill Tizanidine?

## 2023-05-01 DIAGNOSIS — K080 Exfoliation of teeth due to systemic causes: Secondary | ICD-10-CM | POA: Diagnosis not present

## 2023-05-24 NOTE — Progress Notes (Signed)
Office Visit Note  Patient: Brenda Gray             Date of Birth: 1961-05-20           MRN: [id number]             PCP: Garlan Fillers, MD Referring: Garlan Fillers, MD Visit Date: 06/07/2023 Occupation: @GUAROCC @  Subjective:  Left shoulder pain  History of Present Illness: Brenda Gray is a 62 y.o. female with osteoarthritis, degenerative disc disease and osteoporosis.  She states for the last 3 months she has been having pain and discomfort in her left shoulder.  She had good response to cortisone injections in the past.  She would like to have repeat cortisone injection.  She is having nocturnal pain especially when she sleeps on her side.  Her right shoulder is doing well.  She has not had much discomfort in her knee joint since her last viscosupplement injections in 2022.  She continues to have some stiffness and discomfort in her hands especially the DIP joints.  She has off-and-on discomfort in the trochanteric region.  She has been going to the gym on a regular basis and doing abductor exercises.  She has some stiffness in her neck lower back which is tolerable.  She has not had much discomfort in her feet.  She has been taking calcium and vitamin D.  She has been also working out on a regular basis.  She walks about 5 miles daily and also goes to the gym 4 days a week when she does the strength training and stretching.    Activities of Daily Living:  Patient reports morning stiffness for 30 minutes.   Patient Reports nocturnal pain.  Difficulty dressing/grooming: Denies Difficulty climbing stairs: Denies Difficulty getting out of chair: Denies Difficulty using hands for taps, buttons, cutlery, and/or writing: Reports  Review of Systems  Constitutional:  Negative for fatigue.  HENT:  Negative for mouth sores and mouth dryness.   Eyes:  Negative for dryness.  Respiratory:  Negative for shortness of breath.   Cardiovascular:  Negative for chest pain and  palpitations.  Gastrointestinal:  Negative for blood in stool, constipation and diarrhea.  Endocrine: Negative for increased urination.  Genitourinary:  Negative for involuntary urination.  Musculoskeletal:  Positive for joint pain, joint pain, joint swelling and morning stiffness. Negative for gait problem, myalgias, muscle weakness, muscle tenderness and myalgias.  Skin:  Positive for sensitivity to sunlight. Negative for color change, rash and hair loss.  Allergic/Immunologic: Negative for susceptible to infections.  Neurological:  Negative for dizziness and headaches.  Hematological:  Negative for swollen glands.  Psychiatric/Behavioral:  Negative for depressed mood and sleep disturbance. The patient is not nervous/anxious.     PMFS History:  Patient Active Problem List   Diagnosis Date Noted   Atypical chest pain 06/04/2017   S/P rotator cuff repair 11/04/2016   Dyslipidemia 11/04/2016   History of peptic ulcer disease 11/04/2016   Interstitial cystitis 11/04/2016   DJD (degenerative joint disease), cervical 10/29/2016   Spondylosis of lumbar region without myelopathy or radiculopathy 10/29/2016   Primary osteoarthritis of both knees 10/29/2016   DDD (degenerative disc disease), thoracic 10/29/2016   Primary insomnia 10/29/2016   History of recurrent cystitis 10/29/2016   Primary osteoarthritis of both hands 10/29/2016   Morton neuroma, right 10/15/2016   DYSPHAGIA UNSPECIFIED 07/31/2010   ABDOMINAL PAIN, EPIGASTRIC 07/31/2010    Past Medical History:  Diagnosis Date   Arthritis  Atypical chest pain 06/04/2017   Cataract    bilateral-removed 2 yrs ago 06/25/19   Chronic headache S/P CERVICAL FUSION'S   Cystitis, interstitial    frequency/urgency/nocturia   DDD (degenerative disc disease), cervical    Dry eyes    Frequency of urination    H. pylori infection    H/O: upper GI bleed    Hyperlipidemia    Mild acid reflux OCCASIONAL--  WATCHES DIET   Nocturia     Numbness s/p cerival fusion   right side pain and numbness    Osteopenia    Pulmonary nodule noted in 2010   right upper lobe -- stable (monitored by pcp)   Status post dilation of esophageal narrowing 2011   Urge incontinence of urine     Family History  Problem Relation Age of Onset   Hypertension Mother    Heart failure Mother    Colon polyps Mother    Other Father        had to have esogasus dilated   Heart disease Brother    Stroke Maternal Grandmother    Hypertension Brother    Heart disease Brother    Healthy Son    Esophageal cancer Neg Hx    Colon cancer Neg Hx    Rectal cancer Neg Hx    Stomach cancer Neg Hx    Past Surgical History:  Procedure Laterality Date   CATARACT EXTRACTION, BILATERAL  07/2017   with lens implants   CERVICAL DISKECTOMY AND FUSION  05/14/2002   C5 - 7   COLONOSCOPY     CYSTO WITH HYDRODISTENSION  07/19/2011   Procedure: CYSTOSCOPY/HYDRODISTENSION;  Surgeon: Anner Crete;  Location: [place];  Service: Urology;  Laterality: N/A;  CYSTOSCOPY WITH HYDORDISTENTION OF BLADDER AND INSTILLATION OF MARCAINE AND PYRIDIUM   cysto/ hod  03/10/2009   [facility]   CYSTOSCOPY N/A 05/01/2013   Procedure: CYSTOSCOPY;  Surgeon: Martina Sinner, MD;  Location: [facility];  Service: Urology;  Laterality: N/A;   CYSTOSCOPY WITH INJECTION  06/17/2012   Procedure: CYSTOSCOPY WITH INJECTION;  Surgeon: Martina Sinner, MD;  Location: [facility] [place];  Service: Urology;  Laterality: N/A;  BOTOX   CYSTOSCOPY WITH INJECTION N/A 12/08/2012   Procedure: CYSTOSCOPY WITH BOTOX INJECTION;  Surgeon: Martina Sinner, MD;  Location: [facility] ;  Service: Urology;  Laterality: N/A;   EXCISION NEUROMA Right    laparoscopy ovarian cystectomy  2000 (APPROX)   left rotator cuff repair  06/2008   POSTERIOR FUSION CERVICAL SPINE  06/29/2009   C4 - 5   PUBOVAGINAL SLING N/A 05/01/2013   Procedure:  Jessica Priest;   Surgeon: Martina Sinner, MD;  Location: [facility];  Service: Urology;  Laterality: N/A;   ROTATOR CUFF REPAIR  2001   RIGHT SHOULDER   TOE FUSION Right 10/24/2022   First digit   TONSILLECTOMY  1981   VAGINAL HYSTERECTOMY  1992   Social History   Social History Narrative   Not on file   Immunization History  Administered Date(s) Administered   PFIZER(Purple Top)SARS-COV-2 Vaccination 11/27/2019, 12/18/2019, 06/24/2020, 01/12/2021   Tdap 02/01/2019     Objective: Vital Signs: BP 125/76 (BP Location: Left Arm, Patient Position: Sitting, Cuff Size: Normal)   Pulse 70   Resp 14   Ht 5\' 4"  (1.626 m)   Wt 135 lb (61.2 kg)   BMI 23.17 kg/m    Physical Exam Vitals and nursing note reviewed.  Constitutional:      Appearance: She is well-developed.  HENT:     Head: Normocephalic and atraumatic.  Eyes:     Conjunctiva/sclera: Conjunctivae normal.  Cardiovascular:     Rate and Rhythm: Normal rate and regular rhythm.     Heart sounds: Normal heart sounds.  Pulmonary:     Effort: Pulmonary effort is normal.     Breath sounds: Normal breath sounds.  Abdominal:     General: Bowel sounds are normal.     Palpations: Abdomen is soft.  Musculoskeletal:     Cervical back: Normal range of motion.  Lymphadenopathy:     Cervical: No cervical adenopathy.  Skin:    General: Skin is warm and dry.     Capillary Refill: Capillary refill takes less than 2 seconds.  Neurological:     Mental Status: She is alert and oriented to person, place, and time.  Psychiatric:        Behavior: Behavior normal.      Musculoskeletal Exam: Cervical spine was in good range of motion.  She had good mobility in the thoracic and lumbar spine without any discomfort.  She had painful abduction and internal rotation of the left shoulder joint.  Right shoulder was good range of motion.  She is surgical scar over bilateral shoulders.  Elbow joints, wrist joints, MCPs PIPs and DIPs with good  range of motion.  She had bilateral PIP and DIP thickening.  She had limited extension of PIP and DIP joints.  Hip joints and knee joints in good range of motion without any warmth swelling or effusion.  There was no tenderness over ankles or MTPs.  CDAI Exam: CDAI Score: -- Patient Global: --; Provider Global: -- Swollen: --; Tender: -- Joint Exam 06/07/2023   No joint exam has been documented for this visit   There is currently no information documented on the homunculus. Go to the Rheumatology activity and complete the homunculus joint exam.  Investigation: No additional findings.  Imaging: No results found.  Recent Labs: Lab Results  Component Value Date   WBC 5.5 07/31/2021   HGB 15.0 07/31/2021   PLT 248 07/31/2021   NA 142 07/31/2021   K 5.2 07/31/2021   CL 104 07/31/2021   CO2 30 07/31/2021   GLUCOSE 91 07/31/2021   BUN 16 07/31/2021   CREATININE 0.81 07/31/2021   BILITOT 0.6 07/31/2021   ALKPHOS 65 11/05/2016   AST 30 07/31/2021   ALT 28 07/31/2021   PROT 7.4 07/31/2021   ALBUMIN 4.3 11/05/2016   CALCIUM 10.1 07/31/2021   GFRAA 98 12/17/2017    Speciality Comments: Dexa: 10/11/2022 Osteopenia T-Score -1.7 BMD 0.865.  Previous reclast IV: 11/09/2016, 12/06/2017, 08/18/2021.   Procedures:  Large Joint Inj: L glenohumeral on 06/07/2023 8:24 AM Indications: pain Details: 27 G 1.5 in needle, posterior approach  Arthrogram: No  Medications: 1 mL lidocaine 1 %; 40 mg triamcinolone acetonide 40 MG/ML Aspirate: 0 mL Outcome: tolerated well, no immediate complications Procedure, treatment alternatives, risks and benefits explained, specific risks discussed. Consent was given by the patient. Immediately prior to procedure a time out was called to verify the correct patient, procedure, equipment, support staff and site/side marked as required. Patient was prepped and draped in the usual sterile fashion.     Allergies: Cymbalta [duloxetine hcl] and Lyrica [pregabalin]    Assessment / Plan:     Visit Diagnoses: Chronic left shoulder pain - s/p left subacromial cortisone injection on 12/06/2022.  Patient states the  pain has come back in her left shoulder and the pain is progressively getting worse over the last 3 months.  She had left rotator cuff tear repair in the past.  She had good response to the cortisone injection at the last visit.  She had painful abduction and internal rotation on the examination.  After informed consent was obtained and side effects were discussed left shoulder joint was injected with lidocaine and Kenalog as described above.  Patient tolerated the procedure well.  Postprocedure instructions were given.  She has a handout on exercises which she will continue to do.  S/P rotator cuff repair - Bilateral- Performed by Dr. August Saucer.  Primary osteoarthritis of both knees -she denies any discomfort in her knee joints today.  She underwent Visco gel injections in May 2022 which have provided significant relief.  Primary osteoarthritis of both hands-she is very active.  She does yard work and routine activities at home.  She had bilateral DIP and PIP thickening.  She has limited extension of her PIP and DIP joints.  Stretching exercises were demonstrated in the office.  Trochanteric bursitis of both hips-she can use to have some trochanteric bursa pain.  IT band stretches were advised.  Patient has been doing some stretching exercises at the gym.  Primary osteoarthritis of both feet -patient denies any discomfort in her feet.  She was under care of Dr. Hadley Pen right great toe fusion and decompression of neuroma third interspace performed by Dr. Ardelle Anton on 10/24/2022.  DDD (degenerative disc disease), cervical-she had good mobility in her cervical spine without any discomfort today.  DDD (degenerative disc disease), thoracic-she had no point tenderness over thoracic spine.  DDD (degenerative disc disease), lumbar -she was able to reach her  toes without any discomfort.  She has been doing core strengthening exercises.  She had epidural steroid injection performed by Dr. Alvester Morin on June 20, 2022 and a radiofrequency ablation performed on 06/22/20.  Age-related osteoporosis without current pathological fracture - October 11, 2022 DEXA scan T-score -1.7, BMD 0.865 AP spine, 5% change when compared to 2022. Previous reclast IV: 11/09/2016, 12/06/2017, 08/18/2021..  We will continue to hold Reclast.  Will recheck DEXA scan in 2026.  Patient has been taking calcium and vitamin D and has been exercising on a regular basis.  She walks 5 miles a day and also does workout at the gym with the strength training.  Vitamin D deficiency-vitamin D was normal in 2022.  History of peptic ulcer disease  Dyslipidemia  Interstitial cystitis  Orders: Orders Placed This Encounter  Procedures   Large Joint Inj: L glenohumeral   No orders of the defined types were placed in this encounter.    Follow-Up Instructions: Return in about 6 months (around 12/05/2023) for Osteoarthritis.   Pollyann Savoy, MD  Note - This record has been created using Animal nutritionist.  Chart creation errors have been sought, but may not always  have been located. Such creation errors do not reflect on  the standard of medical care.

## 2023-06-07 ENCOUNTER — Encounter: Payer: Self-pay | Admitting: Rheumatology

## 2023-06-07 ENCOUNTER — Ambulatory Visit: Payer: Medicare Other | Attending: Rheumatology | Admitting: Rheumatology

## 2023-06-07 VITALS — BP 125/76 | HR 70 | Resp 14 | Ht 64.0 in | Wt 135.0 lb

## 2023-06-07 DIAGNOSIS — Z9889 Other specified postprocedural states: Secondary | ICD-10-CM

## 2023-06-07 DIAGNOSIS — E785 Hyperlipidemia, unspecified: Secondary | ICD-10-CM

## 2023-06-07 DIAGNOSIS — M19071 Primary osteoarthritis, right ankle and foot: Secondary | ICD-10-CM

## 2023-06-07 DIAGNOSIS — G8929 Other chronic pain: Secondary | ICD-10-CM | POA: Diagnosis not present

## 2023-06-07 DIAGNOSIS — M7062 Trochanteric bursitis, left hip: Secondary | ICD-10-CM

## 2023-06-07 DIAGNOSIS — M19041 Primary osteoarthritis, right hand: Secondary | ICD-10-CM

## 2023-06-07 DIAGNOSIS — M25512 Pain in left shoulder: Secondary | ICD-10-CM

## 2023-06-07 DIAGNOSIS — N3010 Interstitial cystitis (chronic) without hematuria: Secondary | ICD-10-CM

## 2023-06-07 DIAGNOSIS — M19042 Primary osteoarthritis, left hand: Secondary | ICD-10-CM

## 2023-06-07 DIAGNOSIS — M170 Bilateral primary osteoarthritis of knee: Secondary | ICD-10-CM | POA: Diagnosis not present

## 2023-06-07 DIAGNOSIS — M5136 Other intervertebral disc degeneration, lumbar region: Secondary | ICD-10-CM

## 2023-06-07 DIAGNOSIS — Z8711 Personal history of peptic ulcer disease: Secondary | ICD-10-CM

## 2023-06-07 DIAGNOSIS — M7061 Trochanteric bursitis, right hip: Secondary | ICD-10-CM

## 2023-06-07 DIAGNOSIS — M810 Age-related osteoporosis without current pathological fracture: Secondary | ICD-10-CM

## 2023-06-07 DIAGNOSIS — E559 Vitamin D deficiency, unspecified: Secondary | ICD-10-CM

## 2023-06-07 DIAGNOSIS — M5030 Other cervical disc degeneration, unspecified cervical region: Secondary | ICD-10-CM

## 2023-06-07 DIAGNOSIS — M5134 Other intervertebral disc degeneration, thoracic region: Secondary | ICD-10-CM

## 2023-06-07 DIAGNOSIS — M19072 Primary osteoarthritis, left ankle and foot: Secondary | ICD-10-CM

## 2023-06-07 MED ORDER — TRIAMCINOLONE ACETONIDE 40 MG/ML IJ SUSP
40.0000 mg | INTRAMUSCULAR | Status: AC | PRN
Start: 2023-06-07 — End: 2023-06-07
  Administered 2023-06-07: 40 mg via INTRA_ARTICULAR

## 2023-06-07 MED ORDER — LIDOCAINE HCL 1 % IJ SOLN
1.0000 mL | INTRAMUSCULAR | Status: AC | PRN
Start: 2023-06-07 — End: 2023-06-07
  Administered 2023-06-07: 1 mL

## 2023-08-19 DIAGNOSIS — E785 Hyperlipidemia, unspecified: Secondary | ICD-10-CM | POA: Diagnosis not present

## 2023-08-19 DIAGNOSIS — Z1212 Encounter for screening for malignant neoplasm of rectum: Secondary | ICD-10-CM | POA: Diagnosis not present

## 2023-08-19 DIAGNOSIS — M810 Age-related osteoporosis without current pathological fracture: Secondary | ICD-10-CM | POA: Diagnosis not present

## 2023-08-19 DIAGNOSIS — E041 Nontoxic single thyroid nodule: Secondary | ICD-10-CM | POA: Diagnosis not present

## 2023-08-22 DIAGNOSIS — K080 Exfoliation of teeth due to systemic causes: Secondary | ICD-10-CM | POA: Diagnosis not present

## 2023-08-25 DIAGNOSIS — Z1212 Encounter for screening for malignant neoplasm of rectum: Secondary | ICD-10-CM | POA: Diagnosis not present

## 2023-08-26 DIAGNOSIS — Z1331 Encounter for screening for depression: Secondary | ICD-10-CM | POA: Diagnosis not present

## 2023-08-26 DIAGNOSIS — I10 Essential (primary) hypertension: Secondary | ICD-10-CM | POA: Diagnosis not present

## 2023-08-26 DIAGNOSIS — Z0000 Encounter for general adult medical examination without abnormal findings: Secondary | ICD-10-CM | POA: Diagnosis not present

## 2023-08-29 ENCOUNTER — Other Ambulatory Visit: Payer: Self-pay | Admitting: Internal Medicine

## 2023-08-29 DIAGNOSIS — Z87891 Personal history of nicotine dependence: Secondary | ICD-10-CM

## 2023-09-09 ENCOUNTER — Other Ambulatory Visit: Payer: Self-pay | Admitting: *Deleted

## 2023-09-09 MED ORDER — TIZANIDINE HCL 4 MG PO TABS: 4.0000 mg | ORAL_TABLET | Freq: Every day | ORAL | 2 refills | Status: DC

## 2023-09-09 NOTE — Telephone Encounter (Signed)
Refill request received via fax from [facility] [address]  for Tizanidine  Last Fill: 04/01/2023  Next Visit: 12/05/2023  Last Visit: 06/07/2023  Dx: Chronic left shoulder pain   Current Dose per office note on 06/07/2023: not discussed  Okay to refill Tizanidine?

## 2023-09-20 ENCOUNTER — Ambulatory Visit
Admission: RE | Admit: 2023-09-20 | Discharge: 2023-09-20 | Disposition: A | Discharge status: Home / Self Care | Payer: Medicare Other | Source: Ambulatory Visit | Attending: Internal Medicine | Admitting: Internal Medicine

## 2023-09-20 DIAGNOSIS — R911 Solitary pulmonary nodule: Secondary | ICD-10-CM | POA: Diagnosis not present

## 2023-09-20 DIAGNOSIS — Z87891 Personal history of nicotine dependence: Secondary | ICD-10-CM

## 2023-09-26 DIAGNOSIS — H04123 Dry eye syndrome of bilateral lacrimal glands: Secondary | ICD-10-CM | POA: Diagnosis not present

## 2023-09-26 DIAGNOSIS — Z961 Presence of intraocular lens: Secondary | ICD-10-CM | POA: Diagnosis not present

## 2023-10-17 ENCOUNTER — Telehealth: Payer: Self-pay | Admitting: Physical Medicine and Rehabilitation

## 2023-10-17 ENCOUNTER — Telehealth: Payer: Self-pay | Admitting: Rheumatology

## 2023-10-17 DIAGNOSIS — Z1231 Encounter for screening mammogram for malignant neoplasm of breast: Secondary | ICD-10-CM | POA: Diagnosis not present

## 2023-10-17 NOTE — Telephone Encounter (Signed)
 Pt called requesting an appt for injection

## 2023-10-17 NOTE — Telephone Encounter (Signed)
Ok to reapply for visco-supplementation for both knees.

## 2023-10-17 NOTE — Telephone Encounter (Signed)
 Please call to schedule visco injections.  Approved for Orthovisc, Bilateral knee(s). Buy & Bill $15 co-pay Deductible does not apply Once the OOP is met $2800 (Met $15) patient is covered at 100% Prior authorization is not required

## 2023-10-17 NOTE — Telephone Encounter (Signed)
 Pt would like to apply for gel injections.

## 2023-10-17 NOTE — Telephone Encounter (Signed)
 VOB submitted for Orthovisc, Bilateral knee(s) BV pending

## 2023-10-22 DIAGNOSIS — L821 Other seborrheic keratosis: Secondary | ICD-10-CM | POA: Diagnosis not present

## 2023-10-22 DIAGNOSIS — L578 Other skin changes due to chronic exposure to nonionizing radiation: Secondary | ICD-10-CM | POA: Diagnosis not present

## 2023-10-22 DIAGNOSIS — L720 Epidermal cyst: Secondary | ICD-10-CM | POA: Diagnosis not present

## 2023-10-22 DIAGNOSIS — B078 Other viral warts: Secondary | ICD-10-CM | POA: Diagnosis not present

## 2023-10-22 DIAGNOSIS — D225 Melanocytic nevi of trunk: Secondary | ICD-10-CM | POA: Diagnosis not present

## 2023-10-29 ENCOUNTER — Ambulatory Visit: Payer: Medicare Other | Admitting: Physical Medicine and Rehabilitation

## 2023-10-29 ENCOUNTER — Encounter: Payer: Self-pay | Admitting: Physical Medicine and Rehabilitation

## 2023-10-29 VITALS — BP 132/83 | HR 76

## 2023-10-29 DIAGNOSIS — M48061 Spinal stenosis, lumbar region without neurogenic claudication: Secondary | ICD-10-CM | POA: Diagnosis not present

## 2023-10-29 DIAGNOSIS — M5442 Lumbago with sciatica, left side: Secondary | ICD-10-CM | POA: Diagnosis not present

## 2023-10-29 DIAGNOSIS — M5441 Lumbago with sciatica, right side: Secondary | ICD-10-CM | POA: Diagnosis not present

## 2023-10-29 DIAGNOSIS — M5416 Radiculopathy, lumbar region: Secondary | ICD-10-CM

## 2023-10-29 DIAGNOSIS — G8929 Other chronic pain: Secondary | ICD-10-CM

## 2023-10-29 NOTE — Progress Notes (Signed)
 Pain Score--5 No reported injuries

## 2023-10-29 NOTE — Progress Notes (Signed)
 Brenda Gray - 63 y.o. female MRN [id number]  Date of birth: 09/03/1961  Office Visit Note: Visit Date: 10/29/2023 PCP: Garlan Fillers, MD Referred by: Garlan Fillers, MD  Subjective: Chief Complaint  Patient presents with   Lower Back - Pain   HPI: Brenda Gray is a 62 y.o. female who comes in today for evaluation of chronic, worsening and severe bilateral lower back pain radiating down posterior legs to feet. Pain ongoing for several years, worsened about 3 months ago. Her pain worsens with prolonged sitting and laying down, she describes her pain as sore and aching sensation, currently rates as 5 out of 10. Some relief of pain with home exercise regimen, rest and use of medications. She does take Tramadol as needed for moderate/severe pain that is prescribed by her primary care provider Dr. Jarome Matin. She reports taking Tramadol daily for the last several weeks due to severe pain. Patient has attended formal physical therapy in the past, no relief of pain with these treatments. Patient is very active, [facility]. Lumbar MRI imaging from 2023 exhibits progressive lumbar disc and facet degeneration since 2011, there is mild-to-moderate lateral recess stenosis at L3-L4 and L4-L5, mild foraminal stenosis on the left at L3-L4 and on the right at L5-S1. No high grade spinal canal stenosis noted. Patient has done well with intermittent bilateral S1 transforaminal epidural steroid injections in our office, most recent injection was 06/20/2022. Reports this procedure provided greater than 80% relief of pain for over 1 year. She also underwent bilateral L5-S1 radiofrequency in our office in 2021 and provided sustained relief of axial back pain. Patient denies focal weakness, numbness and tingling. No recent trauma or falls.      Review of Systems  Musculoskeletal:  Positive for back pain.  Neurological:  Negative for tingling, sensory change, focal  weakness and weakness.  All other systems reviewed and are negative.  Otherwise per HPI.  Assessment & Plan: Visit Diagnoses:    ICD-10-CM   1. Chronic bilateral low back pain with bilateral sciatica  M54.42 Ambulatory referral to Physical Medicine Rehab   M54.41    G89.29     2. Lumbar radiculopathy  M54.16 Ambulatory referral to Physical Medicine Rehab    3. Stenosis of lateral recess of lumbar spine  M48.061 Ambulatory referral to Physical Medicine Rehab    4. Foraminal stenosis of lumbar region  M48.061 Ambulatory referral to Physical Medicine Rehab       Plan: Findings:  Chronic, worsening and severe bilateral lower back pain radiating down posterior legs to feet. Patient continues to have severe pain despite good conservative therapies such as formal physical therapy, home exercise regimen, rest and use of medications. Patients clinical presentation and exam are complex, her symptoms do not directly correlate with recent lumbar MRI imaging. Her pain pattern does seem to be more of an S1 dermatome. She has done well with intermittent lumbar epidural steroid injections in the past. We discussed treatment plan in detail today. Next step is to perform bilateral S1 transforaminal epidural steroid injection under fluoroscopic guidance. If good relief of pain with injection we can repeat this procedure infrequently as needed. Patient has no questions at this time. If lower back pain persists post injection we did discuss possibility of repeating radiofrequency ablation.  No red flag symptoms noted upon exam today.   Screening for Osteoporosis for Women Aged 77-34 Years of Age:    Patient has had a central  dual-energy X-ray absorptiometry (DXA) to check for osteoporosis.   Today's note sent to PCP on record. Patient does not need referral to [facility] osteoporosis clinic.      Meds & Orders: No orders of the defined types were placed in this encounter.   Orders Placed This Encounter   Procedures   Ambulatory referral to Physical Medicine Rehab    Follow-up: Return for Bilateral S1 transforaminal epidural steroid injection.   Procedures: No procedures performed      Clinical History: CLINICAL DATA:  Lumbar spondylosis with radiculopathy. Lumbar facet arthropathy. Chronic low back pain radiating to both sides with numbness and weakness.   EXAM: MRI LUMBAR SPINE WITHOUT CONTRAST   TECHNIQUE: Multiplanar, multisequence MR imaging of the lumbar spine was performed. No intravenous contrast was administered.   COMPARISON:  Lumbar spine MRI 01/06/2010   FINDINGS: Segmentation:  Standard.   Alignment:  Mild lumbar dextroscoliosis.  No significant listhesis.   Vertebrae: No fracture, suspicious marrow lesion, or significant marrow edema. Small Schmorl's nodes in the lower thoracic spine. Asymmetric right-sided chronic degenerative endplate changes at L5-S1.   Conus medullaris and cauda equina: Conus extends to the lower L1 level. Conus and cauda equina appear normal.   Paraspinal and other soft tissues: Unremarkable.   Disc levels:   Disc desiccation throughout the lumbar spine. Mild disc space narrowing from L1-2 to L3-4 and at L5-S1. Moderate disc space narrowing at T12-L1.   T12-L1: Mild disc bulging and endplate spurring without stenosis, unchanged.   L1-2: Progressive disc degeneration. Disc bulging results in mild left lateral recess stenosis without spinal or neural foraminal stenosis.   L2-3: Progressive disc degeneration. Disc bulging and mild facet and ligamentum flavum hypertrophy result in mild left greater than right lateral recess stenosis without spinal or neural foraminal stenosis.   L3-4: Progressive disc degeneration. Disc bulging and mild facet and ligamentum flavum hypertrophy result in mild right and mild-to-moderate left lateral recess stenosis and mild left neural foraminal stenosis without significant spinal stenosis.    L4-5: Disc bulging and mild-to-moderate facet and ligamentum flavum hypertrophy result in mild-to-moderate bilateral lateral recess stenosis, mildly progressed from prior. No significant spinal or neural foraminal stenosis.   L5-S1: Progressive disc degeneration. Disc bulging and mild-to-moderate right and mild left facet and ligamentum flavum hypertrophy result in mild right neural foraminal stenosis without spinal stenosis.   IMPRESSION: 1. Progressive lumbar disc and facet degeneration since 2011. 2. Mild-to-moderate lateral recess stenosis at L3-4 and L4-5. 3. Mild neural foraminal stenosis on the left at L3-4 and on the right at L5-S1.     Electronically Signed   By: Sebastian Ache M.D.   On: 06/04/2022 10:29   She reports that she quit smoking about 17 years ago. Her smoking use included cigarettes. She started smoking about 37 years ago. She has a 2 pack-year smoking history. She has never been exposed to tobacco smoke. She has never used smokeless tobacco. No results for input(s): "HGBA1C", "LABURIC" in the last 8760 hours.  Objective:  VS:  HT:    WT:   BMI:     BP:132/83  HR:76bpm  TEMP: ( )  RESP:  Physical Exam Vitals and nursing note reviewed.  HENT:     Head: Normocephalic and atraumatic.     Right Ear: External ear normal.     Left Ear: External ear normal.     Nose: Nose normal.     Mouth/Throat:     Mouth: Mucous membranes are moist.  Eyes:  Extraocular Movements: Extraocular movements intact.  Cardiovascular:     Rate and Rhythm: Normal rate.     Pulses: Normal pulses.  Pulmonary:     Effort: Pulmonary effort is normal.  Abdominal:     General: Abdomen is flat. There is no distension.  Musculoskeletal:        General: Tenderness present.     Cervical back: Normal range of motion.     Comments: Patient rises from seated position to standing without difficulty. Good lumbar range of motion. No pain noted with facet loading. 5/5 strength noted with  bilateral hip flexion, knee flexion/extension, ankle dorsiflexion/plantarflexion and EHL. No clonus noted bilaterally. No pain upon palpation of greater trochanters. No pain with internal/external rotation of bilateral hips. Sensation intact bilaterally. Dysesthesias noted to bilateral S1 dermatomes. Negative slump test bilaterally. Ambulates without aid, gait steady.     Skin:    General: Skin is warm and dry.     Capillary Refill: Capillary refill takes less than 2 seconds.  Neurological:     General: No focal deficit present.     Mental Status: She is alert and oriented to person, place, and time.  Psychiatric:        Mood and Affect: Mood normal.        Behavior: Behavior normal.     Ortho Exam  Imaging: No results found.  Past Medical/Family/Surgical/Social History: Medications & Allergies reviewed per EMR, new medications updated. Patient Active Problem List   Diagnosis Date Noted   Atypical chest pain 06/04/2017   S/P rotator cuff repair 11/04/2016   Dyslipidemia 11/04/2016   History of peptic ulcer disease 11/04/2016   Interstitial cystitis 11/04/2016   DJD (degenerative joint disease), cervical 10/29/2016   Spondylosis of lumbar region without myelopathy or radiculopathy 10/29/2016   Primary osteoarthritis of both knees 10/29/2016   DDD (degenerative disc disease), thoracic 10/29/2016   Primary insomnia 10/29/2016   History of recurrent cystitis 10/29/2016   Primary osteoarthritis of both hands 10/29/2016   Morton neuroma, right 10/15/2016   DYSPHAGIA UNSPECIFIED 07/31/2010   ABDOMINAL PAIN, EPIGASTRIC 07/31/2010   Past Medical History:  Diagnosis Date   Arthritis    Atypical chest pain 06/04/2017   Cataract    bilateral-removed 2 yrs ago 06/25/19   Chronic headache S/P CERVICAL FUSION'S   Cystitis, interstitial    frequency/urgency/nocturia   DDD (degenerative disc disease), cervical    Dry eyes    Frequency of urination    H. pylori infection    H/O:  upper GI bleed    Hyperlipidemia    Mild acid reflux OCCASIONAL--  WATCHES DIET   Nocturia    Numbness s/p cerival fusion   right side pain and numbness    Osteopenia    Pulmonary nodule noted in 2010   right upper lobe -- stable (monitored by pcp)   Status post dilation of esophageal narrowing 2011   Urge incontinence of urine    Family History  Problem Relation Age of Onset   Hypertension Mother    Heart failure Mother    Colon polyps Mother    Other Father        had to have esogasus dilated   Heart disease Brother    Stroke Maternal Grandmother    Hypertension Brother    Heart disease Brother    Healthy Son    Esophageal cancer Neg Hx    Colon cancer Neg Hx    Rectal cancer Neg Hx    Stomach cancer  Neg Hx    Past Surgical History:  Procedure Laterality Date   CATARACT EXTRACTION, BILATERAL  07/2017   with lens implants   CERVICAL DISKECTOMY AND FUSION  05/14/2002   C5 - 7   COLONOSCOPY     CYSTO WITH HYDRODISTENSION  07/19/2011   Procedure: CYSTOSCOPY/HYDRODISTENSION;  Surgeon: Anner Crete;  Location: [facility];  Service: Urology;  Laterality: N/A;  CYSTOSCOPY WITH HYDORDISTENTION OF BLADDER AND INSTILLATION OF MARCAINE AND PYRIDIUM   cysto/ hod  03/10/2009   [facility]   CYSTOSCOPY N/A 05/01/2013   Procedure: CYSTOSCOPY;  Surgeon: Martina Sinner, MD;  Location: [facility];  Service: Urology;  Laterality: N/A;   CYSTOSCOPY WITH INJECTION  06/17/2012   Procedure: CYSTOSCOPY WITH INJECTION;  Surgeon: Martina Sinner, MD;  Location: [facility] [place];  Service: Urology;  Laterality: N/A;  BOTOX   CYSTOSCOPY WITH INJECTION N/A 12/08/2012   Procedure: CYSTOSCOPY WITH BOTOX INJECTION;  Surgeon: Martina Sinner, MD;  Location: [facility];  Service: Urology;  Laterality: N/A;   EXCISION NEUROMA Right    laparoscopy ovarian cystectomy  2000 (APPROX)   left rotator cuff repair  06/2008   POSTERIOR FUSION  CERVICAL SPINE  06/29/2009   C4 - 5   PUBOVAGINAL SLING N/A 05/01/2013   Procedure:  Jessica Priest;  Surgeon: Martina Sinner, MD;  Location: [facility];  Service: Urology;  Laterality: N/A;   ROTATOR CUFF REPAIR  2001   RIGHT SHOULDER   TOE FUSION Right 10/24/2022   First digit   TONSILLECTOMY  1981   VAGINAL HYSTERECTOMY  1992   Social History   Occupational History   Occupation: retired  Tobacco Use   Smoking status: Former    Current packs/day: 0.00    Average packs/day: 0.1 packs/day for 20.0 years (2.0 ttl pk-yrs)    Types: Cigarettes    Start date: 07/16/1986    Quit date: 07/16/2006    Years since quitting: 17.3    Passive exposure: Never   Smokeless tobacco: Never  Vaping Use   Vaping status: Never Used  Substance and Sexual Activity   Alcohol use: Yes    Comment: rare   Drug use: No   Sexual activity: Not on file

## 2023-11-11 ENCOUNTER — Other Ambulatory Visit: Payer: Self-pay

## 2023-11-11 ENCOUNTER — Ambulatory Visit: Payer: Medicare Other | Admitting: Physical Medicine and Rehabilitation

## 2023-11-11 VITALS — BP 126/82 | HR 66

## 2023-11-11 DIAGNOSIS — M5416 Radiculopathy, lumbar region: Secondary | ICD-10-CM

## 2023-11-11 MED ORDER — METHYLPREDNISOLONE ACETATE 40 MG/ML IJ SUSP
40.0000 mg | Freq: Once | INTRAMUSCULAR | Status: AC
  Administered 2023-11-11: 40 mg

## 2023-11-11 NOTE — Progress Notes (Signed)
 Pain Score---5 No Allergies to Contrast Dye No Blood Thinners

## 2023-11-11 NOTE — Procedures (Signed)
 S1 Lumbosacral Transforaminal Epidural Steroid Injection - Sub-Pedicular Approach with Fluoroscopic Guidance   Patient: Brenda Gray      Date of Birth: 1961-02-22 MRN: [id number] PCP: Garlan Fillers, MD      Visit Date: 11/11/2023   Universal Protocol:    Date/Time: 11/10/2509:13 AM  Consent Given By: the patient  Position:  PRONE  Additional Comments: Vital signs were monitored before and after the procedure. Patient was prepped and draped in the usual sterile fashion. The correct patient, procedure, and site was verified.   Injection Procedure Details:  Procedure Site One Meds Administered:  Meds ordered this encounter  Medications   methylPREDNISolone acetate (DEPO-MEDROL) injection 40 mg    Laterality: Bilateral  Location/Site:  S1 Foramen   Needle size: 22 ga.  Needle type: Spinal  Needle Placement: Transforaminal  Findings:   -Comments: Excellent flow of contrast along the nerve, nerve root and into the epidural space.  Epidurogram: Contrast epidurogram showed no nerve root cut off or restricted flow pattern.  Procedure Details: After squaring off the sacral end-plate to get a true AP view, the C-arm was positioned so that the best possible view of the S1 foramen was visualized. The soft tissues overlying this structure were infiltrated with 2-3 ml. of 1% Lidocaine without Epinephrine.    The spinal needle was inserted toward the target using a "trajectory" view along the fluoroscope beam.  Under AP and lateral visualization, the needle was advanced so it did not puncture dura. Biplanar projections were used to confirm position. Aspiration was confirmed to be negative for CSF and/or blood. A 1-2 ml. volume of Isovue-250 was injected and flow of contrast was noted at each level. Radiographs were obtained for documentation purposes.   After attaining the desired flow of contrast documented above, a 0.5 to 1.0 ml test dose of 0.25% Marcaine was injected  into each respective transforaminal space.  The patient was observed for 90 seconds post injection.  After no sensory deficits were reported, and normal lower extremity motor function was noted,   the above injectate was administered so that equal amounts of the injectate were placed at each foramen (level) into the transforaminal epidural space.   Additional Comments:  No complications occurred Dressing: Band-Aid with 2 x 2 sterile gauze    Post-procedure details: Patient was observed during the procedure. Post-procedure instructions were reviewed.  Patient left the clinic in stable condition.

## 2023-11-11 NOTE — Patient Instructions (Signed)

## 2023-11-11 NOTE — Progress Notes (Signed)
 Brenda Gray - 63 y.o. female MRN [id number]  Date of birth: 1960/12/30  Office Visit Note: Visit Date: 11/11/2023 PCP: Garlan Fillers, MD Referred by: Garlan Fillers, MD  Subjective: Chief Complaint  Patient presents with   Lower Back - Pain   HPI:  Brenda Gray is a 63 y.o. female who comes in today at the request of Ellin Goodie, FNP for planned Bilateral S1-2 Lumbar Transforaminal epidural steroid injection with fluoroscopic guidance.  The patient has failed conservative care including home exercise, medications, time and activity modification.  This injection will be diagnostic and hopefully therapeutic.  Please see requesting physician notes for further details and justification.   ROS Otherwise per HPI.  Assessment & Plan: Visit Diagnoses:    ICD-10-CM   1. Lumbar radiculopathy  M54.16 XR C-ARM NO REPORT    Epidural Steroid injection    methylPREDNISolone acetate (DEPO-MEDROL) injection 40 mg      Plan: No additional findings.   Meds & Orders:  Meds ordered this encounter  Medications   methylPREDNISolone acetate (DEPO-MEDROL) injection 40 mg    Orders Placed This Encounter  Procedures   XR C-ARM NO REPORT   Epidural Steroid injection    Follow-up: Return for visit to requesting provider as needed.   Procedures: No procedures performed  S1 Lumbosacral Transforaminal Epidural Steroid Injection - Sub-Pedicular Approach with Fluoroscopic Guidance   Patient: Brenda Gray      Date of Birth: Sep 25, 1960 MRN: [id number] PCP: Garlan Fillers, MD      Visit Date: 11/11/2023   Universal Protocol:    Date/Time: 11/10/2509:13 AM  Consent Given By: the patient  Position:  PRONE  Additional Comments: Vital signs were monitored before and after the procedure. Patient was prepped and draped in the usual sterile fashion. The correct patient, procedure, and site was verified.   Injection Procedure Details:  Procedure Site One Meds  Administered:  Meds ordered this encounter  Medications   methylPREDNISolone acetate (DEPO-MEDROL) injection 40 mg    Laterality: Bilateral  Location/Site:  S1 Foramen   Needle size: 22 ga.  Needle type: Spinal  Needle Placement: Transforaminal  Findings:   -Comments: Excellent flow of contrast along the nerve, nerve root and into the epidural space.  Epidurogram: Contrast epidurogram showed no nerve root cut off or restricted flow pattern.  Procedure Details: After squaring off the sacral end-plate to get a true AP view, the C-arm was positioned so that the best possible view of the S1 foramen was visualized. The soft tissues overlying this structure were infiltrated with 2-3 ml. of 1% Lidocaine without Epinephrine.    The spinal needle was inserted toward the target using a "trajectory" view along the fluoroscope beam.  Under AP and lateral visualization, the needle was advanced so it did not puncture dura. Biplanar projections were used to confirm position. Aspiration was confirmed to be negative for CSF and/or blood. A 1-2 ml. volume of Isovue-250 was injected and flow of contrast was noted at each level. Radiographs were obtained for documentation purposes.   After attaining the desired flow of contrast documented above, a 0.5 to 1.0 ml test dose of 0.25% Marcaine was injected into each respective transforaminal space.  The patient was observed for 90 seconds post injection.  After no sensory deficits were reported, and normal lower extremity motor function was noted,   the above injectate was administered so that equal amounts of the injectate were placed at each foramen (level) into the  transforaminal epidural space.   Additional Comments:  No complications occurred Dressing: Band-Aid with 2 x 2 sterile gauze    Post-procedure details: Patient was observed during the procedure. Post-procedure instructions were reviewed.  Patient left the clinic in stable condition.    Clinical History: CLINICAL DATA:  Lumbar spondylosis with radiculopathy. Lumbar facet arthropathy. Chronic low back pain radiating to both sides with numbness and weakness.   EXAM: MRI LUMBAR SPINE WITHOUT CONTRAST   TECHNIQUE: Multiplanar, multisequence MR imaging of the lumbar spine was performed. No intravenous contrast was administered.   COMPARISON:  Lumbar spine MRI 01/06/2010   FINDINGS: Segmentation:  Standard.   Alignment:  Mild lumbar dextroscoliosis.  No significant listhesis.   Vertebrae: No fracture, suspicious marrow lesion, or significant marrow edema. Small Schmorl's nodes in the lower thoracic spine. Asymmetric right-sided chronic degenerative endplate changes at L5-S1.   Conus medullaris and cauda equina: Conus extends to the lower L1 level. Conus and cauda equina appear normal.   Paraspinal and other soft tissues: Unremarkable.   Disc levels:   Disc desiccation throughout the lumbar spine. Mild disc space narrowing from L1-2 to L3-4 and at L5-S1. Moderate disc space narrowing at T12-L1.   T12-L1: Mild disc bulging and endplate spurring without stenosis, unchanged.   L1-2: Progressive disc degeneration. Disc bulging results in mild left lateral recess stenosis without spinal or neural foraminal stenosis.   L2-3: Progressive disc degeneration. Disc bulging and mild facet and ligamentum flavum hypertrophy result in mild left greater than right lateral recess stenosis without spinal or neural foraminal stenosis.   L3-4: Progressive disc degeneration. Disc bulging and mild facet and ligamentum flavum hypertrophy result in mild right and mild-to-moderate left lateral recess stenosis and mild left neural foraminal stenosis without significant spinal stenosis.   L4-5: Disc bulging and mild-to-moderate facet and ligamentum flavum hypertrophy result in mild-to-moderate bilateral lateral recess stenosis, mildly progressed from prior. No significant  spinal or neural foraminal stenosis.   L5-S1: Progressive disc degeneration. Disc bulging and mild-to-moderate right and mild left facet and ligamentum flavum hypertrophy result in mild right neural foraminal stenosis without spinal stenosis.   IMPRESSION: 1. Progressive lumbar disc and facet degeneration since 2011. 2. Mild-to-moderate lateral recess stenosis at L3-4 and L4-5. 3. Mild neural foraminal stenosis on the left at L3-4 and on the right at L5-S1.     Electronically Signed   By: Sebastian Ache M.D.   On: 06/04/2022 10:29     Objective:  VS:  HT:    WT:   BMI:     BP:126/82  HR:66bpm  TEMP: ( )  RESP:  Physical Exam Vitals and nursing note reviewed.  Constitutional:      General: She is not in acute distress.    Appearance: Normal appearance. She is not ill-appearing.  HENT:     Head: Normocephalic and atraumatic.     Right Ear: External ear normal.     Left Ear: External ear normal.  Eyes:     Extraocular Movements: Extraocular movements intact.  Cardiovascular:     Rate and Rhythm: Normal rate.     Pulses: Normal pulses.  Pulmonary:     Effort: Pulmonary effort is normal. No respiratory distress.  Abdominal:     General: There is no distension.     Palpations: Abdomen is soft.  Musculoskeletal:        General: Tenderness present.     Cervical back: Neck supple.     Right lower leg: No edema.  Left lower leg: No edema.     Comments: Patient has good distal strength with no pain over the greater trochanters.  No clonus or focal weakness.  Skin:    Findings: No erythema, lesion or rash.  Neurological:     General: No focal deficit present.     Mental Status: She is alert and oriented to person, place, and time.     Sensory: No sensory deficit.     Motor: No weakness or abnormal muscle tone.     Coordination: Coordination normal.  Psychiatric:        Mood and Affect: Mood normal.        Behavior: Behavior normal.      Imaging: XR C-ARM NO  REPORT Result Date: 11/11/2023 Please see Notes tab for imaging impression.

## 2023-11-21 ENCOUNTER — Ambulatory Visit: Payer: Medicare Other | Attending: Physician Assistant | Admitting: Physician Assistant

## 2023-11-21 DIAGNOSIS — M170 Bilateral primary osteoarthritis of knee: Secondary | ICD-10-CM

## 2023-11-21 MED ORDER — LIDOCAINE HCL 1 % IJ SOLN
1.5000 mL | INTRAMUSCULAR | Status: AC | PRN
  Administered 2023-11-21: 1.5 mL

## 2023-11-21 MED ORDER — HYALURONAN 30 MG/2ML IX SOSY
30.0000 mg | PREFILLED_SYRINGE | INTRA_ARTICULAR | Status: AC | PRN
  Administered 2023-11-21: 30 mg via INTRA_ARTICULAR

## 2023-11-21 MED ORDER — HYALURONAN 30 MG/2ML IX SOSY
30.0000 mg | PREFILLED_SYRINGE | INTRA_ARTICULAR | Status: AC | PRN
Start: 2023-11-21 — End: 2023-11-21
  Administered 2023-11-21: 30 mg via INTRA_ARTICULAR

## 2023-11-21 NOTE — Progress Notes (Signed)
   Procedure Note  Patient: TAYJAH LOBDELL             Date of Birth: 02/23/1961           MRN: [id number]             Visit Date: 11/21/2023  Procedures: Visit Diagnoses:  1. Primary osteoarthritis of both knees     Orthovisc #1 bilateral knees, B/B Large Joint Inj: bilateral knee on 11/21/2023 8:03 AM Indications: pain Details: 25 G 1.5 in needle, medial approach  Arthrogram: No  Medications (Right): 1.5 mL lidocaine 1 %; 30 mg Hyaluronan 30 MG/2ML Aspirate (Right): 0 mL Medications (Left): 1.5 mL lidocaine 1 %; 30 mg Hyaluronan 30 MG/2ML Aspirate (Left): 0 mL Outcome: tolerated well, no immediate complications Procedure, treatment alternatives, risks and benefits explained, specific risks discussed. Consent was given by the patient. Immediately prior to procedure a time out was called to verify the correct patient, procedure, equipment, support staff and site/side marked as required.       Patient tolerated the procedures well.  Aftercare was discussed.  Sherron Ales, PA-C

## 2023-11-21 NOTE — Progress Notes (Unsigned)
 Office Visit Note  Patient: Brenda Gray             Date of Birth: [birth year]           MRN: [id number]             PCP: Garlan Fillers, MD Referring: Garlan Fillers, MD Visit Date: 12/05/2023 Occupation: @GUAROCC @  Subjective:  Left shoulder joint pain   History of Present Illness: Brenda Gray is a 63 y.o. female with history of osteoarthritis and DDD.  Patient presents today for the third Orthovisc injection of the series for both knees.  She is noticing an improvement in her pain and mobility since starting the series.  Patient states that she has had less difficulty performing her strengthening exercises.  Patient states that overall her arthralgias have been improving with the warmer weather temperatures.  She takes tramadol as needed if she has moderate to severe pain especially in her neck or left shoulder.  Patient has been taking tramadol about 3 days a week for pain relief.  She also continues to find tizanidine to be effective at managing her symptoms.  She does not need a refill at this time since she is only been taking half a tablet of tizanidine.  She presents today with ongoing discomfort in the left shoulder.  No recent injury or fall.  Patient requested a cortisone injection today.   Patient continues to take a calcium vitamin D supplement on a daily basis.  Her IV Reclast is currently on hold.  She will be due to update DEXA in February 2026.    Activities of Daily Living:  Patient reports morning stiffness for 30 minutes or less. Patient Reports nocturnal pain.  Difficulty dressing/grooming: Denies Difficulty climbing stairs: Denies Difficulty getting out of chair: Denies Difficulty using hands for taps, buttons, cutlery, and/or writing: Reports  Review of Systems  Constitutional:  Negative for fatigue.  HENT:  Negative for mouth sores, mouth dryness and nose dryness.   Eyes:  Negative for pain and dryness.  Respiratory:  Negative for  shortness of breath and difficulty breathing.   Cardiovascular:  Negative for chest pain and palpitations.  Gastrointestinal:  Negative for blood in stool, constipation and diarrhea.  Endocrine: Negative for increased urination.  Genitourinary:  Negative for involuntary urination.  Musculoskeletal:  Positive for joint pain, joint pain and morning stiffness. Negative for gait problem, joint swelling, myalgias, muscle weakness, muscle tenderness and myalgias.  Skin:  Negative for color change, rash, hair loss and sensitivity to sunlight.  Allergic/Immunologic: Negative for susceptible to infections.  Neurological:  Negative for dizziness and headaches.  Hematological:  Negative for swollen glands.  Psychiatric/Behavioral:  Negative for depressed mood and sleep disturbance. The patient is not nervous/anxious.     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 pylori infection    H/O: upper GI bleed     Hyperlipidemia    Mild acid reflux OCCASIONAL--  WATCHES DIET   Nocturia    Numbness s/p cerival fusion   right side pain and numbness    Osteopenia    Pulmonary nodule noted in 2010   right upper lobe -- stable (monitored by pcp)   Status post dilation of esophageal narrowing 2011   Urge incontinence of urine     Family History  Problem Relation Age of Onset   Hypertension Mother    Heart failure Mother    Colon polyps Mother    Kidney failure Mother    Other Father        had to have esogasus dilated   Heart disease Brother    Hypertension Brother    Heart disease Brother    Stroke Maternal Grandmother    Healthy Son    Esophageal cancer Neg Hx    Colon cancer Neg Hx    Rectal cancer Neg Hx    Stomach cancer Neg Hx    Past Surgical History:  Procedure Laterality Date   CATARACT EXTRACTION, BILATERAL  07/2017   with lens implants   CERVICAL DISKECTOMY AND FUSION  05/14/2002   C5 - 7   COLONOSCOPY     CYSTO WITH HYDRODISTENSION  07/19/2011   Procedure: CYSTOSCOPY/HYDRODISTENSION;  Surgeon: Anner Crete;  Location: [facility];  Service: Urology;  Laterality: N/A;  CYSTOSCOPY WITH HYDORDISTENTION OF BLADDER AND INSTILLATION OF MARCAINE AND PYRIDIUM   cysto/ hod  03/10/2009   [facility]   CYSTOSCOPY N/A 05/01/2013   Procedure: CYSTOSCOPY;  Surgeon: Martina Sinner, MD;  Location: [facility];  Service: Urology;  Laterality: N/A;   CYSTOSCOPY WITH INJECTION  06/17/2012   Procedure: CYSTOSCOPY WITH INJECTION;  Surgeon: Martina Sinner, MD;  Location: [facility];  Service: Urology;  Laterality: N/A;  BOTOX   CYSTOSCOPY WITH INJECTION N/A 12/08/2012   Procedure: CYSTOSCOPY WITH BOTOX INJECTION;  Surgeon: Martina Sinner, MD;  Location: [facility] [place];  Service: Urology;  Laterality: N/A;   EXCISION NEUROMA Right    laparoscopy ovarian cystectomy  2000 (APPROX)   left rotator cuff repair  06/2008   POSTERIOR FUSION  CERVICAL SPINE  06/29/2009   C4 - 5   PUBOVAGINAL SLING N/A 05/01/2013   Procedure:  Jessica Priest;  Surgeon: Martina Sinner, MD;  Location: [facility];  Service: Urology;  Laterality: N/A;   ROTATOR CUFF REPAIR  2001   RIGHT SHOULDER   TOE FUSION Right 10/24/2022   First digit   TONSILLECTOMY  1981   VAGINAL HYSTERECTOMY  1992   Social History   Social History Narrative   Not on file   Immunization History  Administered Date(s) Administered   PFIZER(Purple Top)SARS-COV-2 Vaccination 11/27/2019, 12/18/2019, 06/24/2020, 01/12/2021   Tdap 02/01/2019     Objective: Vital Signs: BP 130/75 (BP Location: Left Arm, Patient Position: Sitting, Cuff Size: Normal)   Pulse 63   Resp 15   Ht 5\' 4"  (1.626 m)   Wt 136 lb 12.8 oz (62.1 kg)  BMI 23.48 kg/m    Physical Exam Vitals and nursing note reviewed.  Constitutional:      Appearance: She is well-developed.  HENT:     Head: Normocephalic and atraumatic.  Eyes:     Conjunctiva/sclera: Conjunctivae normal.  Cardiovascular:     Rate and Rhythm: Normal rate and regular rhythm.     Heart sounds: Normal heart sounds.  Pulmonary:     Effort: Pulmonary effort is normal.     Breath sounds: Normal breath sounds.  Abdominal:     General: Bowel sounds are normal.     Palpations: Abdomen is soft.  Musculoskeletal:     Cervical back: Normal range of motion.  Lymphadenopathy:     Cervical: No cervical adenopathy.  Skin:    General: Skin is warm and dry.     Capillary Refill: Capillary refill takes less than 2 seconds.  Neurological:     Mental Status: She is alert and oriented to person, place, and time.  Psychiatric:        Behavior: Behavior normal.      Musculoskeletal Exam: C-spine has limited range of motion especially with lateral rotation and extension.  Thoracic and lumbar spine and good range of motion.  Right shoulder is full range of motion.  Left shoulder with some discomfort with abduction and  internal rotation.  Tenderness over the left subacromial bursa.  Elbow joints, wrist joints, MCPs, PIPs, DIPs have good range of motion with no synovitis.  CMC, PIP, DIP thickening consistent with osteoarthritis of both hands.  Hip joints have good range of motion with no groin pain.  Tenderness over the trochanteric bursa bilaterally.  Knee joints have good range of motion with no warmth or effusion.  Ankle joints have good range of motion with no tenderness or joint swelling.  CDAI Exam: CDAI Score: -- Patient Global: --; Provider Global: -- Swollen: --; Tender: -- Joint Exam 12/05/2023   No joint exam has been documented for this visit   There is currently no information documented on the homunculus. Go to the Rheumatology activity and complete the homunculus joint exam.  Investigation: No additional findings.  Imaging: XR C-ARM NO REPORT Result Date: 11/11/2023 Please see Notes tab for imaging impression.  Epidural Steroid injection Result Date: 11/11/2023 Tyrell Antonio, MD     11/11/2023 11:14 AM S1 Lumbosacral Transforaminal Epidural Steroid Injection - Sub-Pedicular Approach with Fluoroscopic Guidance Patient: YARELLY KUBA     Date of Birth: [birth year] MRN: [id number] PCP: Garlan Fillers, MD     Visit Date: 11/11/2023  Universal Protocol:   Date/Time: 11/10/2509:13 AM Consent Given By: the patient Position:  PRONE Additional Comments: Vital signs were monitored before and after the procedure. Patient was prepped and draped in the usual sterile fashion. The correct patient, procedure, and site was verified. Injection Procedure Details: Procedure Site One Meds Administered: Meds ordered this encounter Medications  methylPREDNISolone acetate (DEPO-MEDROL) injection 40 mg Laterality: Bilateral Location/Site: S1 Foramen Needle size: 22 ga. Needle type: Spinal Needle Placement: Transforaminal Findings:  -Comments: Excellent flow of contrast along the nerve, nerve root and into the  epidural space. Epidurogram: Contrast epidurogram showed no nerve root cut off or restricted flow pattern. Procedure Details: After squaring off the sacral end-plate to get a true AP view, the C-arm was positioned so that the best possible view of the S1 foramen was visualized. The soft tissues overlying this structure were infiltrated with 2-3 ml. of 1% Lidocaine without Epinephrine. The spinal needle was inserted  toward the target using a "trajectory" view along the fluoroscope beam.  Under AP and lateral visualization, the needle was advanced so it did not puncture dura. Biplanar projections were used to confirm position. Aspiration was confirmed to be negative for CSF and/or blood. A 1-2 ml. volume of Isovue-250 was injected and flow of contrast was noted at each level. Radiographs were obtained for documentation purposes. After attaining the desired flow of contrast documented above, a 0.5 to 1.0 ml test dose of 0.25% Marcaine was injected into each respective transforaminal space.  The patient was observed for 90 seconds post injection.  After no sensory deficits were reported, and normal lower extremity motor function was noted,   the above injectate was administered so that equal amounts of the injectate were placed at each foramen (level) into the transforaminal epidural space. Additional Comments: No complications occurred Dressing: Band-Aid with 2 x 2 sterile gauze  Post-procedure details: Patient was observed during the procedure. Post-procedure instructions were reviewed. Patient left the clinic in stable condition.   Recent Labs: Lab Results  Component Value Date   WBC 5.5 07/31/2021   HGB 15.0 07/31/2021   PLT 248 07/31/2021   NA 142 07/31/2021   K 5.2 07/31/2021   CL 104 07/31/2021   CO2 30 07/31/2021   GLUCOSE 91 07/31/2021   BUN 16 07/31/2021   CREATININE 0.81 07/31/2021   BILITOT 0.6 07/31/2021   ALKPHOS 65 11/05/2016   AST 30 07/31/2021   ALT 28 07/31/2021   PROT 7.4  07/31/2021   ALBUMIN 4.3 11/05/2016   CALCIUM 10.1 07/31/2021   GFRAA 98 12/17/2017    Speciality Comments: Dexa: 10/11/2022 Osteopenia T-Score -1.7 BMD 0.865.  Previous reclast IV: 11/09/2016, 12/06/2017, 08/18/2021.  Procedures:  Large Joint Inj: bilateral knee on 12/05/2023 8:13 AM Indications: pain Details: 25 G 1.5 in needle, medial approach  Arthrogram: No  Medications (Right): 1.5 mL lidocaine 1 %; 30 mg Hyaluronan 30 MG/2ML Aspirate (Right): 0 mL Medications (Left): 1.5 mL lidocaine 1 %; 30 mg Hyaluronan 30 MG/2ML Aspirate (Left): 0 mL Outcome: tolerated well, no immediate complications Procedure, treatment alternatives, risks and benefits explained, specific risks discussed. Consent was given by the patient. Immediately prior to procedure a time out was called to verify the correct patient, procedure, equipment, support staff and site/side marked as required.    Large Joint Inj: L subacromial bursa on 12/05/2023 8:42 AM Indications: pain Details: 27 G 1.5 in needle, posterior approach  Arthrogram: No  Medications: 1 mL lidocaine 1 %; 40 mg triamcinolone acetonide 40 MG/ML Aspirate: 0 mL Outcome: tolerated well, no immediate complications Procedure, treatment alternatives, risks and benefits explained, specific risks discussed. Consent was given by the patient. Immediately prior to procedure a time out was called to verify the correct patient, procedure, equipment, support staff and site/side marked as required. Patient was prepped and draped in the usual sterile fashion.     Allergies: Cymbalta [duloxetine hcl] and Lyrica [pregabalin]   Assessment / Plan:     Visit Diagnoses: Chronic left shoulder pain -Patient underwent rotator cuff repair in the past.  S/p left subacromial cortisone injection on 12/06/2022. Left glenohumeral injection performed on 06/07/23.  Patient continues to have chronic pain involving the left shoulder.  No recent injury or fall.  She has been taking  tramadol as needed for pain relief.  She has had a recurrence of symptoms and requested a left subacromial bursa cortisone injection today.  She tolerated procedure well.  Procedure note was completed  above.  Aftercare was discussed.  S/P rotator cuff repair - Bilateral- Performed by Dr. August Saucer.  Primary osteoarthritis of both knees - She has good range of motion of both knee joints on examination today.  No warmth or effusion noted.  Patient presents today for the third Orthovisc injection of the series for both knees.  She tolerated procedures well.  Procedure notes were completed above.  Aftercare was discussed.  She has already noticed an improvement in the pain and stiffness in her knee joint since starting the series.  She remains active exercising on a daily basis.  Plan: Large Joint Inj: bilateral knee  Primary osteoarthritis of both hands: She has CMC, PIP, DIP thickening consistent with osteoarthritis of both hands.  No inflammation noted on examination today.  Discussed the importance of joint protection and muscle strengthening.  Trochanteric bursitis of both hips: She has tenderness over bilateral trochanteric bursa.  Good range of motion with no groin pain currently.  Primary osteoarthritis of both feet - Underwent right great toe fusion and decompression of neuroma third interspace performed by Dr. Ardelle Anton on 10/24/2022.  DDD (degenerative disc disease), cervical: C-spine has limited range of motion with extension and lateral rotation.  No symptoms of radiculopathy at this time.  She takes tramadol sparingly for pain relief.  DDD (degenerative disc disease), thoracic: No midline spinal tenderness.  Degeneration of intervertebral disc of lumbar region without discogenic back pain or lower extremity pain -She had a radiofrequency ablation performed on 06/22/20. Repeat epidural steroid injection on 11/11/2023.  Age-related osteoporosis without current pathological fracture - 2/12024 DEXA  scan T-score -1.7, BMD 0.865 AP spine, 5% change compared to 2022.  Previous reclast IV: 11/09/2016, 12/06/2017, 08/18/2021. Patient is currently holding IV Reclast until her next DEXA in February 2026.   She is taking a calcium and vitamin D supplement. No recent falls.  She exercises on a daily basis including weightlifting.  Vitamin D deficiency: She is taking a calcium and vitamin D supplement daily.  Other medical conditions are listed as follows:  History of peptic ulcer disease  Dyslipidemia  Interstitial cystitis  Orders: Orders Placed This Encounter  Procedures   Large Joint Inj: bilateral knee   Large Joint Inj: L subacromial bursa   No orders of the defined types were placed in this encounter.    Follow-Up Instructions: Return in about 6 months (around 06/06/2024) for Osteoarthritis.   Gearldine Bienenstock, PA-C  Note - This record has been created using Dragon software.  Chart creation errors have been sought, but may not always  have been located. Such creation errors do not reflect on  the standard of medical care.

## 2023-11-28 ENCOUNTER — Ambulatory Visit: Payer: Medicare Other | Attending: Physician Assistant | Admitting: Physician Assistant

## 2023-11-28 DIAGNOSIS — M170 Bilateral primary osteoarthritis of knee: Secondary | ICD-10-CM

## 2023-11-28 MED ORDER — HYALURONAN 30 MG/2ML IX SOSY
30.0000 mg | PREFILLED_SYRINGE | INTRA_ARTICULAR | Status: AC | PRN
  Administered 2023-11-28: 30 mg via INTRA_ARTICULAR

## 2023-11-28 MED ORDER — LIDOCAINE HCL 1 % IJ SOLN
1.5000 mL | INTRAMUSCULAR | Status: AC | PRN
  Administered 2023-11-28: 1.5 mL

## 2023-11-28 NOTE — Progress Notes (Signed)
   Procedure Note  Patient: Brenda Gray             Date of Birth: [birth year]           MRN: [id number]             Visit Date: 11/28/2023  Procedures: Visit Diagnoses:  1. Primary osteoarthritis of both knees     Orthovisc #2 bilateral knees, B/B Large Joint Inj: bilateral knee on 11/28/2023 7:56 AM Indications: pain Details: 25 G 1.5 in needle, medial approach  Arthrogram: No  Medications (Right): 1.5 mL lidocaine 1 %; 30 mg Hyaluronan 30 MG/2ML Aspirate (Right): 0 mL Medications (Left): 1.5 mL lidocaine 1 %; 30 mg Hyaluronan 30 MG/2ML Aspirate (Left): 0 mL Outcome: tolerated well, no immediate complications Procedure, treatment alternatives, risks and benefits explained, specific risks discussed. Consent was given by the patient. Immediately prior to procedure a time out was called to verify the correct patient, procedure, equipment, support staff and site/side marked as required.     Patient tolerated the procedures well.  Aftercare was discussed.  Sherron Ales, PA-C

## 2023-12-05 ENCOUNTER — Encounter: Payer: Self-pay | Admitting: Physician Assistant

## 2023-12-05 ENCOUNTER — Ambulatory Visit: Payer: Medicare Other | Attending: Physician Assistant | Admitting: Physician Assistant

## 2023-12-05 VITALS — BP 130/75 | HR 63 | Resp 15 | Ht 64.0 in | Wt 136.8 lb

## 2023-12-05 DIAGNOSIS — M19042 Primary osteoarthritis, left hand: Secondary | ICD-10-CM

## 2023-12-05 DIAGNOSIS — M19041 Primary osteoarthritis, right hand: Secondary | ICD-10-CM

## 2023-12-05 DIAGNOSIS — M19071 Primary osteoarthritis, right ankle and foot: Secondary | ICD-10-CM

## 2023-12-05 DIAGNOSIS — G8929 Other chronic pain: Secondary | ICD-10-CM

## 2023-12-05 DIAGNOSIS — M810 Age-related osteoporosis without current pathological fracture: Secondary | ICD-10-CM

## 2023-12-05 DIAGNOSIS — N3010 Interstitial cystitis (chronic) without hematuria: Secondary | ICD-10-CM

## 2023-12-05 DIAGNOSIS — M5134 Other intervertebral disc degeneration, thoracic region: Secondary | ICD-10-CM

## 2023-12-05 DIAGNOSIS — E785 Hyperlipidemia, unspecified: Secondary | ICD-10-CM

## 2023-12-05 DIAGNOSIS — M25512 Pain in left shoulder: Secondary | ICD-10-CM

## 2023-12-05 DIAGNOSIS — M7062 Trochanteric bursitis, left hip: Secondary | ICD-10-CM

## 2023-12-05 DIAGNOSIS — M7061 Trochanteric bursitis, right hip: Secondary | ICD-10-CM

## 2023-12-05 DIAGNOSIS — M170 Bilateral primary osteoarthritis of knee: Secondary | ICD-10-CM | POA: Diagnosis not present

## 2023-12-05 DIAGNOSIS — Z9889 Other specified postprocedural states: Secondary | ICD-10-CM

## 2023-12-05 DIAGNOSIS — M5030 Other cervical disc degeneration, unspecified cervical region: Secondary | ICD-10-CM

## 2023-12-05 DIAGNOSIS — E559 Vitamin D deficiency, unspecified: Secondary | ICD-10-CM

## 2023-12-05 DIAGNOSIS — M19072 Primary osteoarthritis, left ankle and foot: Secondary | ICD-10-CM

## 2023-12-05 DIAGNOSIS — M51369 Other intervertebral disc degeneration, lumbar region without mention of lumbar back pain or lower extremity pain: Secondary | ICD-10-CM

## 2023-12-05 DIAGNOSIS — Z8711 Personal history of peptic ulcer disease: Secondary | ICD-10-CM

## 2023-12-05 MED ORDER — TRIAMCINOLONE ACETONIDE 40 MG/ML IJ SUSP
40.0000 mg | INTRAMUSCULAR | Status: AC | PRN
  Administered 2023-12-05: 40 mg via INTRA_ARTICULAR

## 2023-12-05 MED ORDER — HYALURONAN 30 MG/2ML IX SOSY
30.0000 mg | PREFILLED_SYRINGE | INTRA_ARTICULAR | Status: AC | PRN
  Administered 2023-12-05: 30 mg via INTRA_ARTICULAR

## 2023-12-05 MED ORDER — LIDOCAINE HCL 1 % IJ SOLN
1.5000 mL | INTRAMUSCULAR | Status: AC | PRN
  Administered 2023-12-05: 1.5 mL

## 2023-12-05 MED ORDER — LIDOCAINE HCL 1 % IJ SOLN
1.0000 mL | INTRAMUSCULAR | Status: AC | PRN
  Administered 2023-12-05: 1 mL

## 2024-02-19 ENCOUNTER — Other Ambulatory Visit: Payer: Self-pay | Admitting: Physician Assistant

## 2024-02-20 NOTE — Telephone Encounter (Signed)
 Last Fill: 09/09/2023  Next Visit: 06/16/2024  Last Visit: 12/05/2023  Dx: Chronic left shoulder pain   Current Dose per office note on 12/05/2023: not discussed  Okay to refill Tizanidine ?

## 2024-04-22 ENCOUNTER — Telehealth: Payer: Self-pay

## 2024-04-22 NOTE — Telephone Encounter (Signed)
 Contacted the patient and advised her handicap placard renewal has been completed and it has been placed up front ready for pickup. A copy of the renewal has been placed in scan place.

## 2024-05-06 DIAGNOSIS — K080 Exfoliation of teeth due to systemic causes: Secondary | ICD-10-CM | POA: Diagnosis not present

## 2024-05-18 DIAGNOSIS — Z01419 Encounter for gynecological examination (general) (routine) without abnormal findings: Secondary | ICD-10-CM | POA: Diagnosis not present

## 2024-05-26 ENCOUNTER — Other Ambulatory Visit: Payer: Self-pay | Admitting: Physical Medicine and Rehabilitation

## 2024-05-26 ENCOUNTER — Telehealth: Payer: Self-pay

## 2024-05-26 DIAGNOSIS — G8929 Other chronic pain: Secondary | ICD-10-CM

## 2024-05-26 DIAGNOSIS — M5416 Radiculopathy, lumbar region: Secondary | ICD-10-CM

## 2024-05-26 NOTE — Telephone Encounter (Signed)
 Last injection 3/25 % 80 relief/function ability Duration of relief/improvement--4.5 months Current pain sore--7 Recent falls or injuries--None Same location and same pain as last time

## 2024-06-02 NOTE — Progress Notes (Signed)
 Office Visit Note  Patient: Brenda Gray             Date of Birth: 1961/06/08           MRN: [id number]             PCP: Yolande Toribio MATSU, MD Referring: Yolande Toribio MATSU, MD Visit Date: 06/16/2024 Occupation: Data Unavailable  Subjective:  Pain in joints  History of Present Illness: Brenda Gray is a 63 y.o. female with osteoarthritis, degenerative disc disease and osteoporosis.  She returns today after her last visit in March 2025.  She states she continues to have some off-and-on discomfort in her shoulders which is manageable.  She had some discomfort in her left shoulder which resolved by itself.  She continues to have pain and stiffness in her hands.  She states she is renovating a home which is causing increased stress on her hands.  She has off-and-on discomfort in the trochanteric region.  She had good response to left subacromial bursa injection and also bilateral knee joint viscosupplement injections in March 2025.  She has some lower back pain.  She has been taking tramadol as needed.  She has an appointment coming up for epidural injection for the lumbar spine.  She has been going to the gym regularly and has been exercising.    Activities of Daily Living:  Patient reports morning stiffness for 20-30 minutes.   Patient Reports nocturnal pain.  Difficulty dressing/grooming: Denies Difficulty climbing stairs: Denies Difficulty getting out of chair: Denies Difficulty using hands for taps, buttons, cutlery, and/or writing: Reports  Review of Systems  Constitutional:  Negative for fatigue.  HENT:  Negative for mouth sores and mouth dryness.   Eyes:  Positive for dryness.  Respiratory:  Negative for shortness of breath.   Cardiovascular:  Negative for chest pain and palpitations.  Gastrointestinal:  Negative for blood in stool, constipation and diarrhea.  Endocrine: Negative for increased urination.  Genitourinary:  Negative for involuntary urination.   Musculoskeletal:  Positive for joint pain, joint pain, joint swelling and morning stiffness. Negative for gait problem, myalgias, muscle weakness, muscle tenderness and myalgias.  Skin:  Negative for color change, rash, hair loss and sensitivity to sunlight.  Allergic/Immunologic: Negative for susceptible to infections.  Neurological:  Negative for dizziness and headaches.  Hematological:  Negative for swollen glands.  Psychiatric/Behavioral:  Negative for depressed mood and sleep disturbance. The patient is not nervous/anxious.     PMFS History:  Patient Active Problem List   Diagnosis Date Noted   Atypical chest pain 06/04/2017   S/P rotator cuff repair 11/04/2016   Dyslipidemia 11/04/2016   History of peptic ulcer disease 11/04/2016   Interstitial cystitis 11/04/2016   DJD (degenerative joint disease), cervical 10/29/2016   Spondylosis of lumbar region without myelopathy or radiculopathy 10/29/2016   Primary osteoarthritis of both knees 10/29/2016   DDD (degenerative disc disease), thoracic 10/29/2016   Primary insomnia 10/29/2016   History of recurrent cystitis 10/29/2016   Primary osteoarthritis of both hands 10/29/2016   Morton neuroma, right 10/15/2016   DYSPHAGIA UNSPECIFIED 07/31/2010   ABDOMINAL PAIN, EPIGASTRIC 07/31/2010    Past Medical History:  Diagnosis Date   Arthritis    Atypical chest pain 06/04/2017   Cataract    bilateral-removed 2 yrs ago 06/25/19   Chronic headache S/P CERVICAL FUSION'S   Cystitis, interstitial    frequency/urgency/nocturia   DDD (degenerative disc disease), cervical    Dry eyes    Frequency of  urination    H. pylori infection    H/O: upper GI bleed    Hyperlipidemia    Mild acid reflux OCCASIONAL--  WATCHES DIET   Nocturia    Numbness s/p cerival fusion   right side pain and numbness    Osteopenia    Pulmonary nodule noted in 2010   right upper lobe -- stable (monitored by pcp)   Status post dilation of esophageal narrowing  2011   Urge incontinence of urine     Family History  Problem Relation Age of Onset   Hypertension Mother    Heart failure Mother    Colon polyps Mother    Kidney failure Mother    Other Father        had to have esogasus dilated   Heart disease Brother    Hypertension Brother    Heart disease Brother    Stroke Maternal Grandmother    Healthy Son    Esophageal cancer Neg Hx    Colon cancer Neg Hx    Rectal cancer Neg Hx    Stomach cancer Neg Hx    Past Surgical History:  Procedure Laterality Date   CATARACT EXTRACTION, BILATERAL  07/2017   with lens implants   CERVICAL DISKECTOMY AND FUSION  05/14/2002   C5 - 7   COLONOSCOPY     CYSTO WITH HYDRODISTENSION  07/19/2011   Procedure: CYSTOSCOPY/HYDRODISTENSION;  Surgeon: Norleen JINNY Seltzer;  Location: [facility];  Service: Urology;  Laterality: N/A;  CYSTOSCOPY WITH HYDORDISTENTION OF BLADDER AND INSTILLATION OF MARCAINE  AND PYRIDIUM    cysto/ hod  03/10/2009   [facility].C.   CYSTOSCOPY N/A 05/01/2013   Procedure: CYSTOSCOPY;  Surgeon: Glendia DELENA Elizabeth, MD;  Location: [facility];  Service: Urology;  Laterality: N/A;   CYSTOSCOPY WITH INJECTION  06/17/2012   Procedure: CYSTOSCOPY WITH INJECTION;  Surgeon: Glendia DELENA Elizabeth, MD;  Location: [facility];  Service: Urology;  Laterality: N/A;  BOTOX    CYSTOSCOPY WITH INJECTION N/A 12/08/2012   Procedure: CYSTOSCOPY WITH BOTOX  INJECTION;  Surgeon: Glendia DELENA Elizabeth, MD;  Location: [facility];  Service: Urology;  Laterality: N/A;   EXCISION NEUROMA Right    laparoscopy ovarian cystectomy  2000 (APPROX)   left rotator cuff repair  06/2008   POSTERIOR FUSION CERVICAL SPINE  06/29/2009   C4 - 5   PUBOVAGINAL SLING N/A 05/01/2013   Procedure:  Joanna GLADE;  Surgeon: Glendia DELENA Elizabeth, MD;  Location: [facility];  Service: Urology;  Laterality: N/A;   ROTATOR CUFF REPAIR  2001   RIGHT SHOULDER   TOE FUSION Right  10/24/2022   First digit   TONSILLECTOMY  1981   VAGINAL HYSTERECTOMY  1992   Social History   Tobacco Use   Smoking status: Former    Current packs/day: 0.00    Average packs/day: 0.1 packs/day for 20.0 years (2.0 ttl pk-yrs)    Types: Cigarettes    Start date: 07/16/1986    Quit date: 07/16/2006    Years since quitting: 17.9    Passive exposure: Never   Smokeless tobacco: Never  Vaping Use   Vaping status: Never Used  Substance Use Topics   Alcohol  use: Yes    Comment: rare   Drug use: No   Social History   Social History Narrative   Not on file     Immunization History  Administered Date(s) Administered   PFIZER(Purple Top)SARS-COV-2 Vaccination 11/27/2019, 12/18/2019, 06/24/2020, 01/12/2021   Pfizer Covid-19  Vaccine Bivalent Booster 4yrs & up 05/30/2021   Pfizer(Comirnaty)Fall Seasonal Vaccine 12 years and older 06/08/2022, 06/03/2023   Tdap 02/01/2019     Objective: Vital Signs: BP 129/74   Pulse 64   Temp (!) 97.3 F (36.3 C)   Resp 14   Ht 5' 4.5 (1.638 m)   Wt 127 lb (57.6 kg)   BMI 21.46 kg/m    Physical Exam Vitals and nursing note reviewed.  Constitutional:      Appearance: She is well-developed.  HENT:     Head: Normocephalic and atraumatic.  Eyes:     Conjunctiva/sclera: Conjunctivae normal.  Cardiovascular:     Rate and Rhythm: Normal rate and regular rhythm.     Heart sounds: Normal heart sounds.  Pulmonary:     Effort: Pulmonary effort is normal.     Breath sounds: Normal breath sounds.  Abdominal:     General: Bowel sounds are normal.     Palpations: Abdomen is soft.  Musculoskeletal:     Cervical back: Normal range of motion.  Lymphadenopathy:     Cervical: No cervical adenopathy.  Skin:    General: Skin is warm and dry.     Capillary Refill: Capillary refill takes less than 2 seconds.  Neurological:     Mental Status: She is alert and oriented to person, place, and time.  Psychiatric:        Behavior: Behavior normal.       Musculoskeletal Exam: She had limited range of motion of the cervical spine without discomfort.  She had no tenderness over thoracic spine.  She has some tenderness over the lower lumbar region.  There was no SI joint tenderness.  Shoulder joints, elbow joints, wrist joints, MCPs,  were in good range of motion with no synovitis.  She had bilateral PIP and DIP thickening with limited extension of PIP and DIP joints.  Hip joints and knee joints were in good range of motion without any warmth swelling or effusion.  She has some tenderness over bilateral trochanteric bursa.  There was no tenderness over ankles or MTPs.   CDAI Exam: CDAI Score: -- Patient Global: --; Provider Global: -- Swollen: --; Tender: -- Joint Exam 06/16/2024   No joint exam has been documented for this visit   There is currently no information documented on the homunculus. Go to the Rheumatology activity and complete the homunculus joint exam.  Investigation: No additional findings.  Imaging: No results found.  Recent Labs: Lab Results  Component Value Date   WBC 5.5 07/31/2021   HGB 15.0 07/31/2021   PLT 248 07/31/2021   NA 142 07/31/2021   K 5.2 07/31/2021   CL 104 07/31/2021   CO2 30 07/31/2021   GLUCOSE 91 07/31/2021   BUN 16 07/31/2021   CREATININE 0.81 07/31/2021   BILITOT 0.6 07/31/2021   ALKPHOS 65 11/05/2016   AST 30 07/31/2021   ALT 28 07/31/2021   PROT 7.4 07/31/2021   ALBUMIN 4.3 11/05/2016   CALCIUM 10.1 07/31/2021   GFRAA 98 12/17/2017    Speciality Comments: Dexa: 10/11/2022 Osteopenia T-Score -1.7 BMD 0.865.  Previous reclast  IV: 11/09/2016, 12/06/2017, 08/18/2021.  Procedures:  No procedures performed Allergies: Cymbalta [duloxetine hcl] and Lyrica [pregabalin]   Assessment / Plan:     Visit Diagnoses: Chronic left shoulder pain - Patient underwent rotator cuff repair in the past.  S/p left subacromial cortisone injection on 12/06/2022 and December 05, 2023. Left glenohumeral  injection performed on 06/07/23.  She states she  has some discomfort recently in her left shoulder but the discomfort is getting better itself.  S/P rotator cuff repair - Bilateral- Performed by Dr. Addie.  Primary osteoarthritis of both hands-she had bilateral PIP and DIP thickening with incomplete extension of the PIP and DIP joints.  Mucinous cyst was noted over the right third PIP joint which is asymptomatic.  Joint protection muscle strengthening was discussed.  Trochanteric bursitis of both hips-she has intermittent discomfort in the trochanteric region.  IT band stretches were demonstrated.  Primary osteoarthritis of both knees-she had good response to Visco supplement injections given on December 05, 2023.  She has good range of motion of bilateral knee joints without any warmth swelling or effusion.  Primary osteoarthritis of both feet - Underwent right great toe fusion and decompression of neuroma third interspace performed by Dr. Gershon on 10/24/2022.  DDD (degenerative disc disease), cervical-status post fusion.  She had limited range of motion of the cervical spine.  DDD (degenerative disc disease), thoracic-she had no tenderness over thoracic region.  Degeneration of intervertebral disc of lumbar region without discogenic back pain or lower extremity pain - She had a radiofrequency ablation performed on 06/22/20.Repeat epidural steroid injection on 11/11/2023.  She is scheduled to have another epidural injection.  Age-related osteoporosis without current pathological fracture - 2/12024 DEXA scan T-score -1.7, BMD 0.865 AP spine, 5% change compared to 2022.  Calcium rich diet and vitamin D  was discussed.  She has been exercising on a regular basis.  Medication monitoring encounter - Previous reclast  IV: 11/09/2016, 12/06/2017, 08/18/2021.Patient is currently holding IV Reclast  until her next DEXA which is scheduled for February 2026.  Vitamin D  deficiency-she had been taking vitamin  D.  Dyslipidemia-she is on Crestor.  History of peptic ulcer disease-she avoid NSAIDs and currently taking tramadol for lower back pain.  Interstitial cystitis  Orders: Orders Placed This Encounter  Procedures   DG Bone Density   No orders of the defined types were placed in this encounter.   Follow-Up Instructions: Return in about 6 months (around 12/15/2024) for Osteoarthritis.   Maya Nash, MD  Note - This record has been created using Animal nutritionist.  Chart creation errors have been sought, but may not always  have been located. Such creation errors do not reflect on  the standard of medical care.

## 2024-06-16 ENCOUNTER — Ambulatory Visit: Attending: Rheumatology | Admitting: Rheumatology

## 2024-06-16 ENCOUNTER — Encounter: Payer: Self-pay | Admitting: Rheumatology

## 2024-06-16 VITALS — BP 129/74 | HR 64 | Temp 97.3°F | Resp 14 | Ht 64.5 in | Wt 127.0 lb

## 2024-06-16 DIAGNOSIS — G8929 Other chronic pain: Secondary | ICD-10-CM

## 2024-06-16 DIAGNOSIS — M5030 Other cervical disc degeneration, unspecified cervical region: Secondary | ICD-10-CM

## 2024-06-16 DIAGNOSIS — Z5181 Encounter for therapeutic drug level monitoring: Secondary | ICD-10-CM

## 2024-06-16 DIAGNOSIS — Z9889 Other specified postprocedural states: Secondary | ICD-10-CM

## 2024-06-16 DIAGNOSIS — M7062 Trochanteric bursitis, left hip: Secondary | ICD-10-CM

## 2024-06-16 DIAGNOSIS — M19042 Primary osteoarthritis, left hand: Secondary | ICD-10-CM

## 2024-06-16 DIAGNOSIS — M19072 Primary osteoarthritis, left ankle and foot: Secondary | ICD-10-CM

## 2024-06-16 DIAGNOSIS — M7061 Trochanteric bursitis, right hip: Secondary | ICD-10-CM

## 2024-06-16 DIAGNOSIS — M5134 Other intervertebral disc degeneration, thoracic region: Secondary | ICD-10-CM

## 2024-06-16 DIAGNOSIS — M25512 Pain in left shoulder: Secondary | ICD-10-CM | POA: Diagnosis not present

## 2024-06-16 DIAGNOSIS — Z8711 Personal history of peptic ulcer disease: Secondary | ICD-10-CM

## 2024-06-16 DIAGNOSIS — M19041 Primary osteoarthritis, right hand: Secondary | ICD-10-CM | POA: Diagnosis not present

## 2024-06-16 DIAGNOSIS — M51369 Other intervertebral disc degeneration, lumbar region without mention of lumbar back pain or lower extremity pain: Secondary | ICD-10-CM

## 2024-06-16 DIAGNOSIS — N3010 Interstitial cystitis (chronic) without hematuria: Secondary | ICD-10-CM

## 2024-06-16 DIAGNOSIS — E559 Vitamin D deficiency, unspecified: Secondary | ICD-10-CM

## 2024-06-16 DIAGNOSIS — M810 Age-related osteoporosis without current pathological fracture: Secondary | ICD-10-CM

## 2024-06-16 DIAGNOSIS — M19071 Primary osteoarthritis, right ankle and foot: Secondary | ICD-10-CM

## 2024-06-16 DIAGNOSIS — E785 Hyperlipidemia, unspecified: Secondary | ICD-10-CM

## 2024-06-16 DIAGNOSIS — M170 Bilateral primary osteoarthritis of knee: Secondary | ICD-10-CM

## 2024-06-17 ENCOUNTER — Other Ambulatory Visit: Payer: Self-pay

## 2024-06-17 ENCOUNTER — Ambulatory Visit: Admitting: Physical Medicine and Rehabilitation

## 2024-06-17 VITALS — BP 131/78 | HR 60

## 2024-06-17 DIAGNOSIS — M5416 Radiculopathy, lumbar region: Secondary | ICD-10-CM | POA: Diagnosis not present

## 2024-06-17 MED ORDER — METHYLPREDNISOLONE ACETATE 80 MG/ML IJ SUSP
40.0000 mg | Freq: Once | INTRAMUSCULAR | Status: AC
  Administered 2024-06-17: 40 mg

## 2024-06-17 NOTE — Progress Notes (Signed)
 Pain Scale   Average Pain 6 Patient advising she has chronic lower back pain radiating to Left leg at time both legs.         +Driver, -BT, -Dye Allergies.

## 2024-06-24 ENCOUNTER — Other Ambulatory Visit: Payer: Self-pay | Admitting: Rheumatology

## 2024-06-24 NOTE — Telephone Encounter (Signed)
 Last Fill: 02/20/2024  Next Visit: 12/15/2024  Last Visit: 06/16/2024  Dx: Chronic left shoulder pain   Current Dose per office note on 06/16/2024: not discussed  Okay to refill Tizanidine ?

## 2024-06-29 NOTE — Procedures (Signed)
 S1 Lumbosacral Transforaminal Epidural Steroid Injection - Sub-Pedicular Approach with Fluoroscopic Guidance   Patient: Brenda Gray      Date of Birth: 22-Nov-1960 MRN: [id number] PCP: Yolande Toribio MATSU, MD      Visit Date: 06/17/2024   Universal Protocol:    Date/Time: 10/20/256:09 AM  Consent Given By: the patient  Position:  PRONE  Additional Comments: Vital signs were monitored before and after the procedure. Patient was prepped and draped in the usual sterile fashion. The correct patient, procedure, and site was verified.   Injection Procedure Details:  Procedure Site One Meds Administered:  Meds ordered this encounter  Medications   methylPREDNISolone  acetate (DEPO-MEDROL ) injection 40 mg    Laterality: Bilateral  Location/Site:  S1 Foramen   Needle size: 22 ga.  Needle type: Spinal  Needle Placement: Transforaminal  Findings:   -Comments: Excellent flow of contrast along the nerve, nerve root and into the epidural space.  Epidurogram: Contrast epidurogram showed no nerve root cut off or restricted flow pattern.  Procedure Details: After squaring off the sacral end-plate to get a true AP view, the C-arm was positioned so that the best possible view of the S1 foramen was visualized. The soft tissues overlying this structure were infiltrated with 2-3 ml. of 1% Lidocaine  without Epinephrine .    The spinal needle was inserted toward the target using a trajectory view along the fluoroscope beam.  Under AP and lateral visualization, the needle was advanced so it did not puncture dura. Biplanar projections were used to confirm position. Aspiration was confirmed to be negative for CSF and/or blood. A 1-2 ml. volume of Isovue -250 was injected and flow of contrast was noted at each level. Radiographs were obtained for documentation purposes.   After attaining the desired flow of contrast documented above, a 0.5 to 1.0 ml test dose of 0.25% Marcaine  was injected  into each respective transforaminal space.  The patient was observed for 90 seconds post injection.  After no sensory deficits were reported, and normal lower extremity motor function was noted,   the above injectate was administered so that equal amounts of the injectate were placed at each foramen (level) into the transforaminal epidural space.   Additional Comments:  The patient tolerated the procedure well Dressing: Band-Aid with 2 x 2 sterile gauze    Post-procedure details: Patient was observed during the procedure. Post-procedure instructions were reviewed.  Patient left the clinic in stable condition.

## 2024-06-29 NOTE — Progress Notes (Signed)
 Brenda Gray - 63 y.o. female MRN [id number]  Date of birth: 1961-04-22  Office Visit Note: Visit Date: 06/17/2024 PCP: Yolande Toribio MATSU, MD Referred by: Yolande Toribio MATSU, MD  Subjective: Chief Complaint  Patient presents with   Lower Back - Pain   HPI:  Brenda Gray is a 63 y.o. female who comes in today for planned repeat Bilateral S1-2  Lumbar Transforaminal epidural steroid injection with fluoroscopic guidance.  The patient has failed conservative care including home exercise, medications, time and activity modification.  This injection will be diagnostic and hopefully therapeutic.  Please see requesting physician notes for further details and justification. Patient received more than 50% pain relief from prior injection. Consider repeat RFA.   Referring: Duwaine Pouch, FNP   ROS Otherwise per HPI.  Assessment & Plan: Visit Diagnoses:    ICD-10-CM   1. Lumbar radiculopathy  M54.16 XR C-ARM NO REPORT    Epidural Steroid injection    methylPREDNISolone  acetate (DEPO-MEDROL ) injection 40 mg      Plan: No additional findings.   Meds & Orders:  Meds ordered this encounter  Medications   methylPREDNISolone  acetate (DEPO-MEDROL ) injection 40 mg    Orders Placed This Encounter  Procedures   XR C-ARM NO REPORT   Epidural Steroid injection    Follow-up: Return for visit to requesting provider as needed.   Procedures: No procedures performed  S1 Lumbosacral Transforaminal Epidural Steroid Injection - Sub-Pedicular Approach with Fluoroscopic Guidance   Patient: Brenda Gray      Date of Birth: 1961/03/20 MRN: [id number] PCP: Yolande Toribio MATSU, MD      Visit Date: 06/17/2024   Universal Protocol:    Date/Time: 10/20/256:09 AM  Consent Given By: the patient  Position:  PRONE  Additional Comments: Vital signs were monitored before and after the procedure. Patient was prepped and draped in the usual sterile fashion. The correct patient,  procedure, and site was verified.   Injection Procedure Details:  Procedure Site One Meds Administered:  Meds ordered this encounter  Medications   methylPREDNISolone  acetate (DEPO-MEDROL ) injection 40 mg    Laterality: Bilateral  Location/Site:  S1 Foramen   Needle size: 22 ga.  Needle type: Spinal  Needle Placement: Transforaminal  Findings:   -Comments: Excellent flow of contrast along the nerve, nerve root and into the epidural space.  Epidurogram: Contrast epidurogram showed no nerve root cut off or restricted flow pattern.  Procedure Details: After squaring off the sacral end-plate to get a true AP view, the C-arm was positioned so that the best possible view of the S1 foramen was visualized. The soft tissues overlying this structure were infiltrated with 2-3 ml. of 1% Lidocaine  without Epinephrine .    The spinal needle was inserted toward the target using a trajectory view along the fluoroscope beam.  Under AP and lateral visualization, the needle was advanced so it did not puncture dura. Biplanar projections were used to confirm position. Aspiration was confirmed to be negative for CSF and/or blood. A 1-2 ml. volume of Isovue -250 was injected and flow of contrast was noted at each level. Radiographs were obtained for documentation purposes.   After attaining the desired flow of contrast documented above, a 0.5 to 1.0 ml test dose of 0.25% Marcaine  was injected into each respective transforaminal space.  The patient was observed for 90 seconds post injection.  After no sensory deficits were reported, and normal lower extremity motor function was noted,   the above injectate was administered so  that equal amounts of the injectate were placed at each foramen (level) into the transforaminal epidural space.   Additional Comments:  The patient tolerated the procedure well Dressing: Band-Aid with 2 x 2 sterile gauze    Post-procedure details: Patient was observed during  the procedure. Post-procedure instructions were reviewed.  Patient left the clinic in stable condition.   Clinical History: CLINICAL DATA:  Lumbar spondylosis with radiculopathy. Lumbar facet arthropathy. Chronic low back pain radiating to both sides with numbness and weakness.   EXAM: MRI LUMBAR SPINE WITHOUT CONTRAST   TECHNIQUE: Multiplanar, multisequence MR imaging of the lumbar spine was performed. No intravenous contrast was administered.   COMPARISON:  Lumbar spine MRI 01/06/2010   FINDINGS: Segmentation:  Standard.   Alignment:  Mild lumbar dextroscoliosis.  No significant listhesis.   Vertebrae: No fracture, suspicious marrow lesion, or significant marrow edema. Small Schmorl's nodes in the lower thoracic spine. Asymmetric right-sided chronic degenerative endplate changes at L5-S1.   Conus medullaris and cauda equina: Conus extends to the lower L1 level. Conus and cauda equina appear normal.   Paraspinal and other soft tissues: Unremarkable.   Disc levels:   Disc desiccation throughout the lumbar spine. Mild disc space narrowing from L1-2 to L3-4 and at L5-S1. Moderate disc space narrowing at T12-L1.   T12-L1: Mild disc bulging and endplate spurring without stenosis, unchanged.   L1-2: Progressive disc degeneration. Disc bulging results in mild left lateral recess stenosis without spinal or neural foraminal stenosis.   L2-3: Progressive disc degeneration. Disc bulging and mild facet and ligamentum flavum hypertrophy result in mild left greater than right lateral recess stenosis without spinal or neural foraminal stenosis.   L3-4: Progressive disc degeneration. Disc bulging and mild facet and ligamentum flavum hypertrophy result in mild right and mild-to-moderate left lateral recess stenosis and mild left neural foraminal stenosis without significant spinal stenosis.   L4-5: Disc bulging and mild-to-moderate facet and ligamentum flavum hypertrophy  result in mild-to-moderate bilateral lateral recess stenosis, mildly progressed from prior. No significant spinal or neural foraminal stenosis.   L5-S1: Progressive disc degeneration. Disc bulging and mild-to-moderate right and mild left facet and ligamentum flavum hypertrophy result in mild right neural foraminal stenosis without spinal stenosis.   IMPRESSION: 1. Progressive lumbar disc and facet degeneration since 2011. 2. Mild-to-moderate lateral recess stenosis at L3-4 and L4-5. 3. Mild neural foraminal stenosis on the left at L3-4 and on the right at L5-S1.     Electronically Signed   By: Dasie Hamburg M.D.   On: 06/04/2022 10:29     Objective:  VS:  HT:    WT:   BMI:     BP:131/78  HR:60bpm  TEMP: ( )  RESP:  Physical Exam Vitals and nursing note reviewed.  Constitutional:      General: She is not in acute distress.    Appearance: Normal appearance. She is not ill-appearing.  HENT:     Head: Normocephalic and atraumatic.     Right Ear: External ear normal.     Left Ear: External ear normal.  Eyes:     Extraocular Movements: Extraocular movements intact.  Cardiovascular:     Rate and Rhythm: Normal rate.     Pulses: Normal pulses.  Pulmonary:     Effort: Pulmonary effort is normal. No respiratory distress.  Abdominal:     General: There is no distension.     Palpations: Abdomen is soft.  Musculoskeletal:        General: Tenderness present.  Cervical back: Neck supple.     Right lower leg: No edema.     Left lower leg: No edema.     Comments: Patient has good distal strength with no pain over the greater trochanters.  No clonus or focal weakness.  Skin:    Findings: No erythema, lesion or rash.  Neurological:     General: No focal deficit present.     Mental Status: She is alert and oriented to person, place, and time.     Sensory: No sensory deficit.     Motor: No weakness or abnormal muscle tone.     Coordination: Coordination normal.   Psychiatric:        Mood and Affect: Mood normal.        Behavior: Behavior normal.      Imaging: No results found.

## 2024-07-13 ENCOUNTER — Encounter: Payer: Self-pay | Admitting: Radiology

## 2024-09-23 ENCOUNTER — Telehealth: Payer: Self-pay

## 2024-09-23 NOTE — Telephone Encounter (Signed)
 Patient contacted the office and states she is going to schedule her bone density with her upcomming mammogram appointment. Advised patient I would make a note in her chart.

## 2024-12-15 ENCOUNTER — Ambulatory Visit: Admitting: Rheumatology
